# Patient Record
Sex: Male | Born: 1965 | Race: White | Hispanic: No | Marital: Married
Health system: Southern US, Academic
[De-identification: ages and names within clinical notes are randomized; demographics above are authoritative.]

## PROBLEM LIST (undated history)

## (undated) ENCOUNTER — Telehealth
Attending: Student in an Organized Health Care Education/Training Program | Primary: Student in an Organized Health Care Education/Training Program

## (undated) ENCOUNTER — Ambulatory Visit: Payer: PRIVATE HEALTH INSURANCE

## (undated) ENCOUNTER — Encounter: Attending: Oncology | Primary: Oncology

## (undated) ENCOUNTER — Encounter

## (undated) ENCOUNTER — Ambulatory Visit
Payer: PRIVATE HEALTH INSURANCE | Attending: Student in an Organized Health Care Education/Training Program | Primary: Student in an Organized Health Care Education/Training Program

## (undated) ENCOUNTER — Telehealth

## (undated) ENCOUNTER — Encounter: Attending: Adult Health | Primary: Adult Health

## (undated) ENCOUNTER — Encounter
Attending: Pharmacist Clinician (PhC)/ Clinical Pharmacy Specialist | Primary: Pharmacist Clinician (PhC)/ Clinical Pharmacy Specialist

## (undated) ENCOUNTER — Ambulatory Visit

## (undated) ENCOUNTER — Telehealth: Attending: Oncology | Primary: Oncology

## (undated) ENCOUNTER — Encounter: Attending: Nurse Practitioner | Primary: Nurse Practitioner

## (undated) ENCOUNTER — Ambulatory Visit: Payer: PRIVATE HEALTH INSURANCE | Attending: Registered" | Primary: Registered"

## (undated) ENCOUNTER — Ambulatory Visit: Attending: Radiation Oncology | Primary: Radiation Oncology

## (undated) ENCOUNTER — Encounter
Attending: Student in an Organized Health Care Education/Training Program | Primary: Student in an Organized Health Care Education/Training Program

## (undated) ENCOUNTER — Encounter: Attending: Family | Primary: Family

## (undated) ENCOUNTER — Inpatient Hospital Stay

## (undated) ENCOUNTER — Encounter: Attending: Primary Care | Primary: Primary Care

## (undated) ENCOUNTER — Encounter: Attending: Radiation Oncology | Primary: Radiation Oncology

## (undated) ENCOUNTER — Ambulatory Visit: Payer: PRIVATE HEALTH INSURANCE | Attending: Oncology | Primary: Oncology

## (undated) ENCOUNTER — Encounter: Attending: Diagnostic Radiology | Primary: Diagnostic Radiology

## (undated) ENCOUNTER — Ambulatory Visit: Attending: Clinical | Primary: Clinical

## (undated) ENCOUNTER — Ambulatory Visit: Attending: Hematology & Oncology | Primary: Hematology & Oncology

## (undated) ENCOUNTER — Ambulatory Visit: Payer: PRIVATE HEALTH INSURANCE | Attending: Family | Primary: Family

## (undated) ENCOUNTER — Encounter: Attending: Medical Oncology | Primary: Medical Oncology

## (undated) ENCOUNTER — Ambulatory Visit: Payer: PRIVATE HEALTH INSURANCE | Attending: Primary Care | Primary: Primary Care

## (undated) ENCOUNTER — Telehealth: Attending: Radiation Oncology | Primary: Radiation Oncology

## (undated) ENCOUNTER — Inpatient Hospital Stay: Payer: PRIVATE HEALTH INSURANCE

## (undated) ENCOUNTER — Telehealth: Attending: Primary Care | Primary: Primary Care

## (undated) ENCOUNTER — Ambulatory Visit: Payer: PRIVATE HEALTH INSURANCE | Attending: Radiation Oncology | Primary: Radiation Oncology

## (undated) DIAGNOSIS — K4090 Unilateral inguinal hernia, without obstruction or gangrene, not specified as recurrent: Secondary | ICD-10-CM

## (undated) DIAGNOSIS — N400 Enlarged prostate without lower urinary tract symptoms: Secondary | ICD-10-CM

## (undated) DIAGNOSIS — Z9221 Personal history of antineoplastic chemotherapy: Secondary | ICD-10-CM

## (undated) DIAGNOSIS — F32A Depression, unspecified: Secondary | ICD-10-CM

## (undated) DIAGNOSIS — F419 Anxiety disorder, unspecified: Secondary | ICD-10-CM

## (undated) DIAGNOSIS — C8590 Non-Hodgkin lymphoma, unspecified, unspecified site: Secondary | ICD-10-CM

## (undated) DIAGNOSIS — K219 Gastro-esophageal reflux disease without esophagitis: Secondary | ICD-10-CM

## (undated) DIAGNOSIS — Z923 Personal history of irradiation: Secondary | ICD-10-CM

## (undated) HISTORY — PX: CRANIOTOMY: SHX93

---

## 2002-09-30 ENCOUNTER — Encounter: Payer: Self-pay | Admitting: Specialist

## 2002-09-30 ENCOUNTER — Encounter: Admission: RE | Admit: 2002-09-30 | Discharge: 2002-09-30 | Payer: Self-pay | Admitting: Specialist

## 2016-11-04 ENCOUNTER — Other Ambulatory Visit (HOSPITAL_COMMUNITY): Payer: Self-pay | Admitting: Preventative Medicine

## 2016-11-04 DIAGNOSIS — R319 Hematuria, unspecified: Secondary | ICD-10-CM

## 2016-11-06 ENCOUNTER — Ambulatory Visit (HOSPITAL_COMMUNITY)
Admission: RE | Admit: 2016-11-06 | Discharge: 2016-11-06 | Disposition: A | Discharge status: Home / Self Care | Payer: Self-pay | Source: Ambulatory Visit | Attending: Preventative Medicine | Admitting: Preventative Medicine

## 2016-11-06 DIAGNOSIS — N400 Enlarged prostate without lower urinary tract symptoms: Secondary | ICD-10-CM | POA: Insufficient documentation

## 2016-11-06 DIAGNOSIS — R319 Hematuria, unspecified: Secondary | ICD-10-CM | POA: Insufficient documentation

## 2016-11-06 DIAGNOSIS — R1031 Right lower quadrant pain: Secondary | ICD-10-CM | POA: Insufficient documentation

## 2016-11-06 DIAGNOSIS — I700 Atherosclerosis of aorta: Secondary | ICD-10-CM | POA: Insufficient documentation

## 2016-11-06 DIAGNOSIS — K4090 Unilateral inguinal hernia, without obstruction or gangrene, not specified as recurrent: Secondary | ICD-10-CM | POA: Insufficient documentation

## 2016-11-06 DIAGNOSIS — E278 Other specified disorders of adrenal gland: Secondary | ICD-10-CM | POA: Insufficient documentation

## 2017-01-21 ENCOUNTER — Ambulatory Visit (INDEPENDENT_AMBULATORY_CARE_PROVIDER_SITE_OTHER): Payer: No Typology Code available for payment source | Admitting: Urology

## 2017-01-21 DIAGNOSIS — R3129 Other microscopic hematuria: Secondary | ICD-10-CM | POA: Diagnosis not present

## 2017-01-28 ENCOUNTER — Other Ambulatory Visit: Payer: Self-pay | Admitting: Urology

## 2017-01-28 DIAGNOSIS — R3129 Other microscopic hematuria: Secondary | ICD-10-CM

## 2017-02-27 ENCOUNTER — Ambulatory Visit (HOSPITAL_COMMUNITY): Payer: No Typology Code available for payment source

## 2017-03-12 ENCOUNTER — Ambulatory Visit (HOSPITAL_COMMUNITY): Payer: No Typology Code available for payment source

## 2017-03-31 ENCOUNTER — Ambulatory Visit (HOSPITAL_COMMUNITY): Payer: No Typology Code available for payment source

## 2017-04-29 ENCOUNTER — Ambulatory Visit: Payer: Self-pay | Admitting: Urology

## 2017-05-15 ENCOUNTER — Ambulatory Visit (HOSPITAL_COMMUNITY): Payer: Self-pay

## 2017-06-10 ENCOUNTER — Ambulatory Visit (HOSPITAL_COMMUNITY): Payer: Self-pay

## 2017-08-19 ENCOUNTER — Ambulatory Visit (HOSPITAL_COMMUNITY): Admission: RE | Admit: 2017-08-19 | Payer: Self-pay | Source: Ambulatory Visit

## 2017-10-28 ENCOUNTER — Ambulatory Visit: Payer: BLUE CROSS/BLUE SHIELD | Admitting: Urology

## 2017-11-06 ENCOUNTER — Ambulatory Visit (HOSPITAL_COMMUNITY): Payer: BLUE CROSS/BLUE SHIELD

## 2017-11-10 ENCOUNTER — Ambulatory Visit (HOSPITAL_COMMUNITY)
Admission: RE | Admit: 2017-11-10 | Discharge: 2017-11-10 | Disposition: A | Discharge status: Home / Self Care | Payer: BLUE CROSS/BLUE SHIELD | Source: Ambulatory Visit | Attending: Urology | Admitting: Urology

## 2017-11-10 ENCOUNTER — Ambulatory Visit (HOSPITAL_COMMUNITY): Payer: BLUE CROSS/BLUE SHIELD

## 2017-11-10 DIAGNOSIS — N400 Enlarged prostate without lower urinary tract symptoms: Secondary | ICD-10-CM | POA: Diagnosis not present

## 2017-11-10 DIAGNOSIS — K4090 Unilateral inguinal hernia, without obstruction or gangrene, not specified as recurrent: Secondary | ICD-10-CM | POA: Insufficient documentation

## 2017-11-10 DIAGNOSIS — R3129 Other microscopic hematuria: Secondary | ICD-10-CM | POA: Insufficient documentation

## 2017-11-10 MED ORDER — IOPAMIDOL (ISOVUE-300) INJECTION 61%
100.0000 mL | Freq: Once | INTRAVENOUS | Status: AC | PRN
  Administered 2017-11-10: 125 mL via INTRAVENOUS

## 2017-11-11 ENCOUNTER — Ambulatory Visit: Payer: BLUE CROSS/BLUE SHIELD | Admitting: Urology

## 2017-11-11 DIAGNOSIS — R3129 Other microscopic hematuria: Secondary | ICD-10-CM | POA: Diagnosis not present

## 2018-08-11 ENCOUNTER — Ambulatory Visit (INDEPENDENT_AMBULATORY_CARE_PROVIDER_SITE_OTHER): Payer: Self-pay | Admitting: Urology

## 2018-08-11 DIAGNOSIS — N5201 Erectile dysfunction due to arterial insufficiency: Secondary | ICD-10-CM

## 2018-08-11 DIAGNOSIS — R3129 Other microscopic hematuria: Secondary | ICD-10-CM

## 2019-05-27 IMAGING — CT CT ABD-PEL WO/W CM
3 of 12 series · 12 of 46 positions shown, 18 images · IV contrast (iopamidol)
Comparison: 11/06/2016

CLINICAL DATA: Left-sided flank pain with microscopic hematuria for
1.5 years.

EXAM:
CT ABDOMEN AND PELVIS WITHOUT AND WITH CONTRAST
TECHNIQUE: Multidetector CT imaging of the abdomen and pelvis was performed
following the standard protocol before and following the bolus
administration of intravenous contrast.
CONTRAST:  125mL [id number] IOPAMIDOL ([id number]) INJECTION 61%

[Series 3: axial pre · axial · non-contrast · 0.74mm/px · z∈[-434,-49]mm · 7 of 103 slices shown, 12 images]
[im 13/103  soft-tissue]
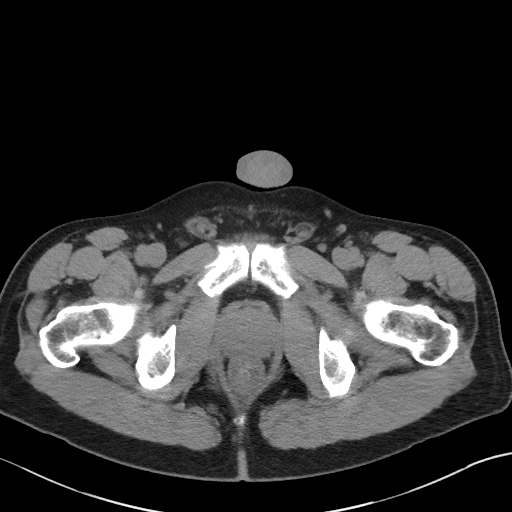
[im 13/103  bone]
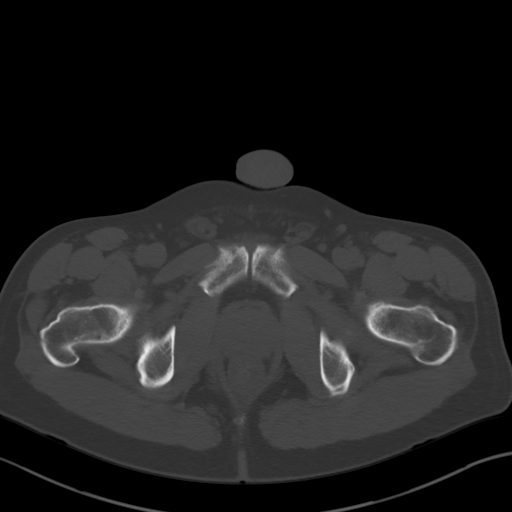
[im 26/103  soft-tissue]
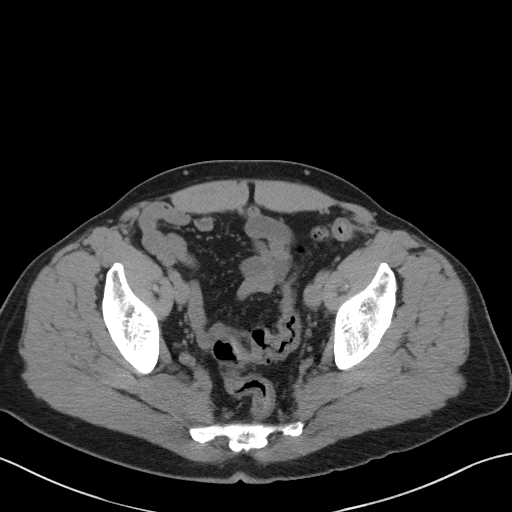
[im 39/103  soft-tissue]
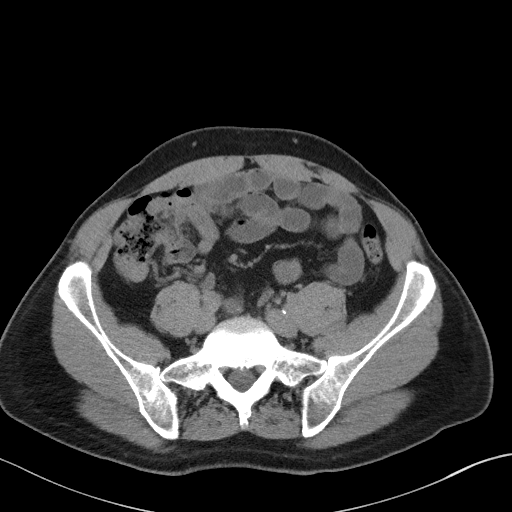
[im 52/103  soft-tissue]
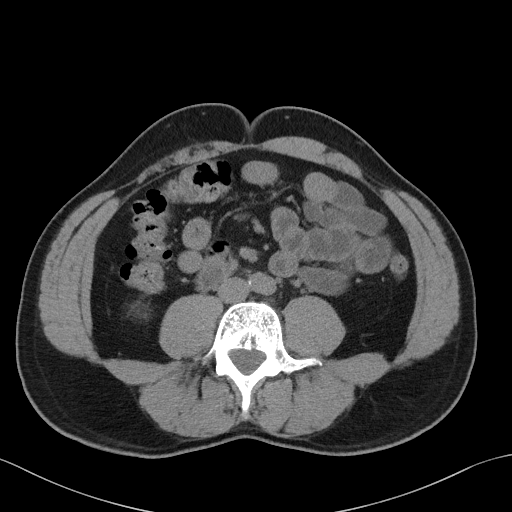
[im 52/103  lung]
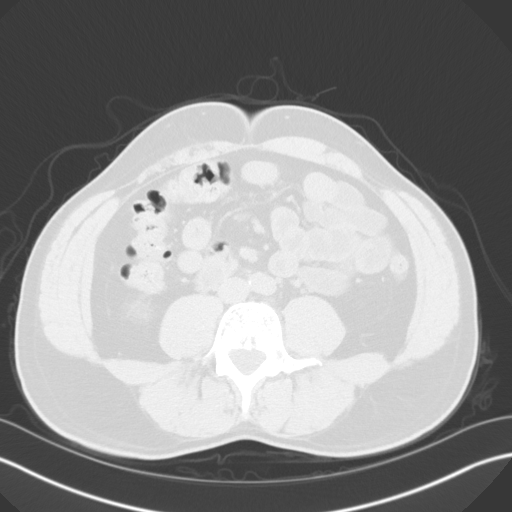
[im 64/103  soft-tissue]
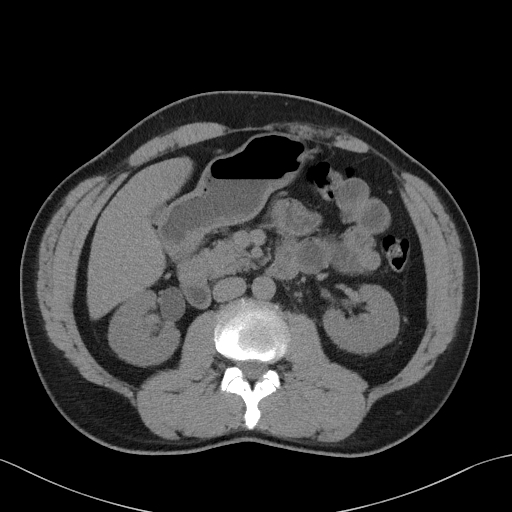
[im 64/103  lung]
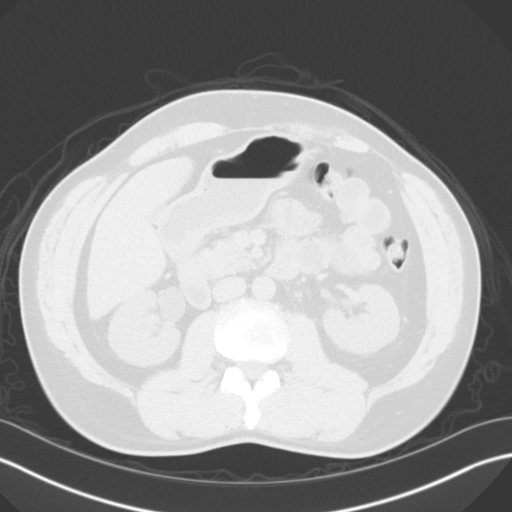
[im 77/103  soft-tissue]
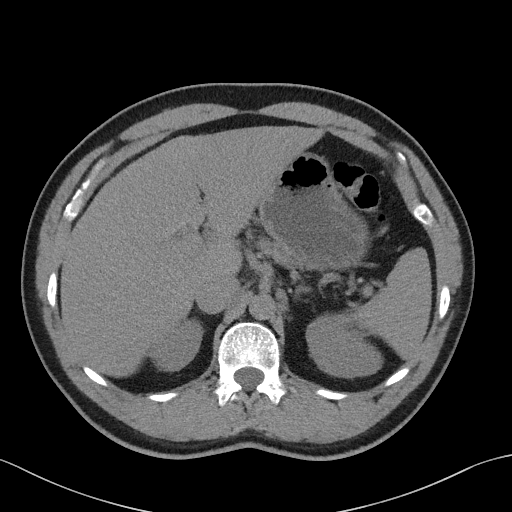
[im 77/103  lung]
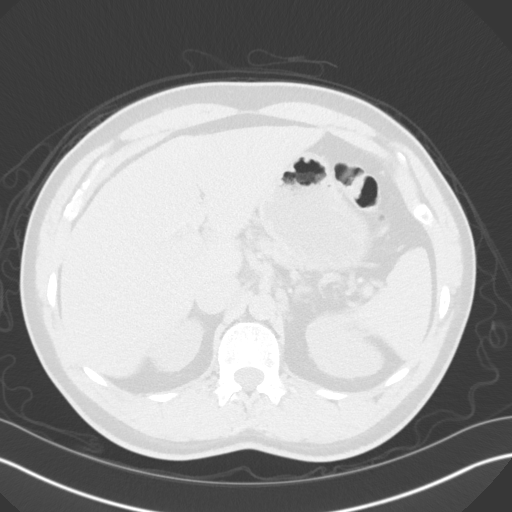
[im 90/103  soft-tissue]
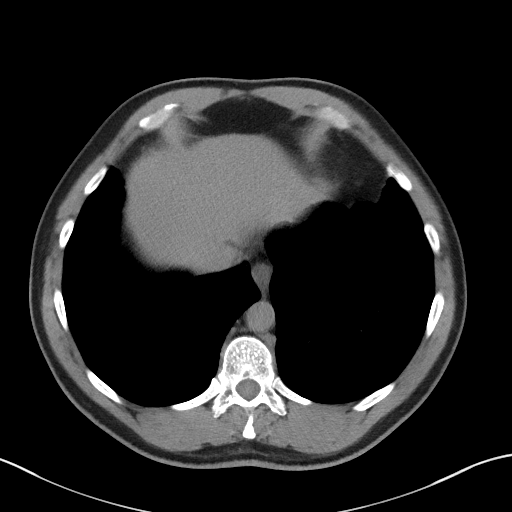
[im 90/103  lung]
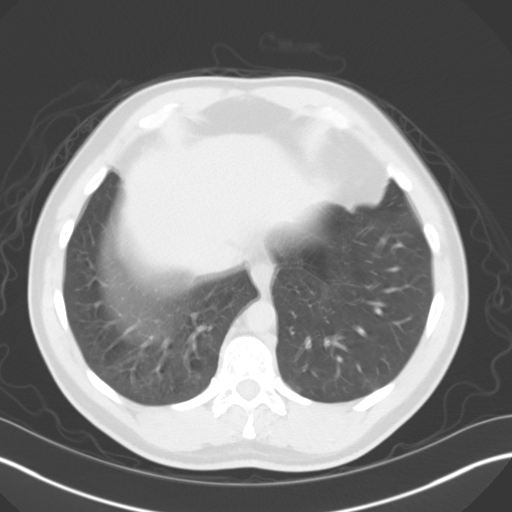

[Series 6: coronal pre · coronal · non-contrast · 0.74mm/px · 2 of 101 slices shown, 3 images]
[im 34/101  soft-tissue]
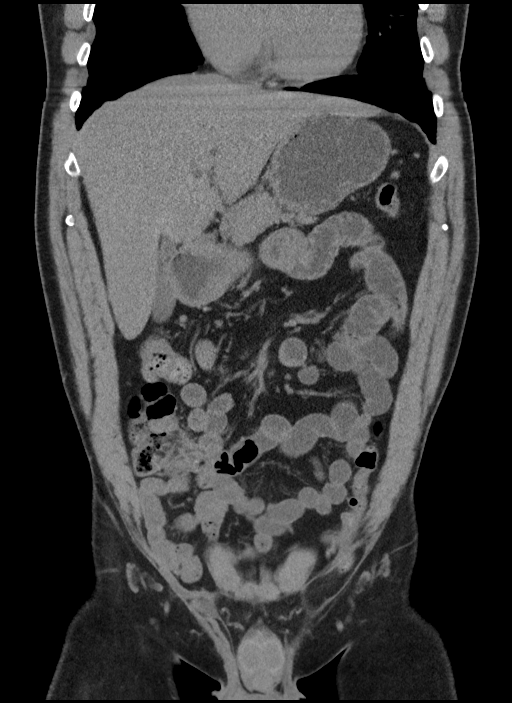
[im 34/101  bone]
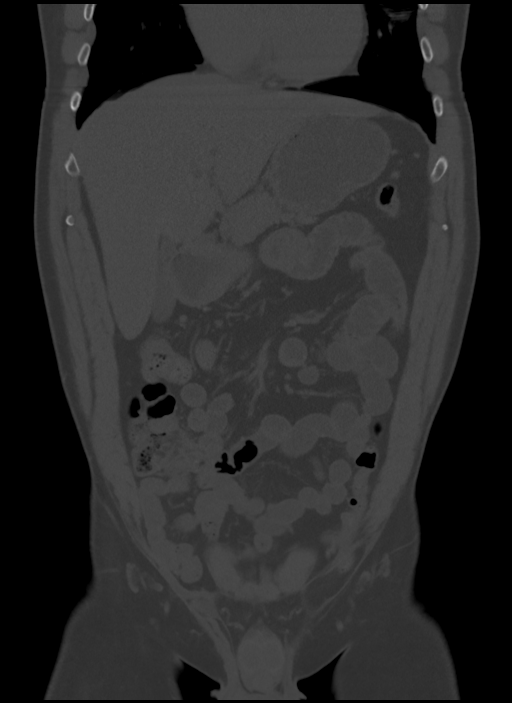
[im 67/101  soft-tissue]
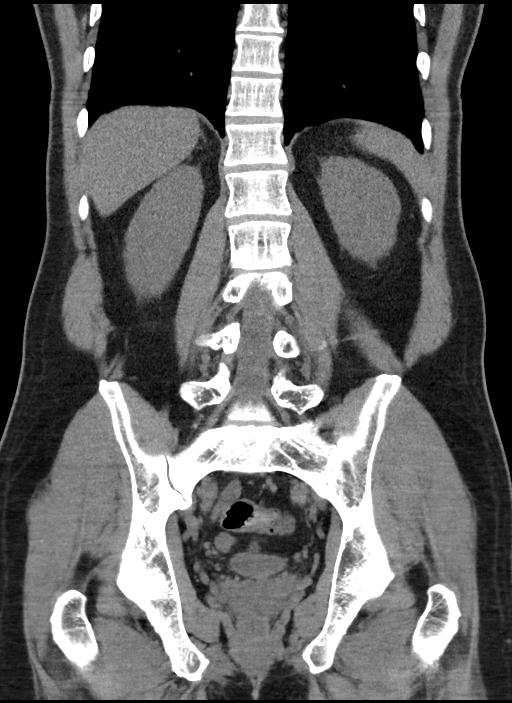

[Series 8: axial post · axial · 0.74mm/px · z∈[-424,-279]mm · 3 of 103 slices shown]
[im 15/103  soft-tissue]
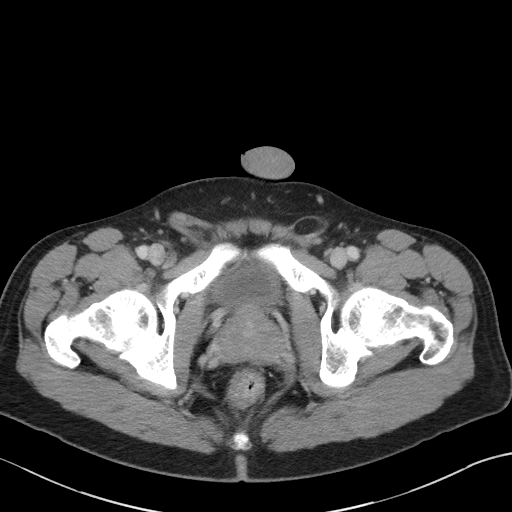
[im 30/103  soft-tissue]
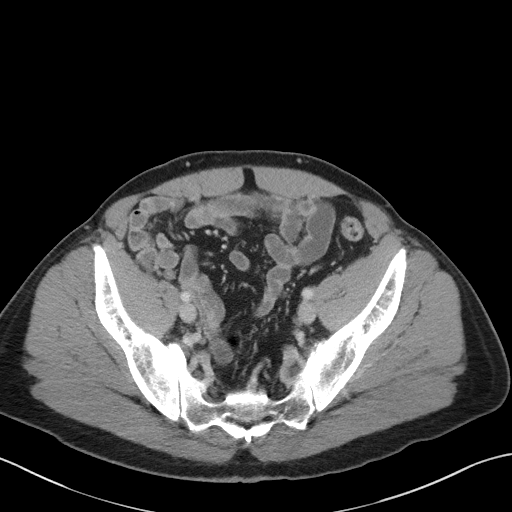
[im 44/103  soft-tissue]
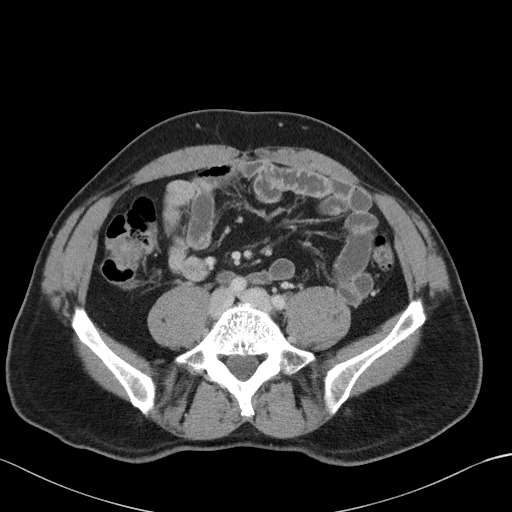

[12 of 46 positions shown; findings below may reference images not displayed]

FINDINGS: Lower chest: Scarring in the right middle lobe and lingula. No acute
abnormality identified.

Hepatobiliary: No focal liver abnormality is seen. No gallstones,
gallbladder wall thickening, or biliary dilatation.

Pancreas: Unremarkable. No pancreatic ductal dilatation or
surrounding inflammatory changes.

Spleen: Normal in size without focal abnormality.

Adrenals/Urinary Tract: Normal adrenal glands. The right kidney
appears normal. No right nephrolithiasis, hydronephrosis or mass. No
left renal calculi or hydronephrosis. Subcentimeter low-attenuation
foci within the left kidney are too small to reliably characterize.
Urinary bladder appears within normal limits.

Stomach/Bowel: The stomach appears normal. No abnormal dilatation of
the small bowel loops. Normal unremarkable appearance of the colon..

Vascular/Lymphatic: Mild aortic atherosclerosis. No aneurysm. No
abdominal adenopathy. No pelvic or inguinal adenopathy.

Reproductive: Prostate gland measures 5.3 by 3.9 by 4.1 cm (volume =
44 cm^3)

Other: Left inguinal hernia contains a nonobstructed loop of sigmoid
colon.

Musculoskeletal: No acute or significant osseous findings.
IMPRESSION: 1. No acute findings identified within the abdomen or pelvis.
2. No renal calculi or other explanation for patient's hematuria.
3. Prostate gland enlargement.
4. Left inguinal hernia contains a nonobstructed loop of sigmoid
colon.

## 2019-06-08 ENCOUNTER — Ambulatory Visit: Payer: Self-pay | Admitting: Urology

## 2019-08-03 ENCOUNTER — Ambulatory Visit: Payer: Self-pay | Admitting: Urology

## 2019-09-07 ENCOUNTER — Ambulatory Visit (INDEPENDENT_AMBULATORY_CARE_PROVIDER_SITE_OTHER): Payer: Self-pay | Admitting: Urology

## 2019-09-07 DIAGNOSIS — R3121 Asymptomatic microscopic hematuria: Secondary | ICD-10-CM

## 2019-09-07 DIAGNOSIS — N529 Male erectile dysfunction, unspecified: Secondary | ICD-10-CM

## 2020-05-22 ENCOUNTER — Ambulatory Visit
Admission: EM | Admit: 2020-05-22 | Discharge: 2020-05-22 | Disposition: A | Discharge status: Home / Self Care | Payer: Self-pay

## 2020-05-22 ENCOUNTER — Ambulatory Visit (INDEPENDENT_AMBULATORY_CARE_PROVIDER_SITE_OTHER): Payer: Self-pay

## 2020-05-22 DIAGNOSIS — R252 Cramp and spasm: Secondary | ICD-10-CM

## 2020-05-22 DIAGNOSIS — R0602 Shortness of breath: Secondary | ICD-10-CM

## 2020-05-22 DIAGNOSIS — R059 Cough, unspecified: Secondary | ICD-10-CM

## 2020-05-22 DIAGNOSIS — R05 Cough: Secondary | ICD-10-CM

## 2020-05-22 NOTE — Discharge Instructions (Signed)
Unable to rule out cardiac disease or blood clot in urgent care setting.  Offered patient further evaluation and management in the ED.  Patient declines at this time and would like to try outpatient therapy first.  Aware of the risk associated with this decision including missed diagnosis, organ damage, organ failure, and/or death.  Patient aware and in agreement.     Blood work ordered.  We will follow up with you regarding abnormal results More than likely you will need to follow up with a PCP regarding abnormal lab results Chest x-ray without active cardiopulmonary disease, but did show COPD and aortic atherosclerosis EKG unremarkable Follow up with PCP for further evaluation and management Call 911 or go to the ED if you chest pain, worsening SOB, calf pain, redness, swelling, abdominal pain, etc..Marland Kitchen

## 2020-05-22 NOTE — ED Provider Notes (Signed)
[place]   [id number] 05/22/20 Arrival Time: 17   CC: Multiple complaints  SUBJECTIVE: HPI obtained from patient and family friend: Kaylub Detienne is a 54 y.o. male who presents with complaint of multiple complaints, including bilateral leg cramps, SOB, and cough x 2 weeks.  Denies a precipitating event.  Denies chest pain.  Describes symptoms as worsening.  Has tried OTC cough medications without relief.  Symptoms made worse with laying flat and at night time.  Denies previous symptoms in the past.  Reports hx of blood with straining BMs.  Denies fever, chills, lightheadedness, dizziness,  nausea, vomiting, abdominal pain, changes in bladder habits, peripheral edema, LE skin changes.  Reports calf pain, SOB, and tobacco abuse 3PPD x 15 years.  Denies recent long travel, recent surgery, malignancy, hormone use, or previous blood clot  ROS: As per HPI.  All other pertinent ROS negative.    History reviewed. No pertinent past medical history. History reviewed. No pertinent surgical history. No Known Allergies No current facility-administered medications on file prior to encounter.   Current Outpatient Medications on File Prior to Encounter  Medication Sig Dispense Refill  . finasteride (PROSCAR) 5 MG tablet Take 5 mg by mouth daily.    . sildenafil (REVATIO) 20 MG tablet Take 20 mg by mouth as needed.     Social History   Socioeconomic History  . Marital status: Single    Spouse name: Not on file  . Number of children: Not on file  . Years of education: Not on file  . Highest education level: Not on file  Occupational History  . Not on file  Tobacco Use  . Smoking status: Heavy Tobacco Smoker    Packs/day: 3.00  . Smokeless tobacco: Never Used  Substance and Sexual Activity  . Alcohol use: Not Currently  . Drug use: Not Currently  . Sexual activity: Not on file  Other Topics Concern  . Not on file  Social History Narrative  . Not on file   Social  Determinants of Health   Financial Resource Strain:   . Difficulty of Paying Living Expenses:   Food Insecurity:   . Worried About Charity fundraiser in the Last Year:   . Arboriculturist in the Last Year:   Transportation Needs:   . Film/video editor (Medical):   Marland Kitchen Lack of Transportation (Non-Medical):   Physical Activity:   . Days of Exercise per Week:   . Minutes of Exercise per Session:   Stress:   . Feeling of Stress :   Social Connections:   . Frequency of Communication with Friends and Family:   . Frequency of Social Gatherings with Friends and Family:   . Attends Religious Services:   . Active Member of Clubs or Organizations:   . Attends Archivist Meetings:   Marland Kitchen Marital Status:   Intimate Partner Violence:   . Fear of Current or Ex-Partner:   . Emotionally Abused:   Marland Kitchen Physically Abused:   . Sexually Abused:    Family History  Family history unknown: Yes     OBJECTIVE:  Vitals:   05/22/20 1128  BP: 126/86  Pulse: 79  Resp: 20  Temp: 98.3 F (36.8 C)  SpO2: 94%    General appearance: alert; no distress Eyes: PERRLA; EOMI; conjunctiva normal HENT: normocephalic; atraumatic Neck: supple Lungs: clear to auscultation bilaterally without adventitious breath sounds Heart: regular rate and rhythm.  Clear S1 and S2 without rubs, gallops, or  murmur. Abdomen: soft, non-tender; bowel sounds normal; no guarding Extremities: no cyanosis or edema; symmetrical with no gross deformities; posterior tibialis pulse 2+ and intact; negative homans Skin: warm and dry Psychological: alert and cooperative; normal mood and affect  ECG: Orders placed or performed during the hospital encounter of 05/22/20  . ED EKG  . ED EKG   EKG normal sinus rhythm without ST elevations, depressions, or prolonged PR interval.  No narrowing or widening of the QRS complexes.    LABS:  No results found for this or any previous visit. Labs Reviewed  COMPREHENSIVE METABOLIC  PANEL    DIAGNOSTIC STUDIES:  DG Chest 2 View  Result Date: 05/22/2020 CLINICAL DATA:  Shortness of breath.  Cough. EXAM: CHEST - 2 VIEW COMPARISON:  CT of the abdomen and pelvis 11/10/2017 FINDINGS: Heart size normal. Atherosclerotic changes are noted at the aortic arch. Changes of COPD are noted. Linear atelectasis or scarring is present in the lingula. Lungs are otherwise clear. The visualized soft tissues and bony thorax are unremarkable. IMPRESSION: 1. No acute cardiopulmonary disease or significant interval change. 2. COPD. 3. Aortic atherosclerosis. Electronically Signed   By: San Morelle M.D.   On: 05/22/2020 12:13    X-rays negative for cardiopulmonary disease  I have reviewed the x-rays myself and the radiologist interpretation. I am in agreement with the radiologist interpretation.     ASSESSMENT & PLAN:  1. Leg cramps   2. SOB (shortness of breath)   3. Cough     Unable to rule out cardiac disease or blood clot in urgent care setting.  Offered patient further evaluation and management in the ED.  Patient declines at this time and would like to try outpatient therapy first.  Aware of the risk associated with this decision including missed diagnosis, organ damage, organ failure, and/or death.  Patient aware and in agreement.     Blood work ordered.  We will follow up with you regarding abnormal results More than likely you will need to follow up with a PCP regarding abnormal lab results Chest x-ray without active cardiopulmonary disease, but did show COPD and aortic atherosclerosis EKG unremarkable Follow up with PCP for further evaluation and management Call 911 or go to the ED if you chest pain, worsening SOB, calf pain, redness, swelling, abdominal pain, etc...   Lestine Box, PA-C 05/22/20 1237

## 2020-05-22 NOTE — ED Triage Notes (Signed)
Pt presents with c/o bilateral left leg pain for past couple of days. Pt speaks broken english and is poor historian

## 2020-05-24 LAB — COMPREHENSIVE METABOLIC PANEL
ALT: 20 IU/L (ref 0–44)
AST: 16 IU/L (ref 0–40)
Albumin/Globulin Ratio: 1.9 (ref 1.2–2.2)
Albumin: 5 g/dL — ABNORMAL HIGH (ref 3.8–4.9)
Alkaline Phosphatase: 62 IU/L (ref 48–121)
BUN/Creatinine Ratio: 12 (ref 9–20)
BUN: 11 mg/dL (ref 6–24)
Bilirubin Total: 0.8 mg/dL (ref 0.0–1.2)
CO2: 26 mmol/L (ref 20–29)
Calcium: 9.4 mg/dL (ref 8.7–10.2)
Chloride: 102 mmol/L (ref 96–106)
Creatinine, Ser: 0.91 mg/dL (ref 0.76–1.27)
GFR calc Af Amer: 112 mL/min/{1.73_m2} (ref >59)
GFR calc non Af Amer: 97 mL/min/{1.73_m2} (ref >59)
Globulin, Total: 2.6 g/dL (ref 1.5–4.5)
Glucose: 117 mg/dL — ABNORMAL HIGH (ref 65–99)
Potassium: 4.8 mmol/L (ref 3.5–5.2)
Sodium: 140 mmol/L (ref 134–144)
Total Protein: 7.6 g/dL (ref 6.0–8.5)

## 2020-06-01 ENCOUNTER — Emergency Department
Admit: 2020-06-01 | Discharge: 2020-06-02 | Disposition: A | Discharge status: Other Acute Inpatient Hospital | Payer: PRIVATE HEALTH INSURANCE | Attending: Emergency Medicine

## 2020-06-01 ENCOUNTER — Ambulatory Visit
Admit: 2020-06-01 | Discharge: 2020-06-02 | Disposition: A | Discharge status: Other Acute Inpatient Hospital | Payer: PRIVATE HEALTH INSURANCE | Attending: Emergency Medicine

## 2020-06-01 DIAGNOSIS — D496 Neoplasm of unspecified behavior of brain: Principal | ICD-10-CM

## 2020-07-30 DIAGNOSIS — C8589 Other specified types of non-Hodgkin lymphoma, extranodal and solid organ sites: Principal | ICD-10-CM

## 2020-08-03 ENCOUNTER — Ambulatory Visit: Admit: 2020-08-03 | Discharge: 2020-08-04

## 2020-08-03 DIAGNOSIS — C8589 Other specified types of non-Hodgkin lymphoma, extranodal and solid organ sites: Principal | ICD-10-CM

## 2020-08-07 ENCOUNTER — Ambulatory Visit: Admit: 2020-08-07 | Discharge: 2020-08-08

## 2020-08-07 ENCOUNTER — Encounter: Admit: 2020-08-07 | Discharge: 2020-08-07

## 2020-08-07 DIAGNOSIS — C8589 Other specified types of non-Hodgkin lymphoma, extranodal and solid organ sites: Principal | ICD-10-CM

## 2020-08-07 DIAGNOSIS — G9389 Other specified disorders of brain: Principal | ICD-10-CM

## 2020-08-09 ENCOUNTER — Other Ambulatory Visit: Admit: 2020-08-09 | Discharge: 2020-08-10

## 2020-08-09 ENCOUNTER — Ambulatory Visit: Admit: 2020-08-09 | Discharge: 2020-08-10

## 2020-08-09 DIAGNOSIS — C8589 Other specified types of non-Hodgkin lymphoma, extranodal and solid organ sites: Principal | ICD-10-CM

## 2020-09-03 ENCOUNTER — Ambulatory Visit: Admit: 2020-09-03 | Discharge: 2020-09-04

## 2020-09-03 DIAGNOSIS — C8589 Other specified types of non-Hodgkin lymphoma, extranodal and solid organ sites: Principal | ICD-10-CM

## 2020-09-03 DIAGNOSIS — G9389 Other specified disorders of brain: Principal | ICD-10-CM

## 2020-09-13 ENCOUNTER — Other Ambulatory Visit: Admit: 2020-09-13 | Discharge: 2020-09-14

## 2020-09-13 ENCOUNTER — Ambulatory Visit: Admit: 2020-09-13 | Discharge: 2020-09-14

## 2020-09-13 DIAGNOSIS — C8589 Other specified types of non-Hodgkin lymphoma, extranodal and solid organ sites: Principal | ICD-10-CM

## 2020-11-09 ENCOUNTER — Institutional Professional Consult (permissible substitution): Admit: 2020-11-09 | Discharge: 2020-11-09 | Payer: PRIVATE HEALTH INSURANCE

## 2020-11-09 ENCOUNTER — Ambulatory Visit: Admit: 2020-11-09 | Discharge: 2020-11-09 | Payer: PRIVATE HEALTH INSURANCE

## 2020-11-09 DIAGNOSIS — C8589 Other specified types of non-Hodgkin lymphoma, extranodal and solid organ sites: Principal | ICD-10-CM

## 2020-11-09 DIAGNOSIS — F17200 Nicotine dependence, unspecified, uncomplicated: Principal | ICD-10-CM

## 2020-11-19 DIAGNOSIS — Z7682 Awaiting organ transplant status: Principal | ICD-10-CM

## 2020-11-19 DIAGNOSIS — C8589 Other specified types of non-Hodgkin lymphoma, extranodal and solid organ sites: Principal | ICD-10-CM

## 2020-11-22 ENCOUNTER — Other Ambulatory Visit: Payer: Self-pay | Admitting: Urology

## 2020-11-26 DIAGNOSIS — Z7682 Awaiting organ transplant status: Principal | ICD-10-CM

## 2020-11-27 DIAGNOSIS — C8589 Other specified types of non-Hodgkin lymphoma, extranodal and solid organ sites: Principal | ICD-10-CM

## 2020-11-27 MED ORDER — SODIUM BICARBONATE 650 MG TABLET
ORAL_TABLET | 0 refills | 0 days | Status: CP
Start: 2020-11-27 — End: ?

## 2020-11-27 MED ORDER — ACETAZOLAMIDE 250 MG TABLET
ORAL_TABLET | 0 refills | 0 days | Status: CP
Start: 2020-11-27 — End: ?

## 2020-11-28 DIAGNOSIS — C8589 Other specified types of non-Hodgkin lymphoma, extranodal and solid organ sites: Principal | ICD-10-CM

## 2020-11-29 ENCOUNTER — Ambulatory Visit: Admit: 2020-11-29 | Discharge: 2020-11-30 | Payer: PRIVATE HEALTH INSURANCE

## 2020-11-29 DIAGNOSIS — C8589 Other specified types of non-Hodgkin lymphoma, extranodal and solid organ sites: Principal | ICD-10-CM

## 2020-11-30 ENCOUNTER — Ambulatory Visit
Admit: 2020-11-30 | Discharge: 2020-12-03 | Disposition: A | Discharge status: Home / Self Care | Payer: PRIVATE HEALTH INSURANCE | Admitting: Internal Medicine

## 2020-11-30 ENCOUNTER — Other Ambulatory Visit
Admit: 2020-11-30 | Discharge: 2020-12-03 | Disposition: A | Discharge status: Home / Self Care | Payer: PRIVATE HEALTH INSURANCE | Admitting: Internal Medicine

## 2020-12-03 MED ORDER — SODIUM BICARBONATE 650 MG TABLET
ORAL_TABLET | Freq: Four times a day (QID) | ORAL | 0 refills | 13 days | Status: CP
Start: 2020-12-03 — End: 2020-12-16
  Filled 2020-12-03: qty 100, 13d supply, fill #0

## 2020-12-03 MED ORDER — LEUCOVORIN CALCIUM 25 MG TABLET
ORAL_TABLET | Freq: Four times a day (QID) | ORAL | 0 refills | 2 days | Status: CP
Start: 2020-12-03 — End: 2020-12-05
  Filled 2020-12-03: qty 6, 2d supply, fill #0

## 2020-12-03 MED ORDER — VALACYCLOVIR 500 MG TABLET
ORAL_TABLET | Freq: Two times a day (BID) | ORAL | 0 refills | 30 days | Status: CP
Start: 2020-12-03 — End: 2021-01-02

## 2020-12-04 ENCOUNTER — Ambulatory Visit: Admit: 2020-12-04 | Discharge: 2021-01-03 | Payer: PRIVATE HEALTH INSURANCE

## 2020-12-04 ENCOUNTER — Ambulatory Visit
Admit: 2020-12-04 | Discharge: 2021-01-03 | Payer: PRIVATE HEALTH INSURANCE | Attending: Radiation Oncology | Primary: Radiation Oncology

## 2020-12-06 DIAGNOSIS — C8589 Other specified types of non-Hodgkin lymphoma, extranodal and solid organ sites: Principal | ICD-10-CM

## 2020-12-10 ENCOUNTER — Ambulatory Visit: Admit: 2020-12-10 | Discharge: 2020-12-10 | Payer: PRIVATE HEALTH INSURANCE

## 2020-12-10 ENCOUNTER — Institutional Professional Consult (permissible substitution): Admit: 2020-12-10 | Discharge: 2020-12-10 | Payer: PRIVATE HEALTH INSURANCE

## 2020-12-10 ENCOUNTER — Other Ambulatory Visit: Admit: 2020-12-10 | Discharge: 2020-12-10 | Payer: PRIVATE HEALTH INSURANCE

## 2020-12-10 DIAGNOSIS — C8589 Other specified types of non-Hodgkin lymphoma, extranodal and solid organ sites: Principal | ICD-10-CM

## 2020-12-10 DIAGNOSIS — Z7682 Awaiting organ transplant status: Principal | ICD-10-CM

## 2020-12-11 ENCOUNTER — Ambulatory Visit: Admit: 2020-12-11 | Discharge: 2020-12-11 | Payer: PRIVATE HEALTH INSURANCE

## 2020-12-11 ENCOUNTER — Ambulatory Visit: Admit: 2020-12-11 | Discharge: 2020-12-11 | Payer: PRIVATE HEALTH INSURANCE | Attending: Clinical | Primary: Clinical

## 2020-12-11 DIAGNOSIS — Z7689 Persons encountering health services in other specified circumstances: Principal | ICD-10-CM

## 2020-12-11 MED ORDER — AMOXICILLIN 500 MG CAPSULE
ORAL_CAPSULE | Freq: Once | ORAL | 0 refills | 1.00000 days | Status: CP
Start: 2020-12-11 — End: 2020-12-11

## 2020-12-13 ENCOUNTER — Institutional Professional Consult (permissible substitution): Admit: 2020-12-13 | Discharge: 2020-12-14 | Payer: PRIVATE HEALTH INSURANCE

## 2020-12-19 ENCOUNTER — Institutional Professional Consult (permissible substitution): Admit: 2020-12-19 | Discharge: 2020-12-20 | Payer: PRIVATE HEALTH INSURANCE

## 2020-12-19 ENCOUNTER — Ambulatory Visit: Admit: 2020-12-19 | Discharge: 2020-12-20 | Payer: PRIVATE HEALTH INSURANCE

## 2020-12-20 MED ORDER — PANTOPRAZOLE 20 MG TABLET,DELAYED RELEASE
ORAL_TABLET | Freq: Every day | ORAL | 0 refills | 30 days | Status: CP
Start: 2020-12-20 — End: 2021-12-20

## 2020-12-20 MED ORDER — VALACYCLOVIR 500 MG TABLET
ORAL_TABLET | Freq: Two times a day (BID) | ORAL | 0 refills | 30 days | Status: CP
Start: 2020-12-20 — End: 2021-01-19

## 2020-12-20 MED ORDER — LORAZEPAM 0.5 MG TABLET
ORAL_TABLET | Freq: Three times a day (TID) | ORAL | 0 refills | 20.00000 days | Status: CP | PRN
Start: 2020-12-20 — End: 2021-12-20

## 2020-12-20 MED ORDER — OLANZAPINE 5 MG TABLET
ORAL_TABLET | Freq: Every evening | ORAL | 0 refills | 30 days | Status: CP
Start: 2020-12-20 — End: ?

## 2020-12-21 DIAGNOSIS — C8330 Diffuse large B-cell lymphoma, unspecified site: Principal | ICD-10-CM

## 2020-12-26 ENCOUNTER — Ambulatory Visit: Admit: 2020-12-26 | Discharge: 2020-12-27 | Payer: PRIVATE HEALTH INSURANCE

## 2020-12-26 DIAGNOSIS — C8589 Other specified types of non-Hodgkin lymphoma, extranodal and solid organ sites: Principal | ICD-10-CM

## 2020-12-26 DIAGNOSIS — F17200 Nicotine dependence, unspecified, uncomplicated: Principal | ICD-10-CM

## 2020-12-26 DIAGNOSIS — R942 Abnormal results of pulmonary function studies: Principal | ICD-10-CM

## 2020-12-27 MED ORDER — HEPARIN, PORCINE (PF) 100 UNIT/ML INTRAVENOUS SYRINGE
0 refills | 14 days | Status: CP
Start: 2020-12-27 — End: 2021-01-10

## 2020-12-30 MED ORDER — FILGRASTIM-AAFI 480 MCG/0.8 ML SUBCUTANEOUS SYRINGE
Freq: Every day | SUBCUTANEOUS | 0 refills | 6 days | Status: CP
Start: 2020-12-30 — End: ?

## 2021-01-03 MED ORDER — MEMANTINE 5 MG-10 MG TABLETS IN A DOSE PACK
ORAL_TABLET | 12 refills | 0 days | Status: CP
Start: 2021-01-03 — End: 2022-01-03

## 2021-01-04 ENCOUNTER — Ambulatory Visit: Admit: 2021-01-04 | Discharge: 2021-02-02 | Payer: PRIVATE HEALTH INSURANCE

## 2021-01-04 ENCOUNTER — Ambulatory Visit
Admit: 2021-01-04 | Discharge: 2021-02-02 | Payer: PRIVATE HEALTH INSURANCE | Attending: Radiation Oncology | Primary: Radiation Oncology

## 2021-01-17 ENCOUNTER — Ambulatory Visit: Admit: 2021-01-17 | Discharge: 2021-01-18 | Attending: Radiation Oncology | Primary: Radiation Oncology

## 2021-01-18 ENCOUNTER — Ambulatory Visit: Admit: 2021-01-18 | Discharge: 2021-01-19

## 2021-01-18 MED ORDER — PANTOPRAZOLE 20 MG TABLET,DELAYED RELEASE
ORAL_TABLET | Freq: Two times a day (BID) | ORAL | 0 refills | 30 days | Status: CP
Start: 2021-01-18 — End: 2021-02-17

## 2021-01-29 ENCOUNTER — Ambulatory Visit: Admit: 2021-01-29 | Discharge: 2021-01-30

## 2021-02-01 ENCOUNTER — Other Ambulatory Visit: Payer: Self-pay

## 2021-02-01 DIAGNOSIS — N110 Nonobstructive reflux-associated chronic pyelonephritis: Secondary | ICD-10-CM

## 2021-02-02 ENCOUNTER — Other Ambulatory Visit: Payer: Self-pay

## 2021-02-02 ENCOUNTER — Encounter: Payer: Self-pay | Admitting: *Deleted

## 2021-02-02 ENCOUNTER — Ambulatory Visit
Admission: EM | Admit: 2021-02-02 | Discharge: 2021-02-02 | Disposition: A | Discharge status: Home / Self Care | Payer: 59

## 2021-02-02 DIAGNOSIS — H109 Unspecified conjunctivitis: Secondary | ICD-10-CM

## 2021-02-02 DIAGNOSIS — H5789 Other specified disorders of eye and adnexa: Secondary | ICD-10-CM

## 2021-02-02 HISTORY — DX: Anxiety disorder, unspecified: F41.9

## 2021-02-02 HISTORY — DX: Benign prostatic hyperplasia without lower urinary tract symptoms: N40.0

## 2021-02-02 HISTORY — DX: Depression, unspecified: F32.A

## 2021-02-02 HISTORY — DX: Personal history of antineoplastic chemotherapy: Z92.21

## 2021-02-02 HISTORY — DX: Personal history of irradiation: Z92.3

## 2021-02-02 HISTORY — DX: Non-Hodgkin lymphoma, unspecified, unspecified site: C85.90

## 2021-02-02 HISTORY — DX: Gastro-esophageal reflux disease without esophagitis: K21.9

## 2021-02-02 HISTORY — DX: Unilateral inguinal hernia, without obstruction or gangrene, not specified as recurrent: K40.90

## 2021-02-02 MED ORDER — POLYMYXIN B-TRIMETHOPRIM 10000-0.1 UNIT/ML-% OP SOLN: OPHTHALMIC | 0 refills | Status: AC

## 2021-02-02 NOTE — ED Triage Notes (Signed)
Pt reports left eye irritation onset 12 days ago.  Redness noted.  C/O left eye photosensitivity and crusting/matting.

## 2021-02-02 NOTE — ED Provider Notes (Signed)
[facility]   [id number] 02/02/21 Arrival Time: 9211  CC: Red eye  SUBJECTIVE:  Thomas Valentine is a 55 y.o. male who presents with complaint of LT eye redness that began 12 days ago.  Denies a precipitating event, trauma, or close contacts with similar symptoms.  Does admit to having radiation therapy for brain cancer 1-2 days prior to symptoms.  Has tried OTC eye drops with minimal relief.  Symptoms are made worse with light, and reports matting/ nasty drainage in the morning.  Complains of associated itching as well.  Denies fever, chills, nausea, vomiting, eye pain, painful eye movements, vision changes, double vision, FB sensation, periorbital erythema.     ROS: As per HPI.  All other pertinent ROS negative.     Past Medical History:  Diagnosis Date  . Anxiety   . Depression   . Enlarged prostate   . GERD (gastroesophageal reflux disease)   . History of chemotherapy   . History of radiation therapy   . Inguinal hernia   . 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 CRANIOTOMY     Allergies  Allergen Reactions  . Sulfa Antibiotics Rash   No current facility-administered medications on file prior to encounter.   Current Outpatient Medications on File Prior to Encounter  Medication Sig Dispense Refill  . DAPSONE PO Take by mouth.    . finasteride (PROSCAR) 5 MG tablet Take 1 tablet by mouth once daily 90 tablet 0  . MIRTAZAPINE PO Take by mouth.    . OLANZapine (ZYPREXA PO) Take by mouth.    . Pantoprazole Sodium (PROTONIX PO) Take by mouth.    Marland Kitchen UNKNOWN TO PATIENT An SSRI    . VALACYCLOVIR HCL PO Take by mouth.    . sildenafil (REVATIO) 20 MG tablet Take 20 mg by mouth as needed.     Social History   Socioeconomic History  . Marital status: Single    Spouse name: Not on file  . Number of children: Not on file  . Years of education: Not on file  . Highest education level: Not on file  Occupational History   . Not on file  Tobacco Use  . Smoking status: Current Every Day Smoker    Packs/day: 3.00  . Smokeless tobacco: Never Used  Vaping Use  . Vaping Use: Never used  Substance and Sexual Activity  . Alcohol use: Not Currently  . Drug use: Not Currently  . Sexual activity: Not on file  Other Topics Concern  . Not on file  Social History Narrative  . Not on file   Social Determinants of Health   Financial Resource Strain: Not on file  Food Insecurity: Not on file  Transportation Needs: Not on file  Physical Activity: Not on file  Stress: Not on file  Social Connections: Not on file  Intimate Partner Violence: Not on file   Family History  Family history unknown: Yes    OBJECTIVE:    Visual Acuity  Right Eye Distance: 20/60 states normally wears glasses; does not have today Left Eye Distance: 20/30 states normally wears glasses; does not have today Bilateral Distance: 20/25 -1 states normally uses glasses; does not have today  Vitals:   02/02/21 1320 02/02/21 1349  BP: 139/88   Pulse: 79   Resp: 18   Temp: 98.4 F (36.9 C)   TempSrc: Oral   SpO2: 92%   Weight:  184 lb (  83.5 kg)    General appearance: alert; no distress HENT: NCAT; EACs clear, TMs pearly gray; nares patent; oropharynx clear  Eyes: RT eye with trace conjunctival erythema, LT eye with moderate conjunctival erythema, stye formation on LT lower inner eyelid. PERRL; EOMI without discomfort;  Clear obvious drainage  Neck: supple Lungs: normal respiratory effort Skin: warm and dry Psychological: alert and cooperative; normal mood and affect   ASSESSMENT & PLAN:  1. Bacterial conjunctivitis of left eye   2. Redness of left eye     Meds ordered this encounter  Medications  . trimethoprim-polymyxin b (POLYTRIM) ophthalmic solution    Sig: instill 1 drop of polymyxin B sulfate and trimethoprim sulfate ophthalmic solution (polymyxin B 10000 units/trimethoprim 1 mg per mL) to affected eye(s) every 3  hours for 7 to 10 days    Dispense:  10 mL    Refill:  0    Order Specific Question:   Supervising Provider    Answer:   Raylene Everts [[id number]]   Use eye drops as prescribed and to completion Dispose of old contacts and wear glasses until you have finished course of antibiotic eye drops Wash pillow cases, wash hands regularly with soap and water, avoid touching your face and eyes, wash door handles, light switches, remotes and other objects you frequently touch Follow up with eye doctor if symptoms persists Go to the ED if you experience fever, chills, increased redness, swelling, eye pain, painful eye movements, vision changes, etc...   Reviewed expectations re: course of current medical issues. Questions answered. Outlined signs and symptoms indicating need for more acute intervention. Patient verbalized understanding. After Visit Summary given.   Lestine Box, PA-C 02/02/21 1356

## 2021-02-02 NOTE — Discharge Instructions (Signed)
Use eye drops as prescribed and to completion Dispose of old contacts and wear glasses until you have finished course of antibiotic eye drops Wash pillow cases, wash hands regularly with soap and water, avoid touching your face and eyes, wash door handles, light switches, remotes and other objects you frequently touch Follow up with eye doctor if symptoms persists Go to the ED if you experience fever, chills, increased redness, swelling, eye pain, painful eye movements, vision changes, etc..Marland Kitchen

## 2021-02-04 ENCOUNTER — Ambulatory Visit: Admit: 2021-02-04 | Discharge: 2021-02-05

## 2021-02-04 ENCOUNTER — Ambulatory Visit: Admit: 2021-02-04 | Discharge: 2021-03-05 | Payer: PRIVATE HEALTH INSURANCE

## 2021-02-04 DIAGNOSIS — C8589 Other specified types of non-Hodgkin lymphoma, extranodal and solid organ sites: Principal | ICD-10-CM

## 2021-02-04 MED ORDER — OLANZAPINE 5 MG TABLET
ORAL_TABLET | Freq: Every evening | ORAL | 0 refills | 30 days | Status: CP
Start: 2021-02-04 — End: ?

## 2021-02-10 MED ORDER — VALACYCLOVIR 500 MG TABLET
ORAL_TABLET | 0 refills | 0 days | Status: CP
Start: 2021-02-10 — End: ?

## 2021-02-14 MED ORDER — MEMANTINE 10 MG TABLET
ORAL_TABLET | Freq: Two times a day (BID) | ORAL | 0 refills | 210.00000 days | Status: CP
Start: 2021-02-14 — End: 2021-02-14

## 2021-02-15 ENCOUNTER — Ambulatory Visit: Admit: 2021-02-15 | Discharge: 2021-02-16 | Payer: PRIVATE HEALTH INSURANCE

## 2021-02-15 DIAGNOSIS — C8589 Other specified types of non-Hodgkin lymphoma, extranodal and solid organ sites: Principal | ICD-10-CM

## 2021-02-15 MED ORDER — LORAZEPAM 0.5 MG TABLET
ORAL_TABLET | Freq: Every day | ORAL | 0 refills | 30 days | Status: CP | PRN
Start: 2021-02-15 — End: 2022-02-15

## 2021-03-09 ENCOUNTER — Ambulatory Visit
Admission: EM | Admit: 2021-03-09 | Discharge: 2021-03-09 | Disposition: A | Discharge status: Home / Self Care | Payer: 59 | Attending: Emergency Medicine | Admitting: Emergency Medicine

## 2021-03-09 ENCOUNTER — Other Ambulatory Visit: Payer: Self-pay

## 2021-03-09 DIAGNOSIS — J441 Chronic obstructive pulmonary disease with (acute) exacerbation: Secondary | ICD-10-CM

## 2021-03-09 DIAGNOSIS — H65192 Other acute nonsuppurative otitis media, left ear: Secondary | ICD-10-CM

## 2021-03-09 MED ORDER — AZITHROMYCIN 250 MG PO TABS: 250.0000 mg | ORAL_TABLET | Freq: Every day | ORAL | 0 refills | Status: AC

## 2021-03-09 MED ORDER — BENZONATATE 100 MG PO CAPS: 100.0000 mg | ORAL_CAPSULE | Freq: Three times a day (TID) | ORAL | 0 refills | Status: AC

## 2021-03-09 MED ORDER — PREDNISONE 10 MG PO TABS: 20.0000 mg | ORAL_TABLET | Freq: Every day | ORAL | 0 refills | Status: AC

## 2021-03-09 NOTE — Discharge Instructions (Signed)
Get plenty of rest and push fluids Tessalon Perles prescribed for cough Azithromycin was prescribed Prednisone was prescribed/take as directed Continue OTC Flonase for middle ear effusion Use OTC flonase for nasal congestion and runny nose Use medications daily for symptom relief Use OTC medications like ibuprofen or tylenol as needed fever or pain Call or go to the ED if you have any new or worsening symptoms such as fever, worsening cough, shortness of breath, chest tightness, chest pain, turning blue, changes in mental status, etc..Marland Kitchen

## 2021-03-09 NOTE — ED Provider Notes (Signed)
[facility]   [id number] 03/09/21 Arrival Time: 1059  Chief Complaint  Patient presents with  . Ear Fullness     SUBJECTIVE: History from: patient and family.  Thomas Valentine is a 55 y.o. male who is a smoker and history of COPD presents to urgent care with a complaint of left ear fullness, congestion, cough with brown sputum for the past few weeks.  Denies sick exposure to COVID, flu or strep.  Denies recent travel.  Has tried OTC medications without relief.  Denies alleviating or aggravating factors.  Denies previous symptoms in the past.   Denies fever, chills, fatigue, sinus pain, rhinorrhea, sore throat, SOB, wheezing, chest pain, nausea, changes in bowel or bladder habits.    ROS: As per HPI.  All other pertinent ROS negative.     Past Medical History:  Diagnosis Date  . Anxiety   . Depression   . Enlarged prostate   . GERD (gastroesophageal reflux disease)   . History of chemotherapy   . History of radiation therapy   . Inguinal hernia   . 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 CRANIOTOMY     Allergies  Allergen Reactions  . Sulfa Antibiotics Rash   No current facility-administered medications on file prior to encounter.   Current Outpatient Medications on File Prior to Encounter  Medication Sig Dispense Refill  . DAPSONE PO Take by mouth.    . finasteride (PROSCAR) 5 MG tablet Take 1 tablet by mouth once daily 90 tablet 0  . MIRTAZAPINE PO Take by mouth.    . OLANZapine (ZYPREXA PO) Take by mouth.    . Pantoprazole Sodium (PROTONIX PO) Take by mouth.    . sildenafil (REVATIO) 20 MG tablet Take 20 mg by mouth as needed.    . trimethoprim-polymyxin b (POLYTRIM) ophthalmic solution instill 1 drop of polymyxin B sulfate and trimethoprim sulfate ophthalmic solution (polymyxin B 10000 units/trimethoprim 1 mg per mL) to affected eye(s) every 3 hours for 7 to 10 days 10 mL 0  . UNKNOWN TO PATIENT An SSRI     . VALACYCLOVIR HCL PO Take by mouth.     Social History   Socioeconomic History  . Marital status: Single    Spouse name: Not on file  . Number of children: Not on file  . Years of education: Not on file  . Highest education level: Not on file  Occupational History  . Not on file  Tobacco Use  . Smoking status: Current Every Day Smoker    Packs/day: 3.00  . Smokeless tobacco: Never Used  Vaping Use  . Vaping Use: Never used  Substance and Sexual Activity  . Alcohol use: Not Currently  . Drug use: Not Currently  . Sexual activity: Not on file  Other Topics Concern  . Not on file  Social History Narrative  . Not on file   Social Determinants of Health   Financial Resource Strain: Not on file  Food Insecurity: Not on file  Transportation Needs: Not on file  Physical Activity: Not on file  Stress: Not on file  Social Connections: Not on file  Intimate Partner Violence: Not on file   Family History  Family history unknown: Yes    OBJECTIVE:  Vitals:   03/09/21 1211  BP: 114/73  Pulse: 65  Resp: 20  Temp: 97.7 F (36.5 C)  SpO2: 95%     General appearance: alert; appears fatigued,  but nontoxic; speaking in full sentences and tolerating own secretions HEENT: NCAT; Ears: EACs clear, right TMs pearly gray, left TM with middle ear effusion; Eyes: PERRL.  EOM grossly intact. Sinuses: nontender; Nose: nares patent without rhinorrhea, Throat: oropharynx clear, tonsils non erythematous or enlarged, uvula midline  Neck: supple without LAD Lungs: unlabored respirations, symmetrical air entry; cough: moderate; no respiratory distress; CTAB Heart: regular rate and rhythm.  Radial pulses 2+ symmetrical bilaterally Skin: warm and dry Psychological: alert and cooperative; normal mood and affect  LABS:  No results found for this or any previous visit (from the past 24 hour(s)).   ASSESSMENT & PLAN:  1. COPD exacerbation ([place])   2. Acute middle ear effusion, left      Meds ordered this encounter  Medications  . benzonatate (TESSALON) 100 MG capsule    Sig: Take 1 capsule (100 mg total) by mouth every 8 (eight) hours.    Dispense:  21 capsule    Refill:  0  . predniSONE (DELTASONE) 10 MG tablet    Sig: Take 2 tablets (20 mg total) by mouth daily for 3 days.    Dispense:  6 tablet    Refill:  0  . azithromycin (ZITHROMAX) 250 MG tablet    Sig: Take 1 tablet (250 mg total) by mouth daily. Take first 2 tablets together, then 1 every day until finished.    Dispense:  6 tablet    Refill:  0    Discharge instructions  Get plenty of rest and push fluids Tessalon Perles prescribed for cough Azithromycin was prescribed Prednisone was prescribed/take as directed Continue OTC Flonase for middle ear effusion Use OTC flonase for nasal congestion and runny nose Use medications daily for symptom relief Use OTC medications like ibuprofen or tylenol as needed fever or pain Call or go to the ED if you have any new or worsening symptoms such as fever, worsening cough, shortness of breath, chest tightness, chest pain, turning blue, changes in mental status, etc...   Reviewed expectations re: course of current medical issues. Questions answered. Outlined signs and symptoms indicating need for more acute intervention. Patient verbalized understanding. After Visit Summary given.         Emerson Monte, FNP 03/09/21 1257

## 2021-03-09 NOTE — ED Triage Notes (Signed)
Pt presents with left ear fullness without pain but had dizziness with fall earlier today

## 2021-03-17 DIAGNOSIS — C8589 Other specified types of non-Hodgkin lymphoma, extranodal and solid organ sites: Principal | ICD-10-CM

## 2021-03-18 MED ORDER — OLANZAPINE 5 MG TABLET
ORAL_TABLET | Freq: Every evening | ORAL | 0 refills | 30 days | Status: CP
Start: 2021-03-18 — End: ?

## 2021-03-26 ENCOUNTER — Other Ambulatory Visit: Payer: Self-pay

## 2021-03-26 ENCOUNTER — Ambulatory Visit (HOSPITAL_COMMUNITY)
Admission: RE | Admit: 2021-03-26 | Discharge: 2021-03-26 | Disposition: A | Discharge status: Home / Self Care | Payer: 59 | Source: Ambulatory Visit | Attending: Urology | Admitting: Urology

## 2021-03-26 DIAGNOSIS — N110 Nonobstructive reflux-associated chronic pyelonephritis: Secondary | ICD-10-CM | POA: Diagnosis not present

## 2021-04-02 ENCOUNTER — Telehealth (INDEPENDENT_AMBULATORY_CARE_PROVIDER_SITE_OTHER): Payer: 59 | Admitting: Urology

## 2021-04-02 ENCOUNTER — Other Ambulatory Visit: Payer: Self-pay

## 2021-04-02 ENCOUNTER — Encounter: Payer: Self-pay | Admitting: Urology

## 2021-04-02 DIAGNOSIS — N110 Nonobstructive reflux-associated chronic pyelonephritis: Secondary | ICD-10-CM

## 2021-04-02 MED ORDER — FINASTERIDE 5 MG PO TABS: 5.0000 mg | ORAL_TABLET | Freq: Every day | ORAL | 3 refills | Status: DC

## 2021-04-02 MED ORDER — SILDENAFIL CITRATE 20 MG PO TABS: 20.0000 mg | ORAL_TABLET | ORAL | 11 refills | Status: AC | PRN

## 2021-04-02 NOTE — Patient Instructions (Signed)
Sildenafil Tablets (Erectile Dysfunction) What is this medication? SILDENAFIL (sil DEN a fil) treats erectile dysfunction (ED). It works byincreasing blood flow to the penis, which helps to maintain an erection. This medicine may be used for other purposes; ask your health care provider orpharmacist if you have questions. COMMON BRAND NAME(S): Viagra What should I tell my care team before I take this medication? They need to know if you have any of these conditions: Bleeding disorders Eye or vision problems, including a rare inherited eye disease called retinitis pigmentosa Anatomical deformation of the penis, Peyronie's disease, or history of priapism (painful and prolonged erection) Heart disease, angina, a history of heart attack, irregular heartbeats, or other heart problems High or low blood pressure History of blood diseases, like sickle cell anemia or leukemia History of stomach bleeding Kidney disease Liver disease Stroke An unusual or allergic reaction to sildenafil, other medications, foods, dyes, or preservatives Pregnant or trying to get pregnant Breast-feeding How should I use this medication? Take this medication by mouth with a glass of water. Follow the directions on the prescription label. The dose is usually taken 1 hour before sexual activity. You should not take the dose more than once per day. Do not take yourmedication more often than directed. Talk to your care team about the use of this medication in children. Thismedication is not used in children for this condition. Overdosage: If you think you have taken too much of this medicine contact apoison control center or emergency room at once. NOTE: This medicine is only for you. Do not share this medicine with others. What if I miss a dose? This does not apply. Do not take double or extra doses. What may interact with this medication? Do not take this medication with any of the following: Cisapride Nitrates like  amyl nitrite, isosorbide dinitrate, isosorbide mononitrate, nitroglycerin Riociguat This medication may also interact with the following: Antiviral medications for HIV or AIDS Bosentan Certain medications for benign prostatic hyperplasia (BPH) Certain medications for blood pressure Certain medications for fungal infections like ketoconazole and itraconazole Cimetidine Erythromycin Rifampin This list may not describe all possible interactions. Give your health care provider a list of all the medicines, herbs, non-prescription drugs, or dietary supplements you use. Also tell them if you smoke, drink alcohol, or use illegaldrugs. Some items may interact with your medicine. What should I watch for while using this medication? If you notice any changes in your vision while taking this medication, call your care team as soon as possible. Stop using this medication and call yourcare team right away if you have a loss of sight in one or both eyes. Contact your care team right away if you have an erection that lasts longer than 4 hours or if it becomes painful. This may be a sign of a serious problemand must be treated right away to prevent permanent damage. If you experience symptoms of nausea, dizziness, chest pain or arm pain upon initiation of sexual activity after taking this medication, you should refrainfrom further activity and call your care team as soon as possible. Do not drink alcohol to excess (examples, 5 glasses of wine or 5 shots of whiskey) when taking this medication. When taken in excess, alcohol can increase your chances of getting a headache or getting dizzy, increasing yourheart rate or lowering your blood pressure. Using this medication does not protect you or your partner against HIVinfection (the virus that causes AIDS) or other sexually transmitted diseases. What side effects may I notice  from receiving this medication? Side effects that you should report to your care team as soon  as possible: Allergic reactions-skin rash, itching, hives, swelling of the face, lips, tongue, or throat Hearing loss or ringing in ears Heart attack-pain or tightness in the chest, shoulders, arms, or jaw, nausea, shortness of breath, cold or clammy skin, feeling faint or lightheaded Heart rhythm changes-fast or irregular heartbeat, dizziness, feeling faint or lightheaded, chest pain, trouble breathing Low blood pressure-dizziness, feeling faint or lightheaded, blurry vision New or worsening shortness of breath Prolonged or painful erection Stroke-sudden numbness or weakness of the face, arm, or leg, trouble speaking, confusion, trouble walking, loss of balance or coordination, dizziness, severe headache, change in vision Sudden vision loss in one or both eyes Side effects that usually do not require medical attention (report to your careteam if they continue or are bothersome): Facial flushing or redness Headache Nosebleed Runny or stuffy nose Trouble sleeping Upset stomach This list may not describe all possible side effects. Call your doctor for medical advice about side effects. You may report side effects to FDA at1-800-FDA-1088. Where should I keep my medication? Keep out of reach of children and pets. Store at room temperature between 15 and 30 degrees C (59 and 86 degrees F).Throw away any unused medication after the expiration date. NOTE: This sheet is a summary. It may not cover all possible information. If you have questions about this medicine, talk to your doctor, pharmacist, orhealth care provider.  2022 Elsevier/Gold Standard (2020-10-25 13:57:21)

## 2021-04-02 NOTE — Progress Notes (Signed)
04/02/2021 4:05 PM   Thomas Valentine Aug 03, 1966 [id number]  Referring provider: No referring provider defined for this encounter.  Patient location: home Physician location: office I connected with  Thomas Valentine on 04/02/21 by a video enabled telemedicine application and verified that I am speaking with the correct person using two identifiers.   I discussed the limitations of evaluation and management by telemedicine. The patient expressed understanding and agreed to proceed.     HPI: Thomas Valentine is a 55yo here for followup for erectile dysfunction and gross hematuria. He was last seen December 2020. No gross hematuria episodes since he was last seen. He remains on finasteride 5mg  daily. No significant LUTS. 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 If you have any questions, ask your nurse or doctor.          azithromycin 250 MG tablet Commonly known as: ZITHROMAX Take 1 tablet (250 mg total) by mouth daily. Take first 2 tablets together, then 1 every day until finished.   benzonatate 100 MG capsule Commonly known as: TESSALON Take 1 capsule (100 mg total) by mouth every 8 (eight) hours.   DAPSONE PO Take by mouth.   finasteride 5 MG tablet Commonly known as: PROSCAR Take 1 tablet by mouth once daily   MIRTAZAPINE PO Take by mouth.   PROTONIX PO Take by mouth.   sildenafil 20 MG tablet Commonly known  as: REVATIO Take 20 mg by mouth as needed.   trimethoprim-polymyxin b ophthalmic solution Commonly known as: POLYTRIM instill 1 drop of polymyxin B sulfate and trimethoprim sulfate ophthalmic solution (polymyxin B 10000 units/trimethoprim 1 mg per mL) to affected eye(s) every 3 hours for 7 to 10 days   UNKNOWN TO PATIENT An SSRI   VALACYCLOVIR HCL PO Take by mouth.   ZYPREXA PO Take by mouth.        Allergies:  Allergies  Allergen Reactions   Sulfa Antibiotics Rash    Family History: Family History  Family history unknown: Yes    Social History:  reports that he has been smoking. He has been smoking an average of 3.00 packs per day. He has never used smokeless tobacco. He reports previous alcohol use. He reports previous drug use.  ROS: All other review of systems were reviewed and are negative except what is noted above in HPI   Laboratory Data: No results found for: WBC, HGB, HCT, MCV, PLT  Lab Results  Component Value Date   CREATININE 0.91 05/22/2020    No results found for: PSA  No results found for: TESTOSTERONE  No results found for: HGBA1C  Urinalysis No results found for: COLORURINE, APPEARANCEUR, LABSPEC, PHURINE, GLUCOSEU, HGBUR, BILIRUBINUR, KETONESUR, PROTEINUR, UROBILINOGEN, NITRITE, LEUKOCYTESUR  No results found for: [place], [place], RBCUA,  LABEPIT, MUCUS, BACTERIA  Pertinent Imaging:  No results found for this or any previous visit.  No results found for this or any previous visit.  No results found for this or any previous visit.  No results found for this or any previous visit.  Results for orders placed during the hospital encounter of 03/26/21  US RENAL  Narrative CLINICAL DATA:  Pyelonephritis.  EXAM: RENAL / URINARY TRACT ULTRASOUND COMPLETE  COMPARISON:  None.  FINDINGS: Right Kidney:  Renal measurements: 14.3 cm x 4.3 cm x 6.3 cm = volume: 204.8 mL. Echogenicity within normal limits. No mass is visualized.  Mild pelvic fullness is noted.  Left Kidney:  Renal measurements: 15.0 cm x 5.4 cm x 5.9 cm = volume: 246.4 mL. Echogenicity within normal limits. No mass or hydronephrosis visualized.  Bladder:  Appears normal for degree of bladder distention.  Other:  None.  IMPRESSION: 1. Mild fullness of the RIGHT renal pelvis. 2. Otherwise, unremarkable renal ultrasound.   Electronically Signed By: Virgina Norfolk M.D. On: 03/27/2021 13:08  No results found for this or any previous visit.  No results found for this or any previous visit.  No results found for this or any previous visit.   Assessment & Plan:    1. Gross hematuria -Continue finasteride 5mg  daily  2. Erectile dysfunction -Continue sildenafil 20mg  prn. RTC 1 year  No follow-ups on file.  Nicolette Bang, MD  [facility] [place]

## 2021-04-16 ENCOUNTER — Ambulatory Visit: Admit: 2021-04-16 | Discharge: 2021-04-17 | Payer: PRIVATE HEALTH INSURANCE

## 2021-04-16 DIAGNOSIS — J449 Chronic obstructive pulmonary disease, unspecified: Principal | ICD-10-CM

## 2021-04-16 DIAGNOSIS — F32A Anxiety and depression: Principal | ICD-10-CM

## 2021-04-16 DIAGNOSIS — Z125 Encounter for screening for malignant neoplasm of prostate: Principal | ICD-10-CM

## 2021-04-16 DIAGNOSIS — C8589 Other specified types of non-Hodgkin lymphoma, extranodal and solid organ sites: Principal | ICD-10-CM

## 2021-04-16 DIAGNOSIS — Z1211 Encounter for screening for malignant neoplasm of colon: Principal | ICD-10-CM

## 2021-04-16 DIAGNOSIS — G4700 Insomnia, unspecified: Principal | ICD-10-CM

## 2021-04-16 DIAGNOSIS — Z0000 Encounter for general adult medical examination without abnormal findings: Principal | ICD-10-CM

## 2021-04-16 DIAGNOSIS — J302 Other seasonal allergic rhinitis: Principal | ICD-10-CM

## 2021-04-16 DIAGNOSIS — N400 Enlarged prostate without lower urinary tract symptoms: Principal | ICD-10-CM

## 2021-04-16 DIAGNOSIS — Z720 Tobacco use: Principal | ICD-10-CM

## 2021-04-16 DIAGNOSIS — D709 Neutropenia, unspecified: Principal | ICD-10-CM

## 2021-04-16 DIAGNOSIS — R413 Other amnesia: Principal | ICD-10-CM

## 2021-04-16 DIAGNOSIS — F419 Anxiety disorder, unspecified: Principal | ICD-10-CM

## 2021-04-16 MED ORDER — OLANZAPINE 5 MG TABLET
ORAL_TABLET | Freq: Every evening | ORAL | 1 refills | 90 days | Status: CP
Start: 2021-04-16 — End: ?

## 2021-04-16 MED ORDER — MIRTAZAPINE 30 MG TABLET
ORAL_TABLET | Freq: Every evening | ORAL | 1 refills | 90.00000 days | Status: CP
Start: 2021-04-16 — End: 2022-04-16

## 2021-04-16 MED ORDER — MIRTAZAPINE 15 MG TABLET
ORAL_TABLET | Freq: Every evening | ORAL | 1 refills | 90 days | Status: CP
Start: 2021-04-16 — End: 2021-04-16

## 2021-04-16 NOTE — Progress Notes (Signed)
Results mailed 

## 2021-04-18 ENCOUNTER — Ambulatory Visit
Admit: 2021-04-18 | Discharge: 2021-05-05 | Payer: PRIVATE HEALTH INSURANCE | Attending: Radiation Oncology | Primary: Radiation Oncology

## 2021-04-18 ENCOUNTER — Ambulatory Visit: Admit: 2021-04-18 | Discharge: 2021-04-18 | Payer: PRIVATE HEALTH INSURANCE

## 2021-04-30 DIAGNOSIS — Z1211 Encounter for screening for malignant neoplasm of colon: Principal | ICD-10-CM

## 2021-05-06 DIAGNOSIS — J441 Chronic obstructive pulmonary disease with (acute) exacerbation: Principal | ICD-10-CM

## 2021-07-15 MED ORDER — ESCITALOPRAM 10 MG TABLET
ORAL_TABLET | Freq: Every day | ORAL | 0 refills | 45.00000 days | Status: CP
Start: 2021-07-15 — End: ?

## 2021-07-17 ENCOUNTER — Ambulatory Visit: Admit: 2021-07-17 | Discharge: 2021-07-18 | Payer: PRIVATE HEALTH INSURANCE

## 2021-07-17 DIAGNOSIS — F32A Anxiety and depression: Principal | ICD-10-CM

## 2021-07-17 DIAGNOSIS — J449 Chronic obstructive pulmonary disease, unspecified: Principal | ICD-10-CM

## 2021-07-17 DIAGNOSIS — E785 Hyperlipidemia, unspecified: Principal | ICD-10-CM

## 2021-07-17 DIAGNOSIS — Z1211 Encounter for screening for malignant neoplasm of colon: Principal | ICD-10-CM

## 2021-07-17 DIAGNOSIS — F419 Anxiety disorder, unspecified: Principal | ICD-10-CM

## 2021-07-17 DIAGNOSIS — C8589 Other specified types of non-Hodgkin lymphoma, extranodal and solid organ sites: Principal | ICD-10-CM

## 2021-07-17 DIAGNOSIS — R7309 Other abnormal glucose: Principal | ICD-10-CM

## 2021-07-17 DIAGNOSIS — Z720 Tobacco use: Principal | ICD-10-CM

## 2021-07-17 MED ORDER — MIRTAZAPINE 30 MG TABLET
ORAL_TABLET | Freq: Every evening | ORAL | 1 refills | 90 days | Status: CP
Start: 2021-07-17 — End: 2022-07-17

## 2021-07-17 MED ORDER — OLANZAPINE 5 MG TABLET
ORAL_TABLET | Freq: Every evening | ORAL | 1 refills | 90 days | Status: CP
Start: 2021-07-17 — End: ?

## 2021-07-17 MED ORDER — ESCITALOPRAM 20 MG TABLET
ORAL_TABLET | Freq: Every day | ORAL | 2 refills | 90 days | Status: CP
Start: 2021-07-17 — End: ?

## 2021-07-28 MED ORDER — ATORVASTATIN 20 MG TABLET
ORAL_TABLET | Freq: Every day | ORAL | 3 refills | 90.00000 days | Status: CP
Start: 2021-07-28 — End: 2022-07-28

## 2021-08-03 ENCOUNTER — Ambulatory Visit: Admit: 2021-08-03 | Discharge: 2021-08-03 | Payer: PRIVATE HEALTH INSURANCE

## 2021-08-06 ENCOUNTER — Ambulatory Visit
Admit: 2021-08-06 | Discharge: 2021-09-04 | Payer: PRIVATE HEALTH INSURANCE | Attending: Radiation Oncology | Primary: Radiation Oncology

## 2021-08-06 ENCOUNTER — Ambulatory Visit
Admit: 2021-08-06 | Discharge: 2021-08-24 | Payer: PRIVATE HEALTH INSURANCE | Attending: Radiation Oncology | Primary: Radiation Oncology

## 2021-08-06 ENCOUNTER — Ambulatory Visit
Admit: 2021-08-06 | Discharge: 2021-09-03 | Payer: PRIVATE HEALTH INSURANCE | Attending: Radiation Oncology | Primary: Radiation Oncology

## 2021-08-15 ENCOUNTER — Ambulatory Visit
Admit: 2021-08-15 | Discharge: 2021-08-16 | Payer: PRIVATE HEALTH INSURANCE | Attending: Radiation Oncology | Primary: Radiation Oncology

## 2021-08-15 ENCOUNTER — Ambulatory Visit: Admit: 2021-08-15 | Discharge: 2021-08-15 | Payer: PRIVATE HEALTH INSURANCE

## 2021-08-16 ENCOUNTER — Ambulatory Visit: Admit: 2021-08-16 | Discharge: 2021-08-17 | Payer: PRIVATE HEALTH INSURANCE

## 2021-08-16 ENCOUNTER — Other Ambulatory Visit: Admit: 2021-08-16 | Discharge: 2021-08-17 | Payer: PRIVATE HEALTH INSURANCE

## 2021-08-16 DIAGNOSIS — C8589 Other specified types of non-Hodgkin lymphoma, extranodal and solid organ sites: Principal | ICD-10-CM

## 2021-08-16 MED ORDER — DEXAMETHASONE 4 MG TABLET
ORAL_TABLET | Freq: Two times a day (BID) | ORAL | 1 refills | 30 days | Status: CP
Start: 2021-08-16 — End: 2021-09-15

## 2021-08-19 MED ORDER — ISAVUCONAZONIUM SULFATE 186 MG CAPSULE
ORAL_CAPSULE | Freq: Every day | ORAL | 3 refills | 35 days | Status: CP
Start: 2021-08-19 — End: ?

## 2021-08-21 ENCOUNTER — Ambulatory Visit: Admit: 2021-08-21 | Discharge: 2021-08-22 | Payer: PRIVATE HEALTH INSURANCE

## 2021-08-21 DIAGNOSIS — C8589 Other specified types of non-Hodgkin lymphoma, extranodal and solid organ sites: Principal | ICD-10-CM

## 2021-08-21 DIAGNOSIS — Z79899 Other long term (current) drug therapy: Principal | ICD-10-CM

## 2021-08-21 MED ORDER — ISAVUCONAZONIUM SULFATE 186 MG CAPSULE
ORAL_CAPSULE | Freq: Every day | ORAL | 0 refills | 0.00000 days | Status: CP
Start: 2021-08-21 — End: ?

## 2021-08-22 DIAGNOSIS — Z79899 Other long term (current) drug therapy: Principal | ICD-10-CM

## 2021-08-22 DIAGNOSIS — C8589 Other specified types of non-Hodgkin lymphoma, extranodal and solid organ sites: Principal | ICD-10-CM

## 2021-09-02 DIAGNOSIS — C8589 Other specified types of non-Hodgkin lymphoma, extranodal and solid organ sites: Principal | ICD-10-CM

## 2021-09-02 MED ORDER — IBRUTINIB 560 MG TABLET
ORAL_TABLET | Freq: Every day | ORAL | 11 refills | 28 days | Status: CP
Start: 2021-09-02 — End: 2022-08-28
  Filled 2021-09-06: qty 28, 28d supply, fill #0

## 2021-09-04 ENCOUNTER — Emergency Department
Admit: 2021-09-04 | Discharge: 2021-09-05 | Disposition: A | Discharge status: Home / Self Care | Payer: PRIVATE HEALTH INSURANCE | Attending: Student in an Organized Health Care Education/Training Program

## 2021-09-04 ENCOUNTER — Ambulatory Visit
Admit: 2021-09-04 | Discharge: 2021-09-05 | Disposition: A | Discharge status: Home / Self Care | Payer: PRIVATE HEALTH INSURANCE | Attending: Student in an Organized Health Care Education/Training Program

## 2021-09-04 ENCOUNTER — Ambulatory Visit: Admit: 2021-09-04 | Payer: PRIVATE HEALTH INSURANCE

## 2021-09-04 LAB — CBC W/ AUTO DIFF
BASOPHILS ABSOLUTE COUNT: 0 10*9/L (ref 0.0–0.1)
BASOPHILS RELATIVE PERCENT: 0.2 %
EOSINOPHILS ABSOLUTE COUNT: 0 10*9/L (ref 0.0–0.5)
EOSINOPHILS RELATIVE PERCENT: 0 %
HEMATOCRIT: 47.3 % (ref 39.0–48.0)
HEMOGLOBIN: 16.7 g/dL — ABNORMAL HIGH (ref 12.9–16.5)
LYMPHOCYTES ABSOLUTE COUNT: 1.4 10*9/L (ref 1.1–3.6)
LYMPHOCYTES RELATIVE PERCENT: 16.9 %
MEAN CORPUSCULAR HEMOGLOBIN CONC: 35.2 g/dL (ref 32.0–36.0)
MEAN CORPUSCULAR HEMOGLOBIN: 34 pg — ABNORMAL HIGH (ref 25.9–32.4)
MEAN CORPUSCULAR VOLUME: 96.5 fL — ABNORMAL HIGH (ref 77.6–95.7)
MEAN PLATELET VOLUME: 6.7 fL — ABNORMAL LOW (ref 6.8–10.7)
MONOCYTES ABSOLUTE COUNT: 0.4 10*9/L (ref 0.3–0.8)
MONOCYTES RELATIVE PERCENT: 4.7 %
NEUTROPHILS ABSOLUTE COUNT: 6.7 10*9/L (ref 1.8–7.8)
NEUTROPHILS RELATIVE PERCENT: 78.2 %
PLATELET COUNT: 194 10*9/L (ref 150–450)
RED BLOOD CELL COUNT: 4.9 10*12/L (ref 4.26–5.60)
RED CELL DISTRIBUTION WIDTH: 14.4 % (ref 12.2–15.2)
WBC ADJUSTED: 8.6 10*9/L (ref 3.6–11.2)

## 2021-09-04 LAB — URINALYSIS WITH CULTURE REFLEX
BACTERIA: NONE SEEN /HPF
BILIRUBIN UA: NEGATIVE
BLOOD UA: NEGATIVE
GLUCOSE UA: NEGATIVE
LEUKOCYTE ESTERASE UA: NEGATIVE
NITRITE UA: NEGATIVE
PH UA: 5.5 (ref 5.0–9.0)
PROTEIN UA: NEGATIVE
RBC UA: 1 /HPF (ref <=3)
SPECIFIC GRAVITY UA: 1.016 (ref 1.003–1.030)
SQUAMOUS EPITHELIAL: <1 /HPF (ref 0–5)
UROBILINOGEN UA: <2
WBC UA: <1 /HPF (ref <=2)

## 2021-09-04 LAB — COMPREHENSIVE METABOLIC PANEL
ALBUMIN: 4.2 g/dL (ref 3.4–5.0)
ALKALINE PHOSPHATASE: 55 U/L (ref 46–116)
ALT (SGPT): 51 U/L — ABNORMAL HIGH (ref 10–49)
ANION GAP: 12 mmol/L (ref 5–14)
AST (SGOT): 21 U/L (ref <=34)
BILIRUBIN TOTAL: 0.6 mg/dL (ref 0.3–1.2)
BLOOD UREA NITROGEN: 18 mg/dL (ref 9–23)
BUN / CREAT RATIO: 23
CALCIUM: 9.3 mg/dL (ref 8.7–10.4)
CHLORIDE: 103 mmol/L (ref 98–107)
CO2: 24 mmol/L (ref 20.0–31.0)
CREATININE: 0.79 mg/dL
EGFR CKD-EPI (2021) MALE: >90 mL/min/{1.73_m2} (ref >=60)
GLUCOSE RANDOM: 139 mg/dL (ref 70–179)
POTASSIUM: 3.7 mmol/L (ref 3.4–4.8)
PROTEIN TOTAL: 7.6 g/dL (ref 5.7–8.2)
SODIUM: 139 mmol/L (ref 135–145)

## 2021-09-04 LAB — HIGH SENSITIVITY TROPONIN I - SERIAL: HIGH SENSITIVITY TROPONIN I: 3 ng/L (ref <=53)

## 2021-09-04 LAB — MAGNESIUM: MAGNESIUM: 2.1 mg/dL (ref 1.6–2.6)

## 2021-09-04 LAB — HIGH SENSITIVITY TROPONIN I - 2 HOUR SERIAL
HIGH SENSITIVITY TROPONIN - DELTA (0-2H): 0 ng/L (ref <=7)
HIGH-SENSITIVITY TROPONIN I - 2 HOUR: <3 ng/L (ref <=53)

## 2021-09-04 NOTE — Unmapped (Signed)
[facility] Triage Note     Patient: Andre Duncan     Reason for call:  return call    Time call returned: 1440     Phone Assessment: [name], wife calling to say that the pt apparently loss consciousness while in the kitchen and fell.  He has no recall of being dizzy, losing consciousness or falling.  Per [name], he asked her why he was sleeping on the floor.      Goal for this communication:  Pt is receiving stereotactic brain radiation and after fall, they are on their way to the ED but wanted Dr Elmon Kirschner to be aware     Triage Recommendations: RN agrees with trip to Ed and will notify team of accident

## 2021-09-04 NOTE — Unmapped (Addendum)
Pt here d/t unwitnessed syncopal episode. Unknown if he hit head as he woke up on his back. No blood thinners. Pt's wife reports pt is confuse since the fall.

## 2021-09-04 NOTE — Unmapped (Addendum)
[facility]  Emergency Department Medical Screening Examination     Subjective     Andre Duncan is a 55 y.o. male with a PMH of CNS lymphoma presenting for evaluation of Syncope. The patient's wife reports that the patient appears to have had a syncopal episode earlier today, unwitnessed, though she did hear him fall with a crash. Unknown LOC or head strike, though the patient himself denies either. He further denies preceding dizziness, only noting that he did not know why he was sleeping on the floor. The patient's wife notes that he had been somewhat confused prior to this episode, repeatedly asking about needing to complete business. His confusion is reportedly worse since the episode.  Pt currently has no complaints and denies pain.     Abbreviated Review of Systems/Covid Screen  Constitutional: Negative for fever  Respiratory: Negative for cough.  Negative for difficulty breathing.    Objective     ED Triage Vitals   Enc Vitals Group      BP 09/04/21 1543 117/78      Heart Rate 09/04/21 1523 91      SpO2 Pulse --       Resp 09/04/21 1543 20      Temp 09/04/21 1543 36.6 ??C (97.9 ??F)      Temp Source 09/04/21 1543 Oral      SpO2 09/04/21 1523 98 %     Focused Physical Exam  Constitutional: No acute distress.  Respiratory: Non-labored respirations.  Neurological: Clear speech. No gross focal neurologic deficits are appreciated.  ?  Assessment & Plan     Patient with known CNS lymphoma presenting with syncope and per wife altered mental status.  Will likely need MRI brain, but for now will get screening CT to evaluate for trauma or hemorrhage.    Appropriate ED triage orders placed. Will defer further work-up and assessment to the main ED team.      A medical screening exam has been performed. At the time of this evaluation, no emergency medical condition requiring immediate stabilization has been identified nor is there suspicion for imminent decompensation. Appropriate triage protocols will be implemented and a comprehensive ED evaluation with disposition will be completed by a healthcare provider when an appropriate ED location becomes available. The patient is aware that this is an initial encounter only and verbalizes understanding and agreement with the plan.     Emergency Department operations continue to be impacted by the COVID-19 pandemic.     Andre Duncan  September 04, 2021 3:35 PM    Documentation assistance was provided by Andre Duncan, Scribe, on September 04, 2021 at 3:36 PM for Andre Drape, MD, MHS.      -------------------------------------------------------------------------------------  Documentation assistance was provided by the scribe in my presence.  The documentation recorded by the scribe has been reviewed by me and accurately reflects the services I personally performed.    Andre Duncan was evaluated in Emergency Department at the time of this visit for the symptoms described in the history of present illness. he was evaluated in the context of the global COVID-19 pandemic, which necessitated consideration that the patient might be at risk for infection with the SARS-CoV-2 virus that causes COVID-19. Institutional protocols and algorithms that pertain to the evaluation of patients at risk for COVID-19 were followed during the patient's care in the ED.    Portions of this record have been created using Scientist, clinical (histocompatibility and immunogenetics). Dictation errors have been sought, but may not  have been identified and corrected.

## 2021-09-04 NOTE — Unmapped (Signed)
[facility] Specialty Medication Onboarding    Specialty Medication: Imbruvica 560mg   Prior Authorization: Approved   Financial Assistance: No - copay  <$25  Final Copay/Day Supply: $0 / 28    Insurance Restrictions: None     Notes to Pharmacist:     The triage team has completed the benefits investigation and has determined that the patient is able to fill this medication at [facility]. Please contact the patient to complete the onboarding or follow up with the prescribing physician as needed.

## 2021-09-04 NOTE — Unmapped (Signed)
Hi,     Elige Radon has contacted the Communication Center in regards to the following symptom:     Fall or Passing out    Please contact Elige Radon at [phone]      A page or telephone call has been made to the corresponding clinic.     Thank you,  Yolanda Bonine   [facility]   [phone]

## 2021-09-04 NOTE — Unmapped (Signed)
Pt arrives from home w/ wife w/ C/C of unwitnessed Syncopal episode, and confusion post syncopal episode per wife. 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

## 2021-09-05 ENCOUNTER — Ambulatory Visit
Admit: 2021-09-05 | Discharge: 2021-09-06 | Payer: PRIVATE HEALTH INSURANCE | Attending: Radiation Oncology | Primary: Radiation Oncology

## 2021-09-05 ENCOUNTER — Ambulatory Visit: Admit: 2021-09-05 | Payer: PRIVATE HEALTH INSURANCE | Attending: Radiation Oncology | Primary: Radiation Oncology

## 2021-09-05 ENCOUNTER — Ambulatory Visit: Admit: 2021-09-05 | Discharge: 2021-09-06 | Payer: PRIVATE HEALTH INSURANCE

## 2021-09-05 ENCOUNTER — Ambulatory Visit
Admit: 2021-09-05 | Discharge: 2021-09-07 | Payer: PRIVATE HEALTH INSURANCE | Attending: Radiation Oncology | Primary: Radiation Oncology

## 2021-09-05 MED ORDER — LEVETIRACETAM 500 MG TABLET
ORAL_TABLET | Freq: Two times a day (BID) | ORAL | 0 refills | 30 days | Status: CP
Start: 2021-09-05 — End: 2021-10-05

## 2021-09-05 MED ADMIN — levETIRAcetam (KEPPRA) tablet 500 mg: 500 mg | ORAL | @ 06:00:00 | Stop: 2021-09-05

## 2021-09-05 MED ADMIN — gadobenate dimeglumine (MULTIHANCE) 529 mg/mL (0.1mmol/0.2mL) solution 16 mL: 16 mL | INTRAVENOUS | @ 02:00:00 | Stop: 2021-09-04

## 2021-09-05 NOTE — Unmapped (Signed)
09/05/2021    Dose Site Summary:Rx:Recurrent: 09/05/2021: 2,000/2,500 cGy    Subjective/Assessment/Recommendations: weekly status check    1. Ataxia/balance: Mild  2. Confusion: No issues  3. Insomnia: No issues  4. Nausea/Vomiting: No issues  5. Nutrition: No issues  6. Fatigue: Moderate  7. Pain: Denies pain  8. Elimination: No issues  9. Prescription Needs: None  10. Psychosocial: Has home support  11. Likely pt had seizure activity yesterday as he fell out of bed and seemed cognitively impaired for 30 minutes afterwards, per wife. Pt seen in ED - started on Keppra 500mg  twice daily. Pt denies any residual weakness, numbness/tingling. Pts balance does appear to be slightly off. Pt states he feels much better after yesterday.

## 2021-09-05 NOTE — Unmapped (Signed)
Radiation Oncology SBRT (Linac/Cyberknife) Daily Procedure Note    Date of Procedure: 09/05/2021    Patient Name: Andre Duncan          MR#: [id number]     Diagnosis:   1. Primary CNS lymphoma (CMS-HCC)         Andre Duncan is here today for his third fraction of SBRT.  We are planning a total of 5 fractions using CyberKnife.      All appropriate QA measures were taken to assure treatment accuracy:  Tracking verified, Treatment parameters were verified to be appropriate and Set up and all is OK to proceeed with RT as planned  Appropriate image guidance is performed.    Treatment Site: Brain    Tumor Volume Fx dose: 500 cGy    Total dose thus far received: 1500 cGy/2500 cGy    The treatment was delivered without incident.     Patient will return for Follow-Up: for their next fraction in a day or a few days       Cristino Martes, MD

## 2021-09-05 NOTE — Unmapped (Signed)
Initial Consult Note        Requesting Attending Physician:  No att. providers found  Service Requesting Consult: Emergency Medicine     Assessment and Plan          Andre Duncan is a 55 y.o. right handed male on whom I have been asked by No att. providers found to consult for possible seizure.    Possible seizure: Andre Duncan presented today after an unwitnessed event involving a fall of which he has no memory followed by confusion lasting 30 minutes. 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 His MRI today demonstrates no new lesions or enhancement, but persistent vasogenic edema most notable in right frontoparietal regions. His exam is baseline and non-focal. His CNS disease certainly increases his risk for seizures and the event has some features concerning for seizure (post-ictal period, memory lapse). For this reason, will start Keppra and plan to follow up in clinic for further management and workup. Defer EEG as patient is at his baseline.     Recommendations:  - Start Keppra 500 mg BID, first dose in ED  - Requested clinic follow up in 1-2 months    This patient was discussed with Dr. Lanora Manis, who agrees to the assessment and plan.     Enid Derry, MD  Resident Physician - PGY4  Department of Neurology         HPI        Reason for Consult: syncope versus seizure    Andre Duncan is a 55 y.o. right handed male on whom I have been asked by No att. providers found to consult for possible seizure.    Patient presents to the ED after an unwitnessed fall followed by 30 minutes of confusion. Patient was last seen by his wife about 45 minutes prior to the event. She heard a loud crash and went to check on him. She arrived within one minute of the crash, and found him standing asking repeatedly why he was sleeping on the floor. He reports that he had been in the room checking on the refrigerator and does not remember falling. He woke on the ground and was confused as to why he had been laying/sleeping on the floor. He returned to his baseline mental status within 30 minutes of the event.     Patient has diagnosis of CNS lymphoma which recently relapsed. He started palliative stereotactic radiation on Monday. He has been taking decadron BID since the relapse was discovered on 11/10. The radiologists are monitoring him for adverse effects of radiation such as ataxia and seizures. Wife called the rad-onc after this event and they did not feel it was related to the radiation. She has not noticed any other changes to his mental status or coordination. Patient denies any changes to his ambulation and strength. He has had brief (1-2 seconds) episodes of sharp pain on his right temple which has been going on for some time and was what prompted the repeat MRI which identified the relapse. No other headaches.     He has never had a seizure before. He has never been on seizure medications. He ambulates independently.     Allergies   Allergen Reactions   ??? Bactrim [Sulfamethoxazole-Trimethoprim]    ??? Sulfa (Sulfonamide Antibiotics) Rash     Developed during hospitalization d/c    ??? Tegaderm Adhesive-No Drug-Allergy Check Other (See Comments)          No current facility-administered  medications for this encounter.     Current Outpatient Medications   Medication Sig Dispense Refill   ??? albuterol HFA 90 mcg/actuation inhaler Inhale 2 puffs every six (6) hours as needed for wheezing.      ??? atorvastatin (LIPITOR) 20 MG tablet Take 1 tablet (20 mg total) by mouth daily. 90 tablet 3   ??? cetirizine (ZYRTEC) 10 MG tablet Take 10 mg by mouth daily.     ??? chlorhexidine (PERIDEX) 0.12 % solution Rinse with 15 mL of the solution for one minute and spit. Use 1-2 times per day.  Do not eat or drink for 30 min after use. 473 mL prn   ??? cholecalciferol, vitamin D3-50 mcg, 2,000 unit,, 50 mcg (2,000 unit) tablet Take 2,000 Units by mouth daily.     ??? [START ON 09/07/2021] dexAMETHasone (DECADRON) 2 MG tablet Take 1 tablet (2 mg total) by mouth daily for 7 days. 4mg  BID for 7 days 4mg  in AM, 2mg  in PM for 7 days 2mg  BID  for 7 days 2mg  QAM for 7 days 70 tablet 0   ??? dexAMETHasone (DECADRON) 4 MG tablet Take 1 tablet (4 mg total) by mouth Two (2) times a day. 60 tablet 1   ??? escitalopram oxalate (LEXAPRO) 20 MG tablet Take 1 tablet (20 mg total) by mouth daily. 90 tablet 2   ??? finasteride (PROSCAR) 5 mg tablet Take 5 mg by mouth daily.     ??? fish oil/borage/flax/om3,6,9 1 (OMEGA 3-6-9 COMPLEX ORAL) Take 1 capsule by mouth daily.     ??? fluoride, sodium, (SF 5000 PLUS) 1.1 % Crea USE  SMALL AMOUNT TWICE DAILY 51 g 1   ??? ibrutinib (IMBRUVICA) 560 mg tablet Take 1 tablet (560 mg total) by mouth daily. 28 tablet 11   ??? isavuconazonium sulfate (CRESEMBA) 186 mg cap capsule Take 2 capsules (372 mg total) by mouth daily. 60 capsule 3   ??? isavuconazonium sulfate (CRESEMBA) 186 mg cap capsule Take 2 capsules (372mg ) by mouth every 8 hours for 6 doses, THEN take 2 capsules (372mg ) daily 14 capsule 0   ??? levETIRAcetam (KEPPRA) 500 MG tablet Take 1 tablet (500 mg total) by mouth Two (2) times a day. 60 tablet 0   ??? memantine (NAMENDA) 10 MG tablet Take 1 tablet (10 mg total) by mouth Two (2) times a day. (Patient not taking: Reported on 08/16/2021) 400 tablet 0   ??? mirtazapine (REMERON) 30 MG tablet Take 1 tablet (30 mg total) by mouth nightly. 90 tablet 1   ??? multivitamin (TAB-A-VITE/THERAGRAN) per tablet Take 1 tablet by mouth daily.     ??? OLANZapine (ZYPREXA) 5 MG tablet Take 1 tablet (5 mg total) by mouth nightly. 90 tablet 1    (Not in a hospital admission)      Past Medical History:   Diagnosis Date   ??? Anxiety    ??? COPD (chronic obstructive pulmonary disease) (CMS-HCC)    ??? Cytarabine poisoning    ??? Depression    ??? Diabetes mellitus (CMS-HCC)    ??? Pneumonia, aspiration (CMS-HCC)    ??? Prostate atrophy        No past surgical history on file.    Social History     Socioeconomic History   ??? Marital status: Married     Spouse name: None   ??? Number of children: None   ??? Years of education: None   ??? Highest education level: None   Tobacco Use   ??? Smoking status:  Every Day     Packs/day: 1.00     Years: 30.00     Pack years: 30.00     Types: Cigarettes   ??? Smokeless tobacco: Never   Vaping Use   ??? Vaping Use: Never used   Substance and Sexual Activity   ??? Alcohol use: Yes     Alcohol/week: 4.0 standard drinks     Types: 4 Cans of beer per week   ??? Drug use: Never   ??? Sexual activity: Yes     Partners: Female     Social Determinants of Health     Financial Resource Strain: Medium Risk   ??? Difficulty of Paying Living Expenses: Somewhat hard   Food Insecurity: No Food Insecurity   ??? Worried About Running Out of Food in the Last Year: Never true   ??? Ran Out of Food in the Last Year: Never true   Transportation Needs: No Transportation Needs   ??? Lack of Transportation (Medical): No   ??? Lack of Transportation (Non-Medical): No         Family History   Problem Relation Age of Onset   ??? Cancer Mother    ??? Diabetes Mother    ??? Cancer Father        Code Status: Full Code     Review of Systems     A 12-system review of systems was conducted and was negative except as documented above in the HPI.       Objective        Heart Rate:  [69-73] 69  BP: (121-135)/(79-88) 121/84  No intake/output data recorded.    Physical Exam:  @[id number] @        Diagnostic Studies      All Labs Last 24hrs:   Recent Results (from the past 24 hour(s))   hsTroponin I - 2 Hour    Collection Time: 09/04/21  8:21 PM   Result Value Ref Range    hsTroponin I <3 <=53 ng/L    delta hsTroponin I 0 <=7 ng/L   ECG 12 Lead    Collection Time: 09/04/21  8:28 PM   Result Value Ref Range    EKG Systolic BP  mmHg    EKG Diastolic BP  mmHg    EKG Ventricular Rate 67 BPM    EKG Atrial Rate 67 BPM    EKG P-R Interval 154 ms    EKG QRS Duration 86 ms    EKG Q-T Interval 404 ms    EKG QTC Calculation 426 ms    EKG Calculated P Axis  degrees    EKG Calculated R Axis 71 degrees EKG Calculated T Axis -90 degrees    QTC Fredericia 419 ms       MRI brain w wo, 09/04/21, personally reviewed:  Interval decrease in size of enhancing lesions in the right centrum semiovale and right parietal lobe. Additionally, there has been a slight interval decrease in surrounding vasogenic edema. No new enhancing lesions.

## 2021-09-05 NOTE — Unmapped (Signed)
Radiation Treatment Management Note    ENCOUNTER DATE: 09/05/2021  PATIENT NAME: Andre Duncan  MRN: [id number]    Diagnosis:  Primary CNS Lymphoma, relapsed    Treatment Plan: CK to recurrent brain lesion    CURRENT TREATMENT:   20Gy of 25Gy planned      ASSESSMENT AND PLAN:   55 y.o. man with CNS lymphoma s/p matrix regimen, high dose methotrexate, consolidative radiation 180 cGy x 13 fx = 2340 cGy completed on 02/04/2021, now with recurrent CNS disease, receiving CK.     -PCNSL: continue radiation treatment as planned. Will likely pursue trial therapy post RT.   -Symptoms management: He presented to the ED yesterday with seizure, MRI showed good response to radiation thus far, started on Keppra per Neurology. Dexamethasone taper schedule provided today (slow taper).  -Follow-up: 2 months with same day MRI brain. Will coordinate with Dr. Elmon Kirschner and can change based on protocol requirements.     -------------------------------------------    Subjective:   Reports feeling well today, no further seizures. No headaches.     PHYSICAL EXAMINATION:  There were no vitals taken for this visit.  Karnofsky/Lansky Performance Status:  80, Normal activity with effort; some signs or symptoms of disease (ECOG equivalent 1)  GENERAL:  Reveals a well-developed, well-nourished, person in no apparent distress       Winferd Humphrey, MD, PhD  Radiation Oncology PGY-5    -I saw and evaluated/examined the patient, participating in the key portions of the service.  Dana L. Baird Lyons, MD  Assistant Professor  Department of Radiation Oncology

## 2021-09-05 NOTE — Unmapped (Signed)
Dexamethasone taper:  Take 4mg  dexamethasone twice a day from 09/07/21-09/13/21  Take 4mg  dexamethasone in the morning, and 2mg  in the evening from 09/14/21-09/20/21  Take 2mg  dexamethasone in the morning, and 2mg  in the evening from 09/21/21-09/27/21  Take 2mg  dexamethasone in the morning, from 09/28/21-10/04/21  Discontinue dexamethasone starting 10/05/21

## 2021-09-05 NOTE — Unmapped (Signed)
Patient's partner called to let Dr Baird Lyons know that she brought patient into the ED after he apparently lost consciousness earlier. She heard him fall and by the time she got to the kitchen he was on his feet and telling her he didn't know why he'd been sleeping on the floor  having no recollection of the event. He was having some confusion prior to the event and she reports it being worse since. Told her that I'd update Dr Baird Lyons.

## 2021-09-05 NOTE — Unmapped (Unsigned)
[facility]   Patient Onboarding/Medication Counseling    Andre Duncan is a 55 y.o. 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  I am speaking to the patient's family member, [name] - significant other.    Was a Nurse, learning disability used for this call? No    Verified patient's date of birth / HIPAA.    Specialty medication(s) to be sent: Hematology/Oncology: Imbruvica    Non-specialty medications/supplies to be sent: none    Medications not needed at this time: none     Imbruvica (ibrutinib)    Medication & Administration     Dosage: Take one 560 mg tablet by mouth once daily    Administration:   ??? Administer orally with a glass of water at approximately the same time every day.  ??? Swallow capsules and tablets whole with a full glass of water. Do not open, break, or chew the capsules; do not cut, crush, or chew the tablets.   ??? Maintain adequate hydration during treatment.   ??? May be administered taken with or without food. Taking with food may increase the drug concentration and may possibly increase the risk of toxicities.     Adherence/Missed dose instructions: Administer as soon as the missed dose is remembered on the same day; return to normal scheduling the following day. Do not take extra doses to make up for the missed dose.    Goals of Therapy     ??? to bring about and prolong progression free remission, to minimize the number of lymph nodes and/or organs affected and to improve the patient's quality of life    Side Effects & Monitoring Parameters     ??? Fatigue, loss of strength and energy  ??? Nausea, vomiting  ??? Diarrhea  ??? Constipation  ??? Muscle spasms and pain  ??? Joint pain  ??? Restlessness, difficulty seeping  ??? Anxiety  ??? Common cold symptoms  ??? Headache, dizziness     The following side effects should be reported to the provider: Discussed TLS, Bleeding risk,  HTN monitoring.  [name] is a Engineer, civil (consulting) and she is aware to look over drug information sheet provided with delivery.   ??? Signs of infection (fever >100.4, chills, mouth sores, sputum production)  ??? Signs of tumor lysis syndrome (fast or abnormal heartbeat, muscle weakness, cramps, nausea, vomiting, diarrhea, lack of appetite, feeling sluggish)  ??? Signs of liver problems (dark urine, abdominal pain, light-colored stools, vomiting, yellow skin or eyes)  ??? Signs of bleeding (vomiting or coughing up blood, blood that looks like coffee grounds, blood in the urine or black, red tarry stools, bruising that gets bigger without reason, any persistent or severe bleeding)  ??? Signs of peripheral edema (swelling in the arms of legs, excessive weight gain, shortness of breath)  ??? Skin rash or signs of skin infection (cracking, peeling, blistering, bleeding skin)  ??? Signs of hypertension (severe headache, severe dizziness, chest pain, confusion, difficulty breathing, visual changes)  ??? Signs of anaphylaxis (wheezing, chest tightness, swelling of face, lips, tongue or throat)    Monitoring Parameters:   ??? Blood counts monthly or as clinically necessary   ??? Renal and hepatic function   ??? Uric acid levels as clinically necessary   ??? Verify pregnancy status prior to treatment initiation (in females of reproductive potential)  ??? Blood pressure   ??? Sign/symptoms of bleeding, infections, progressive multifocal encephalopathy, tumor lysis syndrome, second primary malignancies   ??? Signs/symptoms of cardiac  arrhythmias  ??? ECG prior to initiation (patients with cardiac risk factors or history of cardiac arrhythmias) and during therapy if clinically indicated  ??? Adherence    Contraindications, Warnings, & Precautions     ??? Cardiovascular effects: Serious (and some fatal) cardiac arrhythmias have occurred with therapy, including ? grade 3 atrial fibrillation, atrial flutter, and ventricular tachyarrhythmia.  ??? GI toxicity: Diarrhea has been commonly observed; maintain adequate hydration.  ??? Hematologic effects: Grade 3 and 4 neutropenia, thrombocytopenia, and anemia occurred during clinical studies in patients who received single-agent ibrutinib for B-cell malignancies.  ??? Hemorrhage: Major hemorrhage (? grade 3, serious, or any CNS events; eg, intracranial hemorrhage [including subdural hematoma], GI bleeding, hematuria, post procedural bleeding) has occurred.  ??? Infections: Serious infections (some fatal) have been observed, including bacterial, viral, and fungal infections.    Drug/Food Interactions     ??? Medication list reviewed in Epic. The patient was instructed to inform the care team before taking any new medications or supplements.  Ibrutinib may enhance the adverse/toxic effect of Agents with Antiplatelet Properties like escitalopram and Omega 3 fish oil - Monitor therapy.  Isavuconazole - CYP3A4 Inhibitors (Moderate) may increase the serum concentration of Ibrutinib. - Patient is not currently taking isavuconazole.   ??? Grapefruit, grapefruit juice, and Seville oranges may increase ibrutinib exposure.  Avoid drinking grapefruit juice and eating grapefruit or Seville oranges during therapy.    Storage, Handling Precautions, & Disposal     ??? Store at room temperature in the original container (do not use a pillbox or store with other medications).   ??? Caregivers helping administer medication should wear gloves and wash hands immediately after.    ??? Keep the lid tightly closed. Keep out of the reach of children and pets.  ??? Do not flush down a toilet or pour down a drain unless instructed to do so.  Check with your local police department or fire station about drug take-back programs in your area.     Current Medications (including OTC/herbals), Comorbidities and Allergies     Current Outpatient Medications   Medication Sig Dispense Refill   ??? albuterol HFA 90 mcg/actuation inhaler Inhale 2 puffs every six (6) hours as needed for wheezing.      ??? atorvastatin (LIPITOR) 20 MG tablet Take 1 tablet (20 mg total) by mouth daily. 90 tablet 3   ??? cetirizine (ZYRTEC) 10 MG tablet Take 10 mg by mouth daily.     ??? chlorhexidine (PERIDEX) 0.12 % solution Rinse with 15 mL of the solution for one minute and spit. Use 1-2 times per day.  Do not eat or drink for 30 min after use. 473 mL prn   ??? cholecalciferol, vitamin D3-50 mcg, 2,000 unit,, 50 mcg (2,000 unit) tablet Take 2,000 Units by mouth daily.     ??? [START ON 09/07/2021] dexAMETHasone (DECADRON) 2 MG tablet Take 1 tablet (2 mg total) by mouth daily for 7 days. 4mg  BID for 7 days 4mg  in AM, 2mg  in PM for 7 days 2mg  BID  for 7 days 2mg  QAM for 7 days 70 tablet 0   ??? dexAMETHasone (DECADRON) 4 MG tablet Take 1 tablet (4 mg total) by mouth Two (2) times a day. 60 tablet 1   ??? escitalopram oxalate (LEXAPRO) 20 MG tablet Take 1 tablet (20 mg total) by mouth daily. 90 tablet 2   ??? finasteride (PROSCAR) 5 mg tablet Take 5 mg by mouth daily.     ??? fish oil/borage/flax/om3,6,9 1 (  OMEGA 3-6-9 COMPLEX ORAL) Take 1 capsule by mouth daily.     ??? fluoride, sodium, (SF 5000 PLUS) 1.1 % Crea USE  SMALL AMOUNT TWICE DAILY 51 g 1   ??? ibrutinib (IMBRUVICA) 560 mg tablet Take 1 tablet (560 mg total) by mouth daily. 28 tablet 11   ??? isavuconazonium sulfate (CRESEMBA) 186 mg cap capsule Take 2 capsules (372 mg total) by mouth daily. 60 capsule 3   ??? isavuconazonium sulfate (CRESEMBA) 186 mg cap capsule Take 2 capsules (372mg ) by mouth every 8 hours for 6 doses, THEN take 2 capsules (372mg ) daily 14 capsule 0   ??? levETIRAcetam (KEPPRA) 500 MG tablet Take 1 tablet (500 mg total) by mouth Two (2) times a day. 60 tablet 0   ??? memantine (NAMENDA) 10 MG tablet Take 1 tablet (10 mg total) by mouth Two (2) times a day. (Patient not taking: Reported on 08/16/2021) 400 tablet 0   ??? mirtazapine (REMERON) 30 MG tablet Take 1 tablet (30 mg total) by mouth nightly. 90 tablet 1   ??? multivitamin (TAB-A-VITE/THERAGRAN) per tablet Take 1 tablet by mouth daily.     ??? OLANZapine (ZYPREXA) 5 MG tablet Take 1 tablet (5 mg total) by mouth nightly. 90 tablet 1     No current facility-administered medications for this visit.       Allergies   Allergen Reactions   ??? Bactrim [Sulfamethoxazole-Trimethoprim]    ??? Sulfa (Sulfonamide Antibiotics) Rash     Developed during hospitalization d/c    ??? Tegaderm Adhesive-No Drug-Allergy Check Other (See Comments)       Patient Active Problem List   Diagnosis   ??? Primary CNS lymphoma (CMS-HCC)   ??? Tobacco use   ??? Neutropenia (CMS-HCC)   ??? Dental disease   ??? COPD (chronic obstructive pulmonary disease) (CMS-HCC)   ??? Diffuse large B cell lymphoma (CMS-HCC)   ??? Anxiety and depression   ??? BPH (benign prostatic hyperplasia)   ??? Seasonal allergies   ??? Insomnia   ??? Poor short term memory   ??? Hyperlipidemia   ??? Elevated glucose       Reviewed and up to date in Epic.    Appropriateness of Therapy     Acute infections noted within Epic:  No active infections  Patient reported infection: Was not able to discuss    Is medication and dose appropriate based on diagnosis and infection status? Yes    Prescription has been clinically reviewed: Yes      Baseline Quality of Life Assessment      How many days over the past month did your Primary CNS Lymphoma  keep you from your normal activities? For example, brushing your teeth or getting up in the morning. Was not able to discuss    Financial Information     Medication Assistance provided: Prior Authorization    Anticipated copay of $0 / 28 days reviewed with patient. Verified delivery address.    Delivery Information     Scheduled delivery date: 09/06/21    Expected start date: 09/06/21    Medication will be delivered via Same Day Courier to the prescription address in [facility].  This shipment will not require a signature.      Explained the services we provide at [facility] and that each month we would call to set up refills.  Stressed importance of returning phone calls so that we could ensure they receive their medications in time each month.  Informed patient  that we should be setting up refills 7-10 days prior to when they will run out of medication.  A pharmacist will reach out to perform a clinical assessment periodically.  Informed patient that a welcome packet, containing information about our pharmacy and other support services, a Notice of Privacy Practices, and a drug information handout will be sent.      The patient or caregiver noted above participated in the development of this care plan and knows that they can request review of or adjustments to the care plan at any time.      Patient or caregiver verbalized understanding of the above information as well as how to contact the pharmacy at [phone] option 4 with any questions/concerns.  The pharmacy is open Monday through Friday 8:30am-4:30pm.  A pharmacist is available 24/7 via pager to answer any clinical questions they may have.    Patient Specific Needs     - Does the patient have any physical, cognitive, or cultural barriers? not at this time.  Was experiencing coginitive barriers due to diagnosis previously    - Does the patient have adequate living arrangements? (i.e. the ability to store and take their medication appropriately) Yes    - Did you identify any home environmental safety or security hazards? No    - Patient prefers to have medications discussed with  Patient and or partner [name]     - Is the patient or caregiver able to read and understand education materials at a high school level or above? Yes    - Patient's primary language is  Patient speaks Arabic as primary but can communicate in English     - Is the patient high risk? Yes, patient is taking oral chemotherapy. Appropriateness of therapy as been assessed    - Does the patient require physician intervention or other additional services (i.e. dietary/nutrition, smoking cessation, social work)? No      Kida Digiulio A Shari [facility] Specialty Pharmacist {Blank single:[id number]::No,Yes - ***}      Jenniah Bhavsar Wynell Balloon  [facility] Specialty Pharmacist

## 2021-09-05 NOTE — Unmapped (Addendum)
1:01 AM -  I received sign out from my colleague Dr. Fonnie Mu.    Illness Severity: stable    Patient Summary: Andre Duncan is a 55 y.o. 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   Pending MRI then neuro consult.    ED Course as of 09/05/21 0103   Wed Sep 04, 2021   2103 EKG: NSR, low voltage QRS, no signs of acute ischemia   2253 MRI: ??  IMPRESSION:  -Interval decrease in size of enhancing lesions in the right centrum semiovale and right parietal lobe. Additionally, there has been a slight interval decrease in surrounding vasogenic edema. No new enhancing lesions.   2316 Spoke to neuro who will see the pt.   Thu Sep 05, 2021   0100 Neurology has evaluated the patient and believe that this does sound concerning for seizure.  Given the MRI brain actually shows improving brain lesions, they feel comfortable sending patient home on Keppra twice a day.  I have discussed all findings with the patient and feel similarly.  Return precautions given, all questions answered.     Syncope High-Risk Features ( if this was syncope, but per neuro sounds more like seizure)  History: None  Past Medical History: None  Physical Exam: None  ECG: None #    Suspected etiology includes: Orthostatic hypotension / Dehydration, Electrolyte derangement and seizure. Less likely to be Cardiac Ischemia, PE, Anemia (w/ or w/o acute bleeding) and Infection based on no evidence of anemia/infection/cp/sob.    Based on my evaluation, this patient is low risk, and will be Discharged with primary care follow up      Vitals:    09/04/21 1814 09/04/21 2314 09/04/21 2316 09/04/21 2320   BP: 127/80 128/79 135/88 121/84   Pulse: 72 73 70 69   Resp: 18      Temp: 36.9 ??C (98.4 ??F)      TempSrc:       SpO2: 98%          MRI Brain W Wo Contrast   Final Result   -Interval decrease in size of enhancing lesions in the right centrum semiovale and right parietal lobe. Additionally, there has been a slight interval decrease in surrounding vasogenic edema. No new enhancing lesions.         CT head WO contrast   Final Result   No acute intracranial abnormalities.      XR Chest 2 views   Final Result      Clear lungs. Bibasilar atelectasis/scarring.          Lab Results   Component Value Date    WBC 8.6 09/04/2021    HGB 16.7 (H) 09/04/2021    HCT 47.3 09/04/2021    PLT 194 09/04/2021       Lab Results   Component Value Date    NA 139 09/04/2021    K 3.7 09/04/2021    CL 103 09/04/2021    CO2 24.0 09/04/2021    BUN 18 09/04/2021    CREATININE 0.79 09/04/2021    GLU 139 09/04/2021    CALCIUM 9.3 09/04/2021    MG 2.1 09/04/2021    PHOS 3.0 12/10/2020       Lab Results   Component Value Date    BILITOT 0.6 09/04/2021    PROT 7.6 09/04/2021    ALBUMIN 4.2  09/04/2021    ALT 51 (H) 09/04/2021    AST 21 09/04/2021    ALKPHOS 55 09/04/2021       Lab Results   Component Value Date    INR 1.04 12/10/2020    APTT 30.3 12/10/2020

## 2021-09-05 NOTE — Unmapped (Signed)
Emergency Department Provider Note      ED Assessment/Plan     Andre Duncan is a pleasant 55 y.o. 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      On presentation, vitals were within normal limits. Physical exam revealed Alert. Follows commands. Moving all extremities spontaneously. CN II-XII intact. Strength 5/5 in UE and LE bilaterally. Sensation intact in UE and LE bilaterally. Coordination intact in upper and lower extremities bilaterally.    Initial differential included syncope versus seizure.  Potential etiologies of both of these include progression of his metastasis, edema from recent radiation treatments, hemorrhagic metastasis, arrhythmia.    CBC WNL.   CMP WNL.  High-sensitivity troponin of 3.  Urinalysis unremarkable.  Chest x-ray unremarkable.    At time of signout at 9:00 PM, plan was as follows:   -- Follow-up CT head, MRI brain, second troponin, and EKG    History     Chief Complaint: Syncopal Episode     HPI: Andre Duncan is a pleasant 55 y.o. 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 The patient's wife reports that the patient had a possible, unwitnessed syncopal episode earlier today. She states she was in the other room, when she heard a loud crash, and subsequently found the patient on the floor. Unknown LOC or head strike, though the patient denies either at this time, but notes he does not know why he was sleeping on the floor. He is not anticoagulated. Currently, he notes recent weakness to his RUE and associated generalized weakness. Additionally, his wife notes he has been somewhat confused earlier today, and his confusion has worsened since the incident. He currently is compliant with his Decadron. Of note, the patient is currently undergoing SBRT using CyberKnife, with his last fraction being yesterday. Additionally, he has a recent history of whole brain radiation, which he finished in May. He has no history of seizures and has never been on seizure medications. He denies headache, chest pain, abdominal pain, fevers, or bowel/bladder incontinence.     ROS: A 10-pt ROS was performed and was negative except as noted in HPI     PMH/PSH:   Past Medical History:   Diagnosis Date   ??? Anxiety    ??? COPD (chronic obstructive pulmonary disease) (CMS-HCC)    ??? Cytarabine poisoning    ??? Depression    ??? Diabetes mellitus (CMS-HCC)    ??? Pneumonia, aspiration (CMS-HCC)    ??? Prostate atrophy         Allergies:   Allergies   Allergen Reactions   ??? Bactrim [Sulfamethoxazole-Trimethoprim]    ??? Sulfa (Sulfonamide Antibiotics) Rash     Developed during hospitalization d/c    ??? Tegaderm Adhesive-No Drug-Allergy Check Other (See Comments)       Social:   Social History     Tobacco Use   ??? Smoking status: Every Day     Packs/day: 1.00     Years: 30.00     Pack years: 30.00     Types: Cigarettes   ??? Smokeless tobacco: Never   Vaping Use   ??? Vaping Use: Never used   Substance Use Topics   ??? Alcohol use: Yes     Alcohol/week: 4.0 standard drinks     Types: 4 Cans of beer per  week   ??? Drug use: Never       Physical Exam     Vitals:   Vitals:    09/04/21 1523 09/04/21 1543 09/04/21 1814   BP:  117/78 127/80   Pulse: 91 90 72   Resp:  20 18   Temp:  36.6 ??C (97.9 ??F) 36.9 ??C (98.4 ??F)   TempSrc:  Oral    SpO2: 98% 97% 98%        Physical Exam:   General: Well appearing and in no acute distress  HEENT: Head atraumatic. Moist mucous membranes. No scleral icterus or conjunctivitis.    Cardiac: RRR. No MRG.    Pulmonary: Breathing comfortably w/ no accessory muscle usage. Clear to auscultation bilaterally.    Abdominal: Soft, nontender, nondistended abdomen.    GU: Deferred   MSK: No lower extremity edema.    Dermatologic: No jaundice or pallor.    Neurologic: Alert. Follows commands. Moving all extremities spontaneously. CN II-XII intact. Strength 5/5 in UE and LE bilaterally. Sensation intact in UE and LE bilaterally. Coordination intact in upper and lower extremities bilaterally.  Psychiatric: Normal mood and behavior      Documentation assistance was provided by Meryle Ready, Scribe, on September 04, 2021 at 7:33 PM for Glean Salen, MD.    Documentation assistance was provided by the scribe in my presence.  The documentation recorded by the scribe has been reviewed by me and accurately reflects the services I personally performed.       Glean Salen, MD   PGY-2, Emergency Medicine                 Sheppard Evens, MD  Resident  09/04/21 [phone]

## 2021-09-06 NOTE — Unmapped (Addendum)
Radiation Oncology SBRT (Linac/Cyberknife) Daily Procedure Note    Date of Procedure: 09/06/2021    Patient Name: Andre Duncan          MR#: [id number]     Diagnosis:   1. Primary CNS lymphoma (CMS-HCC)         [name] is here today for his fifth fraction of SBRT.  We are planning a total of 5 fractions using CyberKnife.      All appropriate QA measures were taken to assure treatment accuracy:  Tracking verified, Treatment parameters were verified to be appropriate and Set up and all is OK to proceeed with RT as planned  Appropriate image guidance is performed.    Treatment Site: Brain    Tumor Volume Fx dose: 500 cGy    Total dose thus far received: 2500 cGy/2500 cGy    The treatment was delivered without incident.     Patient will return for Follow-Up: in 8 weeks with MRI brain.        Cristino Martes, MD

## 2021-09-06 NOTE — Unmapped (Signed)
Follow-up Appointment Request  Provider: Rhunette Croft MD or resident clinic  Reason/Diagnosis: seizure  Priority level: Priority 2

## 2021-09-07 MED ORDER — DEXAMETHASONE 2 MG TABLET
ORAL_TABLET | Freq: Every day | ORAL | 0 refills | 70 days | Status: CP
Start: 2021-09-07 — End: 2021-09-14

## 2021-09-09 NOTE — Unmapped (Signed)
Radiation Oncology SBRT (Linac/Cyberknife) Daily Procedure Note    Date of Procedure: 09/04/2021    Patient Name: Andre Duncan          MR#: [id number]     Diagnosis:   1. Primary CNS lymphoma (CMS-HCC)         Mr. Dettman is here today for his third fraction of SBRT.  We are planning a total of 5 fractions using CyberKnife.      All appropriate QA measures were taken to assure treatment accuracy:  Tracking verified, Treatment parameters were verified to be appropriate and Set up and all is OK to proceeed with RT as planned  Appropriate image guidance is performed.    Treatment Site: Brain    Tumor Volume Fx dose: 500 cGy    Total dose thus far received: 1500 cGy/2500 cGy    The treatment was delivered without incident.     Patient will return for Follow-Up: for their next fraction in a day or a few days       Paulla Fore, MD  09/04/2021

## 2021-09-13 ENCOUNTER — Ambulatory Visit: Admit: 2021-09-13 | Discharge: 2021-09-13 | Payer: PRIVATE HEALTH INSURANCE

## 2021-09-13 ENCOUNTER — Other Ambulatory Visit: Admit: 2021-09-13 | Discharge: 2021-09-13 | Payer: PRIVATE HEALTH INSURANCE

## 2021-09-13 DIAGNOSIS — C8589 Other specified types of non-Hodgkin lymphoma, extranodal and solid organ sites: Principal | ICD-10-CM

## 2021-09-13 DIAGNOSIS — C8330 Diffuse large B-cell lymphoma, unspecified site: Principal | ICD-10-CM

## 2021-09-13 LAB — CBC W/ AUTO DIFF
BASOPHILS ABSOLUTE COUNT: 0 10*9/L (ref 0.0–0.1)
BASOPHILS RELATIVE PERCENT: 0.3 %
EOSINOPHILS ABSOLUTE COUNT: 0 10*9/L (ref 0.0–0.5)
EOSINOPHILS RELATIVE PERCENT: 0.1 %
HEMATOCRIT: 45 % (ref 39.0–48.0)
HEMOGLOBIN: 15.7 g/dL (ref 12.9–16.5)
LYMPHOCYTES ABSOLUTE COUNT: 1.2 10*9/L (ref 1.1–3.6)
LYMPHOCYTES RELATIVE PERCENT: 18 %
MEAN CORPUSCULAR HEMOGLOBIN CONC: 34.9 g/dL (ref 32.0–36.0)
MEAN CORPUSCULAR HEMOGLOBIN: 34.3 pg — ABNORMAL HIGH (ref 25.9–32.4)
MEAN CORPUSCULAR VOLUME: 98.3 fL — ABNORMAL HIGH (ref 77.6–95.7)
MEAN PLATELET VOLUME: 7.3 fL (ref 6.8–10.7)
MONOCYTES ABSOLUTE COUNT: 0.3 10*9/L (ref 0.3–0.8)
MONOCYTES RELATIVE PERCENT: 4.2 %
NEUTROPHILS ABSOLUTE COUNT: 5.2 10*9/L (ref 1.8–7.8)
NEUTROPHILS RELATIVE PERCENT: 77.4 %
PLATELET COUNT: 141 10*9/L — ABNORMAL LOW (ref 150–450)
RED BLOOD CELL COUNT: 4.58 10*12/L (ref 4.26–5.60)
RED CELL DISTRIBUTION WIDTH: 14.9 % (ref 12.2–15.2)
WBC ADJUSTED: 6.7 10*9/L (ref 3.6–11.2)

## 2021-09-13 LAB — COMPREHENSIVE METABOLIC PANEL
ALBUMIN: 3.7 g/dL (ref 3.4–5.0)
ALKALINE PHOSPHATASE: 45 U/L — ABNORMAL LOW (ref 46–116)
ALT (SGPT): 27 U/L (ref 10–49)
ANION GAP: 8 mmol/L (ref 5–14)
AST (SGOT): 18 U/L (ref <=34)
BILIRUBIN TOTAL: 0.6 mg/dL (ref 0.3–1.2)
BLOOD UREA NITROGEN: 12 mg/dL (ref 9–23)
BUN / CREAT RATIO: 17
CALCIUM: 8.8 mg/dL (ref 8.7–10.4)
CHLORIDE: 106 mmol/L (ref 98–107)
CO2: 24 mmol/L (ref 20.0–31.0)
CREATININE: 0.71 mg/dL
EGFR CKD-EPI (2021) MALE: >90 mL/min/{1.73_m2} (ref >=60)
GLUCOSE RANDOM: 259 mg/dL — ABNORMAL HIGH (ref 70–179)
POTASSIUM: 4.2 mmol/L (ref 3.4–4.8)
PROTEIN TOTAL: 6.5 g/dL (ref 5.7–8.2)
SODIUM: 138 mmol/L (ref 135–145)

## 2021-09-13 MED ORDER — LEVETIRACETAM 500 MG TABLET
ORAL_TABLET | Freq: Two times a day (BID) | ORAL | 6 refills | 30 days | Status: CP
Start: 2021-09-13 — End: 2021-10-13

## 2021-09-13 NOTE — Unmapped (Signed)
Have labs checked at [facility] next Wednesday  Keep taking the ibrutinib  Call if heart palpitations, bleeding  Call if having recurrent seizure like activity  Follow up with me on 12/30

## 2021-09-13 NOTE — Unmapped (Signed)
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  However, his PFTs were not good enough to get a transplant so he ultimately went to whole brain radiation therapy completed 02/04/21.      Unfortunately, routine imaging last week showed that his lymphoma has progressed and he was having some mild symptoms with headache and fatigue. Since starting the steroids these symptoms have resolved.  He returns today with his partner to discuss prognosis, treatment options etc.    I reviewed with them that the chance of cure is very low and anything we do is largely palliative to try to extend his life while maintaining his function and quality of life.  The prognosis relapsed CNS lymphoma is not good and is even worse in patients who relapse within the first few years of treatment.  Recent follow up of the MATRIX clinical trial reported that of the 106 patients who went into a CR after chemo, after a median follow up of 88 months, 51 patients (48%) had relapsed.  Of those 51, 17 went to best supportive care and all died w/in 4 months.  26 (51%) died, 21 of them died from lymphoma.  Eight (16%) of patients who were in a CR after treatment and subsequently relapsed were still alive which ws equal to 24% of those who relapsed and got other treatment were still alive.  Prognosis for patients who relapsed during the 2nd and 3rd years of follow up had inferiour survival compared ot onger remissions, with 2 year survival after relapse of only 12+/- 8%.    I told him that I didn't think that more cytotoxic chemotherapy would be that helpful since he already received an intensive regimen and progressed relatively quickly.  If I were to use chemo, I would likely use something like ICE since the ifos can cross the blood brain barrier, but we are definitely not considering chemotherapy right now.      I told him that he could discuss steriotactic radiation with Dr. Baird Lyons and he would like to pursue this route. I told him that new lesions could pop up locally  Even while he was getting this radiation and for this reason systemic therapy might be a better option.  For now, this is the approach he wants to take first but he is interested in getting systemic therapy afterwards.  Dr. Baird Lyons will likely continue his steroids 4mg  bid for now and through radiation.    I reviewed with him that the most likely systemic therapy I would recommend would be with a BTKi.  Off of a clinical trial I would recommend treatment with ibrutinib.   There is data for ibrutinib, a first generation BTKi (Soussain et al. Eur J Conacer 2019).  A phase II study of 44 evaluable patients with rel/ref primary CNS DLBCL demonstrated that after 2 months of treatment the disease control rate was 70% with a CR in  19%, PR in 33% and stable disease in 10%.  After a median follow up of 25.7 months, the median BFS was 48 months and left eye was 19.2 months.  13/44 patients received ibrutinib for over 12 months.  Ibrutinib was detectable in the CSF fluid.  Two patients developed pulmonary aspergillosis. NCCN recommendations include ibrutinib.     I reviewed th acalabrutinib clinical trial with him.  I told him that we don't have data for acalabrutinib in CNS lymphoma but it is FDA approved for use in other lymphomas and since the mechanism of action is similar to ibrutinib I expect that they would have similar efficacy. mouth Two (2) times a day. 60 tablet 1   ??? escitalopram oxalate (LEXAPRO) 20 MG tablet Take 1 tablet (20 mg total) by mouth daily. 90 tablet 2   ??? finasteride (PROSCAR) 5 mg tablet Take 5 mg by mouth daily.     ??? fish oil/borage/flax/om3,6,9 1 (OMEGA 3-6-9 COMPLEX ORAL) Take 1 capsule by mouth daily.     ??? fluoride, sodium, (SF 5000 PLUS) 1.1 % Crea USE  SMALL AMOUNT TWICE DAILY 51 g 1   ??? ibrutinib (IMBRUVICA) 560 mg tablet Take 1 tablet (560 mg total) by mouth daily. 28 tablet 11   ??? mirtazapine (REMERON) 30 MG tablet Take 1 tablet (30 mg total) by mouth nightly. 90 tablet 1   ??? multivitamin (TAB-A-VITE/THERAGRAN) per tablet Take 1 tablet by mouth daily.     ??? OLANZapine (ZYPREXA) 5 MG tablet Take 1 tablet (5 mg total) by mouth nightly. 90 tablet 1   ??? chlorhexidine (PERIDEX) 0.12 % solution Rinse with 15 mL of the solution for one minute and spit. Use 1-2 times per day.  Do not eat or drink for 30 min after use. (Patient not taking: Reported on 09/13/2021) 473 mL prn   ??? dexAMETHasone (DECADRON) 2 MG tablet Take 1 tablet (2 mg total) by mouth daily for 7 days. 4mg  BID for 7 days 4mg  in AM, 2mg  in PM for 7 days 2mg  BID  for 7 days 2mg  QAM for 7 days (Patient not taking: Reported on 09/13/2021) 70 tablet 0   ??? levETIRAcetam (KEPPRA) 500 MG tablet Take 1 tablet (500 mg total) by mouth Two (2) times a day. 60 tablet 6   ??? memantine (NAMENDA) 10 MG tablet Take 1 tablet (10 mg total) by mouth Two (2) times a day. (Patient not taking: Reported on 08/16/2021) 400 tablet 0     No current facility-administered medications for this visit.         REVIEW OF SYSTEMS:  See HPI. A 10 system ROS is otherwise negative.    VITAL SIGNS:   Vitals:    09/13/21 1355   BP: 128/91   Pulse: 74   Resp: 17   Temp: 37.2 ??C (99 ??F)   TempSrc: Temporal   SpO2: 99%   Weight: 85 kg (187 lb 4.8 oz)       EXAM:  Gen: NAD, Awake, Alert  LYMPH: No cervical, supraclavicular, axillary, or inguinal LAD  RESP: Clear to auscultation bilaterally.  CV: blurry so he can't read     Labs next Wed at [facility] w diff.  MRI planned for 2/1    He is thinking well. ??? Neutrophils % 09/13/2021 77.4  % Final   ??? Lymphocytes % 09/13/2021 18.0  % Final   ??? Monocytes % 09/13/2021 4.2  % Final   ??? Eosinophils % 09/13/2021 0.1  %  Final   ??? Basophils % 09/13/2021 0.3  % Final   ??? Absolute Neutrophils 09/13/2021 5.2  1.8 - 7.8 10*9/L Final   ??? Absolute Lymphocytes 09/13/2021 1.2  1.1 - 3.6 10*9/L Final   ??? Absolute Monocytes 09/13/2021 0.3  0.3 - 0.8 10*9/L Final   ??? Absolute Eosinophils 09/13/2021 0.0  0.0 - 0.5 10*9/L Final   ??? Absolute Basophils 09/13/2021 0.0  0.0 - 0.1 10*9/L Final

## 2021-09-17 NOTE — Unmapped (Signed)
Encounter addended by: Rogelio Seen, MD on: 09/16/2021 8:10 PM   Actions taken: Clinical Note Signed

## 2021-09-17 NOTE — Unmapped (Signed)
RADIATION ONCOLOGY TREATMENT COMPLETION NOTE    Encounter Date: 09/06/2021  Patient Name: Andre Duncan  Medical Record Number: [id number]    Referring Physician: Dr. Elmon Kirschner    DIAGNOSIS/ASSESSMENT:  55 year old man with primary CNS lymphoma s/p matrix regimen, high dose methotrexate, and consolidative radiation with 2340cGy in 02/2021, now s/p CyberKnife for relapsed disease     TREATMENT INTENT: local control    CLINICAL TRIAL: no    CHEMOTHERAPY: administered after radiation    [place] [facility]:  [facility]    RADIATION TREATMENT SUMMARY:          Treatment site Treatment Technique/Modality Energy Dose per fraction Total number  of fractions Total dose Start date End date   Brain SBRT 6 MV 500cGy 5 2500cGy 09/02/21 09/06/21     TOLERANCE TO TREATMENT:  Tolerated well although did likely have seizure after 3rd fraction, started on keppra    COMPLETED INTENDED COURSE:  yes    TREATMENT BREAK > 2 WEEKS:  no    PLAN FOR FOLLOW-UP:   Follow up with Dr. Elmon Kirschner regarding clinical trial enrollment/next steps  Follow up with [place] in 2 months with same day MRI brain. Will coordinate with Dr. Elmon Kirschner and can change based on protocol requirements.     Naythan Douthit L. Baird Lyons, MD  Assistant Professor  Department of Radiation Oncology

## 2021-09-17 NOTE — Unmapped (Signed)
Encounter addended by: Rogelio Seen, MD on: 09/16/2021 8:11 PM   Actions taken: Clinical Note Signed

## 2021-09-18 ENCOUNTER — Ambulatory Visit: Admit: 2021-09-18 | Discharge: 2021-09-19 | Payer: PRIVATE HEALTH INSURANCE

## 2021-09-18 LAB — COMPREHENSIVE METABOLIC PANEL
ALBUMIN: 4.2 g/dL (ref 3.4–5.0)
ALKALINE PHOSPHATASE: 49 U/L (ref 46–116)
ALT (SGPT): 26 U/L (ref 10–49)
ANION GAP: 4 mmol/L — ABNORMAL LOW (ref 5–14)
AST (SGOT): 18 U/L (ref <=34)
BILIRUBIN TOTAL: 1.1 mg/dL (ref 0.3–1.2)
BLOOD UREA NITROGEN: 17 mg/dL (ref 9–23)
BUN / CREAT RATIO: 23
CALCIUM: 9.1 mg/dL (ref 8.7–10.4)
CHLORIDE: 106 mmol/L (ref 98–107)
CO2: 24.6 mmol/L (ref 20.0–31.0)
CREATININE: 0.75 mg/dL
EGFR CKD-EPI (2021) MALE: >90 mL/min/{1.73_m2} (ref >=60)
GLUCOSE RANDOM: 210 mg/dL — ABNORMAL HIGH (ref 70–179)
POTASSIUM: 4.4 mmol/L (ref 3.4–4.8)
PROTEIN TOTAL: 7.1 g/dL (ref 5.7–8.2)
SODIUM: 135 mmol/L (ref 135–145)

## 2021-09-18 LAB — CBC W/ AUTO DIFF
BASOPHILS ABSOLUTE COUNT: 0 10*9/L (ref 0.0–0.1)
BASOPHILS RELATIVE PERCENT: 0.3 %
EOSINOPHILS ABSOLUTE COUNT: 0 10*9/L (ref 0.0–0.5)
EOSINOPHILS RELATIVE PERCENT: 0.1 %
HEMATOCRIT: 50.9 % — ABNORMAL HIGH (ref 39.0–48.0)
HEMOGLOBIN: 17.9 g/dL — ABNORMAL HIGH (ref 12.9–16.5)
LYMPHOCYTES ABSOLUTE COUNT: 1.3 10*9/L (ref 1.1–3.6)
LYMPHOCYTES RELATIVE PERCENT: 16.1 %
MEAN CORPUSCULAR HEMOGLOBIN CONC: 35.1 g/dL (ref 32.0–36.0)
MEAN CORPUSCULAR HEMOGLOBIN: 34.1 pg — ABNORMAL HIGH (ref 25.9–32.4)
MEAN CORPUSCULAR VOLUME: 97.1 fL — ABNORMAL HIGH (ref 77.6–95.7)
MEAN PLATELET VOLUME: 7.4 fL (ref 6.8–10.7)
MONOCYTES ABSOLUTE COUNT: 0.3 10*9/L (ref 0.3–0.8)
MONOCYTES RELATIVE PERCENT: 4.1 %
NEUTROPHILS ABSOLUTE COUNT: 6.4 10*9/L (ref 1.8–7.8)
NEUTROPHILS RELATIVE PERCENT: 79.4 %
NUCLEATED RED BLOOD CELLS: 0 /100{WBCs} (ref <=4)
PLATELET COUNT: 191 10*9/L (ref 150–450)
RED BLOOD CELL COUNT: 5.24 10*12/L (ref 4.26–5.60)
RED CELL DISTRIBUTION WIDTH: 15.2 % (ref 12.2–15.2)
WBC ADJUSTED: 8.1 10*9/L (ref 3.6–11.2)

## 2021-09-18 LAB — SLIDE REVIEW

## 2021-09-20 NOTE — Unmapped (Signed)
Hi,     [name] contacted the Communication Center requesting to speak with the care team of Andre Duncan to discuss:    [name] called requesting to speak with Dr Elmon Kirschner regarding patient fell on yesterday but ok.    Please contact [name] at [phone].    Check Indicates criteria has been reviewed and confirmed with the patient:    [x]  Preferred Name   [x]  DOB and/or MR#  [x]  Preferred Contact Method  [x]  Phone Number(s)   []  MyChart     Thank you,   Christell Faith  [facility]   [phone]

## 2021-09-20 NOTE — Unmapped (Signed)
Pt's family called triage line. She said that the patient had a fall yesterday, he is okay but his legs are weak. They reached out to med onc as well.

## 2021-09-20 NOTE — Unmapped (Addendum)
1119 Spoke with [name], did not hear back from Dr. Elmon Kirschner yet but advised that he needs to have CAT Scan and evaluate for these findings urgently and the only way to do this would be at an ED. She said they will be in [facility] today and will try to get him to go, he is adamant about not going to the ED unless he falls again. Reinforced the need for evaluation and treatment. She will talk with him. Will call back with response from team.    1036 Spoke with [name], [facility] for patient. She was calling because Dr. Elmon Kirschner wanted her to call if their was any change in symptoms with Greggory Stallion. She stated that he is on a decadron taper and this morning he got up at 6 AM and was in the kitchen and fell with no LOC. He denies seizure activity. She denies him hitting his head but he has been weak and off balance ever since. He is currently sleeping but he has been Alert and oriented and his LOC is good and he is still able to work on things. She said Dr. Baird Lyons said he could have some side effects of the radiation that will cause some weakness and balance issues but his his has went from a fast pace to a slow shuffling gait. She is wondering if they need to go up on the steroids or if he will need a CT Scan of his head to check for more edema. He has an eye appointment today for a thorough eye exam from his condition. They would really like to make that appointment if possible. If he needs to go to the ED she would like to wait until than.  This RN advised to go to the ED now but will wait for response from team to advise patient and EC if they can wait until after eye appointment.    [id number] Page out to Dr. Elmon Kirschner.

## 2021-09-23 NOTE — Unmapped (Signed)
Spoke to [employer] wife to further follow up. He is feeling better. On Friday morning, she believes he tripped getting coffee (leg gave out) rather than having had a seizure, although has had some periods of short lived confusion.     He is currently on dex 4mg  in AM and 2mg  (just switched on Sunday) without changes. I advised her to keep on this dose unless any worsening of symptoms or changes in mental status with this, in which case he should go back up to 4mg  BID.     She will keep [place] updated.    Shayn Madole L. Baird Lyons, MD  Assistant Professor  Department of Radiation Oncology

## 2021-09-23 NOTE — Unmapped (Signed)
1610 Spoke with [name]. Patient refused to go to the ED and he is currently taking the Dexamethasone ,per [name] ,Decadron 4 mg in the morning and 2 mg. She believes the fall was not a seizure but just tripped. She describes that he has periods of confusion but is able to go to work and stay busy. Advised that I will reach out to team to see if he should continue of steroid taper or change dose and frequency.

## 2021-09-24 NOTE — Unmapped (Signed)
[facility] Clinical Assessment & Refill Coordination Note    Andre Duncan, DOB: 02-25-1966  Phone: [phone] (home)     All above HIPAA information was verified with patient's family member, Andre Duncan, significant other.     Was a Nurse, learning disability used for this call? No    Specialty Medication(s):   Hematology/Oncology: Imbruvica     Current Outpatient Medications   Medication Sig Dispense Refill   ??? albuterol HFA 90 mcg/actuation inhaler Inhale 2 puffs every six (6) hours as needed for wheezing.      ??? atorvastatin (LIPITOR) 20 MG tablet Take 1 tablet (20 mg total) by mouth daily. 90 tablet 3   ??? cetirizine (ZYRTEC) 10 MG tablet Take 10 mg by mouth daily.     ??? chlorhexidine (PERIDEX) 0.12 % solution Rinse with 15 mL of the solution for one minute and spit. Use 1-2 times per day.  Do not eat or drink for 30 min after use. (Patient not taking: Reported on 09/13/2021) 473 mL prn   ??? cholecalciferol, vitamin D3-50 mcg, 2,000 unit,, 50 mcg (2,000 unit) tablet Take 2,000 Units by mouth daily.     ??? escitalopram oxalate (LEXAPRO) 20 MG tablet Take 1 tablet (20 mg total) by mouth daily. 90 tablet 2   ??? finasteride (PROSCAR) 5 mg tablet Take 5 mg by mouth daily.     ??? fish oil/borage/flax/om3,6,9 1 (OMEGA 3-6-9 COMPLEX ORAL) Take 1 capsule by mouth daily.     ??? fluoride, sodium, (SF 5000 PLUS) 1.1 % Crea USE  SMALL AMOUNT TWICE DAILY 51 g 1   ??? ibrutinib (IMBRUVICA) 560 mg tablet Take 1 tablet (560 mg total) by mouth daily. 28 tablet 11   ??? levETIRAcetam (KEPPRA) 500 MG tablet Take 1 tablet (500 mg total) by mouth Two (2) times a day. 60 tablet 6   ??? memantine (NAMENDA) 10 MG tablet Take 1 tablet (10 mg total) by mouth Two (2) times a day. (Patient not taking: Reported on 08/16/2021) 400 tablet 0   ??? mirtazapine (REMERON) 30 MG tablet Take 1 tablet (30 mg total) by mouth nightly. 90 tablet 1   ??? multivitamin (TAB-A-VITE/THERAGRAN) per tablet Take 1 tablet by mouth daily.     ??? OLANZapine (ZYPREXA) 5 MG tablet Take 1 tablet (5 mg total) by mouth nightly. 90 tablet 1     No current facility-administered medications for this visit.        Changes to medications: Andre Duncan reports no changes at this time.    Allergies   Allergen Reactions   ??? Bactrim [Sulfamethoxazole-Trimethoprim]    ??? Sulfa (Sulfonamide Antibiotics) Rash     Developed during hospitalization d/c    ??? Tegaderm Adhesive-No Drug-Allergy Check Other (See Comments)       Changes to allergies: No    SPECIALTY MEDICATION ADHERENCE     Imbruvica 560 mg: 12 days of medicine on hand     Medication Adherence    Patient reported X missed doses in the last month: 0  Specialty Medication: Imbruvica 560 mg once daily  Patient is on additional specialty medications: No  Informant: significant other  Confirmed plan for next specialty medication refill: delivery by pharmacy  Refills needed for supportive medications: not needed          Specialty medication(s) dose(s) confirmed: Regimen is correct and unchanged.     Are there any concerns with adherence? No    Adherence counseling provided? Not needed    CLINICAL  MANAGEMENT AND INTERVENTION      Clinical Benefit Assessment:    Do you feel the medicine is effective or helping your condition? Yes    Clinical Benefit counseling provided? Not needed    Adverse Effects Assessment:    Are you experiencing any side effects? Yes, patient reports experiencing coughing . Side effect counseling provided: managing cough.  Advised to decrease/quit smoking, increase fluid intake, contact provider if he develops fever or worsening of symptoms including difficulty breathing.    Are you experiencing difficulty administering your medicine? 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 For example, brushing your teeth or getting up in the morning. 0    Have you discussed this with your provider? Not needed    Acute Infection Status:    Acute infections noted within Epic:  No active infections  Patient reported infection: None    Therapy Appropriateness:    Is therapy appropriate and patient progressing towards therapeutic goals? Yes, therapy is appropriate and should be continued    DISEASE/MEDICATION-SPECIFIC INFORMATION      N/A    PATIENT SPECIFIC NEEDS     - Does the patient have any physical, cognitive, or cultural barriers? No    - Is the patient high risk? Yes, patient is taking oral chemotherapy. Appropriateness of therapy as been assessed    - Does the patient require a Care Management Plan? No     SOCIAL DETERMINANTS OF HEALTH     At the [facility], we have learned that life circumstances - like trouble affording food, housing, utilities, or transportation can affect the health of many of our patients.   That is why we wanted to ask: are you currently experiencing any life circumstances that are negatively impacting your health and/or quality of life? Patient declined to answer    Social Determinants of Health     Food Insecurity: No Food Insecurity   ??? Worried About Programme researcher, broadcasting/film/video in the Last Year: Never true   ??? Ran Out of Food in the Last Year: Never true   Tobacco Use: High Risk   ??? Smoking Tobacco Use: Every Day   ??? Smokeless Tobacco Use: Never   ??? Passive Exposure: Not on file   Transportation Needs: No Transportation Needs   ??? Lack of Transportation (Medical): No   ??? Lack of Transportation (Non-Medical): No   Alcohol Use: Not At Risk   ??? How often do you have a drink containing alcohol?: Never   ??? How many drinks containing alcohol do you have on a typical day when you are drinking?: 1 - 2   ??? How often do you have 5 or more drinks on one occasion?: Never   Housing/Utilities: Low Risk    ??? Within the past 12 months, have you ever stayed: outside, in a car, in a tent, in an overnight shelter, or temporarily in someone else's home (i.e. couch-surfing)?: No   ??? Are you worried about losing your housing?: No   ??? Within the past 12 months, have you been unable to get utilities (heat, electricity) when it was really needed?: No   Substance Use: Not on file   Financial Resource Strain: Medium Risk   ??? Difficulty  of Paying Living Expenses: Somewhat hard   Physical Activity: Not on file   Health Literacy: Not on file   Stress: Not on file   Intimate Partner Violence: Not on file   Depression: Not at risk   ??? PHQ-2 Score: 0   Social Connections: Not on file       Would you be willing to receive help with any of the needs that you have identified today? Not applicable       SHIPPING     Specialty Medication(s) to be Shipped:   Hematology/Oncology: Imbruvica    Other medication(s) to be shipped: No additional medications requested for fill at this time     Changes to insurance: No    Delivery Scheduled: Yes, Expected medication delivery date: 10/02/21.     Medication will be delivered via Next Day Courier to the confirmed prescription address in [facility].    The patient will receive a drug information handout for each medication shipped and additional FDA Medication Guides as required.  Verified that patient has previously received a Conservation officer, historic buildings and a Surveyor, mining.    The patient or caregiver noted above participated in the development of this care plan and knows that they can request review of or adjustments to the care plan at any time.      All of the patient's questions and concerns have been addressed.    Breck Coons [facility] Specialty Pharmacist

## 2021-09-26 NOTE — Unmapped (Signed)
Andre Duncan 's Imbruvica shipment will be sent out  as a result of patient requested rescheduling due to lost medication    Patient contacted [facility] to report he will run out of medication on 09/29/21. communicated the delivery change. We will reschedule the medication for the delivery date that the patient agreed upon.  We have confirmed the delivery date as 09/27/21, via same day courier.     [name], PharmD  [facility]

## 2021-09-27 ENCOUNTER — Ambulatory Visit
Admit: 2021-09-27 | Discharge: 2021-09-27 | Disposition: A | Discharge status: Home / Self Care | Payer: PRIVATE HEALTH INSURANCE

## 2021-09-27 ENCOUNTER — Emergency Department
Admit: 2021-09-27 | Discharge: 2021-09-27 | Disposition: A | Discharge status: Home / Self Care | Payer: PRIVATE HEALTH INSURANCE

## 2021-09-27 DIAGNOSIS — J101 Influenza due to other identified influenza virus with other respiratory manifestations: Principal | ICD-10-CM

## 2021-09-27 DIAGNOSIS — J40 Bronchitis, not specified as acute or chronic: Principal | ICD-10-CM

## 2021-09-27 LAB — CBC W/ AUTO DIFF
BASOPHILS ABSOLUTE COUNT: 0 10*9/L (ref 0.0–0.1)
BASOPHILS RELATIVE PERCENT: 0 %
EOSINOPHILS ABSOLUTE COUNT: 0 10*9/L (ref 0.0–0.5)
EOSINOPHILS RELATIVE PERCENT: 0.1 %
HEMATOCRIT: 45.1 % (ref 39.0–48.0)
HEMOGLOBIN: 16.1 g/dL (ref 12.9–16.5)
LYMPHOCYTES ABSOLUTE COUNT: 0.8 10*9/L — ABNORMAL LOW (ref 1.1–3.6)
LYMPHOCYTES RELATIVE PERCENT: 9.6 %
MEAN CORPUSCULAR HEMOGLOBIN CONC: 35.7 g/dL (ref 32.0–36.0)
MEAN CORPUSCULAR HEMOGLOBIN: 34.4 pg — ABNORMAL HIGH (ref 25.9–32.4)
MEAN CORPUSCULAR VOLUME: 96.3 fL — ABNORMAL HIGH (ref 77.6–95.7)
MEAN PLATELET VOLUME: 7.2 fL (ref 6.8–10.7)
MONOCYTES ABSOLUTE COUNT: 0.5 10*9/L (ref 0.3–0.8)
MONOCYTES RELATIVE PERCENT: 6.2 %
NEUTROPHILS ABSOLUTE COUNT: 7.2 10*9/L (ref 1.8–7.8)
NEUTROPHILS RELATIVE PERCENT: 84.1 %
NUCLEATED RED BLOOD CELLS: 0 /100{WBCs} (ref <=4)
PLATELET COUNT: 142 10*9/L — ABNORMAL LOW (ref 150–450)
RED BLOOD CELL COUNT: 4.68 10*12/L (ref 4.26–5.60)
RED CELL DISTRIBUTION WIDTH: 15.5 % — ABNORMAL HIGH (ref 12.2–15.2)
WBC ADJUSTED: 8.6 10*9/L (ref 3.6–11.2)

## 2021-09-27 LAB — SLIDE REVIEW

## 2021-09-27 LAB — COMPREHENSIVE METABOLIC PANEL
ALBUMIN: 3.7 g/dL (ref 3.4–5.0)
ALKALINE PHOSPHATASE: 43 U/L — ABNORMAL LOW (ref 46–116)
ALT (SGPT): 39 U/L (ref 10–49)
ANION GAP: 5 mmol/L (ref 5–14)
AST (SGOT): 19 U/L (ref <=34)
BILIRUBIN TOTAL: 0.6 mg/dL (ref 0.3–1.2)
BLOOD UREA NITROGEN: 10 mg/dL (ref 9–23)
BUN / CREAT RATIO: 16
CALCIUM: 8.7 mg/dL (ref 8.7–10.4)
CHLORIDE: 108 mmol/L — ABNORMAL HIGH (ref 98–107)
CO2: 25.8 mmol/L (ref 20.0–31.0)
CREATININE: 0.63 mg/dL
EGFR CKD-EPI (2021) MALE: >90 mL/min/{1.73_m2} (ref >=60)
GLUCOSE RANDOM: 195 mg/dL — ABNORMAL HIGH (ref 70–179)
POTASSIUM: 4.2 mmol/L (ref 3.4–4.8)
PROTEIN TOTAL: 6.6 g/dL (ref 5.7–8.2)
SODIUM: 139 mmol/L (ref 135–145)

## 2021-09-27 MED ORDER — AMOXICILLIN 875 MG-POTASSIUM CLAVULANATE 125 MG TABLET
ORAL_TABLET | Freq: Two times a day (BID) | ORAL | 0 refills | 10 days | Status: CP
Start: 2021-09-27 — End: 2021-10-07

## 2021-09-27 MED ORDER — OSELTAMIVIR 75 MG CAPSULE
ORAL_CAPSULE | Freq: Two times a day (BID) | ORAL | 0 refills | 5 days | Status: CP
Start: 2021-09-27 — End: 2021-10-02

## 2021-09-27 MED ORDER — DOXYCYCLINE HYCLATE 100 MG CAPSULE
ORAL_CAPSULE | Freq: Two times a day (BID) | ORAL | 0 refills | 10 days | Status: CP
Start: 2021-09-27 — End: 2021-10-07

## 2021-09-27 MED FILL — IMBRUVICA 560 MG TABLET: ORAL | 28 days supply | Qty: 28 | Fill #1

## 2021-10-04 ENCOUNTER — Ambulatory Visit: Admit: 2021-10-04 | Discharge: 2021-10-05 | Payer: PRIVATE HEALTH INSURANCE

## 2021-10-04 ENCOUNTER — Other Ambulatory Visit: Admit: 2021-10-04 | Discharge: 2021-10-05 | Payer: PRIVATE HEALTH INSURANCE

## 2021-10-04 DIAGNOSIS — C8330 Diffuse large B-cell lymphoma, unspecified site: Principal | ICD-10-CM

## 2021-10-04 LAB — CBC W/ AUTO DIFF
BASOPHILS ABSOLUTE COUNT: 0 10*9/L (ref 0.0–0.1)
BASOPHILS RELATIVE PERCENT: 0.3 %
EOSINOPHILS ABSOLUTE COUNT: 0 10*9/L (ref 0.0–0.5)
EOSINOPHILS RELATIVE PERCENT: 0 %
HEMATOCRIT: 44.8 % (ref 39.0–48.0)
HEMOGLOBIN: 15.5 g/dL (ref 12.9–16.5)
LYMPHOCYTES ABSOLUTE COUNT: 2.2 10*9/L (ref 1.1–3.6)
LYMPHOCYTES RELATIVE PERCENT: 26.3 %
MEAN CORPUSCULAR HEMOGLOBIN CONC: 34.6 g/dL (ref 32.0–36.0)
MEAN CORPUSCULAR HEMOGLOBIN: 33.6 pg — ABNORMAL HIGH (ref 25.9–32.4)
MEAN CORPUSCULAR VOLUME: 96.9 fL — ABNORMAL HIGH (ref 77.6–95.7)
MEAN PLATELET VOLUME: 7.6 fL (ref 6.8–10.7)
MONOCYTES ABSOLUTE COUNT: 0.5 10*9/L (ref 0.3–0.8)
MONOCYTES RELATIVE PERCENT: 5.8 %
NEUTROPHILS ABSOLUTE COUNT: 5.6 10*9/L (ref 1.8–7.8)
NEUTROPHILS RELATIVE PERCENT: 67.6 %
PLATELET COUNT: 153 10*9/L (ref 150–450)
RED BLOOD CELL COUNT: 4.62 10*12/L (ref 4.26–5.60)
RED CELL DISTRIBUTION WIDTH: 15.6 % — ABNORMAL HIGH (ref 12.2–15.2)
WBC ADJUSTED: 8.2 10*9/L (ref 3.6–11.2)

## 2021-10-04 LAB — SLIDE REVIEW

## 2021-10-04 NOTE — Unmapped (Signed)
Blood specimen's obtained via peripheral blood draw and sent to lab for processing. See phlebotomy flowsheet for specific. Wojciech Willetts, RN

## 2021-10-04 NOTE — Unmapped (Signed)
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  Regimen: MATRIX (Methotrexate, cytarabine, thiotepa, rituximab): started 06/07/20-08/10/20 for 4 cycles --> pr-->single agent methotrexate x 3 cycles --> further decrease in size of masses   Not a transplant candidate due to poor PFTs and active smoking   Whole brain radiation 2340cGy given at 180cGy x 13 from 01/17/21-02/04/21 --> CR  Relapse - progression seen on routine MRI scan in new sites 08/15/2021 , seizure like activity noted 12/1 and keppra started  Regimen #2:  5 fractions of Cyberknife to involved areas (2500cGy) completed 09/06/21  Ibrutinib started 09/06/21      CNS lymphoma:  HE received cyberknife as a palliative measure to decrease the known masses as quickly as possible. This is largely a palliative measure, particularly since it is likely that he will develop other lesions in the brain without alternative therapy. Therefore, we started him on treatment with ibrutinib.  There is data for ibrutinib, a first generation BTKi (Soussain et al. Eur J Conacer 2019).  A phase II study of 44 evaluable patients with rel/ref primary CNS DLBCL demonstrated that after 2 months of treatment the disease control rate was 70% with a CR in 19%, PR in 33% and stable disease in 10%.  After a median follow up of 25.7 months, the median BFS was 48 months and left eye was 19.2 months.  13/44 patients received ibrutinib for over 12 months.  Ibrutinib was detectable in the CSF fluid.  Two patients developed pulmonary aspergillosis. NCCN recommendations include ibrutinib.     He is tolerating ibrutinib well without significant cytopenias.  He is on cycle 2 of treatment. We will continue at the same dose.  Return in 1 month for repeat labs and brain MRI.    If he has a good response to treatment, we could consider autoSCT. We deferred it initially because of his poor PFTs showing that he would be at high risk for BCNU pulmonary toxicity.  He is smoking less now and doesn't think that he tried his hardest for the PFTs so his funciton may be better now.   But, even if the PFTs were not improved, the prognosis from the CNS lymphoma is poor enough that the risks and benefits of transplant would be different at this time and more in favor of transplant.     To do:  1. Continue ibrutinib at current dose  2.  Repeat MRI on 11/06/20  3. Continue dex taper  4.. Continue keppra w/ neuro follow up in January  5. Return to see me in 4 weeks    Clarene Critchley, MD  Associate Professor  Division of Hematology    ============  Interval Hx:  On 12/16 he had a fall. His leg gave out. They didn't think that it was seizure.  He did not have any confusion.He talked to Dr. Baird Lyons and per her note she recommended increasing his dexamethasone back up to 4mg  bid form the 4mg  in the  AM and 2mg  in the morning dose.  That is not his understanding of the call and he continued to taper it down. He is now on 2mg  bid.  He has not had any more falls or unusual neurologic symptoms or seizure like activity.    He was diagnosed with influenza on 09/27/21.  He was treated with 5 days of tamiflu.  CXR was negative but he was treated with antibiotics since he was on immunosuppression.    He is a little tired.  He says he can only work a few days a week, but his partner says he works every day.    MEDICATIONS:  Current Outpatient Medications   Medication Sig Dispense Refill   ??? albuterol HFA 90 mcg/actuation inhaler Inhale 2 puffs every six (6) hours as needed for wheezing.      ??? amoxicillin-clavulanate (AUGMENTIN) 875-125 mg per tablet Take 1 tablet by mouth Two (2) times a day for 10 days. 20 tablet 0   ??? atorvastatin (LIPITOR) 20 MG tablet Take 1 tablet (20 mg total) by mouth daily. 90 tablet 3   ??? cetirizine (ZYRTEC) 10 MG tablet Take 10 mg by mouth daily.     ??? cholecalciferol, vitamin D3-50 mcg, 2,000 unit,, 50 mcg (2,000 unit) tablet Take 2,000 Units by mouth daily.     ??? doxycycline (VIBRAMYCIN) 100 MG capsule Take 1 capsule (100 mg total) by mouth Two (2) times a day for 10 days. 20 capsule 0   ??? escitalopram oxalate (LEXAPRO) 20 MG tablet Take 1 tablet (20 mg total) by mouth daily. 90 tablet 2   ??? finasteride (PROSCAR) 5 mg tablet Take 5 mg by mouth daily.     ??? fish oil/borage/flax/om3,6,9 1 (OMEGA 3-6-9 COMPLEX ORAL) Take 1 capsule by mouth daily.     ??? fluoride, sodium, (SF 5000 PLUS) 1.1 % Crea USE  SMALL AMOUNT TWICE DAILY 51 g 1   ??? ibrutinib (IMBRUVICA) 560 mg tablet Take 1 tablet (560 mg total) by mouth daily. 28 tablet 11   ??? levETIRAcetam (KEPPRA) 500 MG tablet Take 1 tablet (500 mg total) by mouth Two (2) times a day. 60 tablet 6   ??? mirtazapine (REMERON) 30 MG tablet Take 1 tablet (30 mg total) by mouth nightly. 90 tablet 1   ??? multivitamin (TAB-A-VITE/THERAGRAN) per tablet Take 1 tablet by mouth daily.     ??? OLANZapine (ZYPREXA) 5 MG tablet Take 1 tablet (5 mg total) by mouth nightly. 90 tablet 1   ??? chlorhexidine (PERIDEX) 0.12 % solution Rinse with 15 mL of the solution for one minute and spit. Use 1-2 times per day.  Do not eat or drink for 30 min after use. (Patient not taking: Reported on 09/13/2021) 473 mL prn     No current facility-administered medications for this visit.         REVIEW OF SYSTEMS:  See HPI. A 10 system ROS is otherwise negative.    VITAL SIGNS:   Vitals:    10/04/21 0907   BP: 129/90   Pulse: 60   Resp: 20   Temp: 37.4 ??C (99.3 ??F)   TempSrc: Temporal   SpO2: 97%   Weight: 85.1 kg (187 lb 9.6 oz)   Height: 185.4 cm (6' 0.99)       EXAM:  Gen: NAD, Awake, Alert  LYMPH: No cervical, supraclavicular, axillary, or inguinal LAD  RESP: Clear to auscultation bilaterally.  CV: Regular rate and rhythm. No rubs, gallops or murmurs.   GI: Soft, nontender, nondistended. No hepatosplenomegaly.   MSK: No edema.  NEURO: Strenght is 5/5 in BLE and in RUE.   He is alert and oriented x 3.   His Cranial nerves are intact (hearing and vision weren't checked).  He walks unassisted but his stride is probably smaller than his baseline. EOMI,  Skin/derm: No rash      LABORATORY:  Lab on 10/04/2021   Component Date Value Ref Range Status   ??? WBC 10/04/2021 8.2  3.6 - 11.2  10*9/L Final   ??? RBC 10/04/2021 4.62  4.26 - 5.60 10*12/L Final   ??? HGB 10/04/2021 15.5  12.9 - 16.5 g/dL Final   ??? HCT 60/45/4098 44.8  39.0 - 48.0 % Final   ??? MCV 10/04/2021 96.9 (H)  77.6 - 95.7 fL Final   ??? MCH 10/04/2021 33.6 (H)  25.9 - 32.4 pg Final   ??? MCHC 10/04/2021 34.6  32.0 - 36.0 g/dL Final   ??? RDW 11/91/4782 15.6 (H)  12.2 - 15.2 % Final   ??? MPV 10/04/2021 7.6  6.8 - 10.7 fL Final   ??? Platelet 10/04/2021 153  150 - 450 10*9/L Final   ??? Neutrophils % 10/04/2021 67.6  % Final   ??? Lymphocytes % 10/04/2021 26.3  % Final   ??? Monocytes % 10/04/2021 5.8  % Final   ??? Eosinophils % 10/04/2021 0.0  % Final   ??? Basophils % 10/04/2021 0.3  % Final   ??? Absolute Neutrophils 10/04/2021 5.6  1.8 - 7.8 10*9/L Final   ??? Absolute Lymphocytes 10/04/2021 2.2  1.1 - 3.6 10*9/L Final   ??? Absolute Monocytes 10/04/2021 0.5  0.3 - 0.8 10*9/L Final   ??? Absolute Eosinophils 10/04/2021 0.0  0.0 - 0.5 10*9/L Final   ??? Absolute Basophils 10/04/2021 0.0  0.0 - 0.1 10*9/L Final   ??? Smear Review Comments 10/04/2021 See Comment (A)  Undefined Final   ??? Neutrophil Left Shift 10/04/2021 Present (A)  Not Present Final

## 2021-10-04 NOTE — Unmapped (Signed)
CyberKnife Procedure Note for Neurosurgery  Brain Metastases Treatment    Date of Plan review: 09/04/21  Date of start of treatment: 09/02/21    I or my team met with the patient prior to planning and described the treatment planning process, the rationale for the CyberKnife treatment, and why it was preferred to other treatment options.      Attending: Barron Alvine  Radiation Oncology Attending: Baird Lyons  Resident(s): none    Primary Tumor: CNS lymphoma  Number of lesions: 2    Lesions larger than 3.5 cm: None > 3.5 cm, simple lesion    Radiation to be delivered by:  Linear Accelerator, utilizing CyberKnife  Treatment Plan created using interpretation of MRI scan and CT scanning  I confirmed the identification of the normal structures on the treatment plan  I reviewed and helped created the treatment target volumes  I reviewed the dose administered and the number of fractions selected   (fractions =  5 )  The dose we selected was: 2500 (cGy)     Plan created by the multidiscipilinary team   Treatment reviewed by both radiation oncology and neurosurgery   QA performed, planned carried out as presecribed   Complete record available in CyberKnife Treatment planning system in Radiation Oncology Department.     Attending Attestation:     I created or reviewed the treatment plan and approved the critical structures.  I was involved in identifying the treatment volume.  I reviewed the radiation dosing to the target and surrounding normal structures.  I approved the plan for delievery.   On the day of treatment, I again reviewed the plan and agreed with the delivery of the planned radiosurgery

## 2021-10-04 NOTE — Unmapped (Signed)
Continue steroid taper.  Continue the lenalidomide (revlimid) treatment.  Return in one month.

## 2021-10-08 ENCOUNTER — Ambulatory Visit: Admit: 2021-10-08 | Payer: PRIVATE HEALTH INSURANCE

## 2021-10-22 ENCOUNTER — Ambulatory Visit
Admit: 2021-10-22 | Discharge: 2021-10-23 | Payer: PRIVATE HEALTH INSURANCE | Attending: Student in an Organized Health Care Education/Training Program | Primary: Student in an Organized Health Care Education/Training Program

## 2021-10-22 DIAGNOSIS — F09 Unspecified mental disorder due to known physiological condition: Principal | ICD-10-CM

## 2021-10-22 DIAGNOSIS — R569 Unspecified convulsions: Principal | ICD-10-CM

## 2021-10-22 MED ORDER — MEMANTINE 5 MG TABLET
ORAL_TABLET | ORAL | 1 refills | 0.00000 days | Status: CP
Start: 2021-10-22 — End: ?

## 2021-10-22 MED ORDER — LEVETIRACETAM 500 MG TABLET
ORAL_TABLET | Freq: Two times a day (BID) | ORAL | 4 refills | 30 days | Status: CP
Start: 2021-10-22 — End: 2021-11-21

## 2021-10-22 NOTE — Unmapped (Signed)
Outpatient Neurology Consult Note     [facility]  [address]  [address]  [place] [address]    Date: 10/22/2021  Patient Name: Andre Duncan  MRN: [id number]  PCP: Perley Jain, Magnus Ivan, FNP     Assessment and Plan        Andre Duncan is a 56 y.o. male presenting in consultation for evaluation following ED presentation for seizure like activity.    Seizure    Patient appears to have at least 1 seizure (12/22), fall with loss of consciousness followed by 30 minutes of confusion.  Was started on Keppra 500 mg twice daily in the emergency department subsequently.  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  Patient also possibly had a second seizure on 09/20/21, where he fell and reports he lost consciousness, however there is no postictal confusion.  Since then patient and patient's wife deny any seizure-like activity.  Since patient has not had any seizure-like activity in at least a month (unclear if episode on 12/16 was a seizure), we believe it is reasonable to keep Keppra at its current 500 mg twice daily dosage, which patient is tolerating well thus far.  And further make ASM adjustments if has any additional seizures.      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  However, unfortunately patient appears to have developed cognitive deficits, such as word finding difficulty, impairments  with memory as well as visual-spatial deficits (which appear to correlate with his R parietal lobe lesion).  Patient and wife feel like the memantine he was previously on was effective.  Additionally pts wife states over the past 2 weeks she feels like his cognition has been declining, and request his memantine to be resumed.  MoCA was performed with greatest deficits appearing to be in visual-spatial/executive functioning as well as language, abstraction and delayed recall.  With attention being the most preserved. Overall MoCA score 19.  Given patient reportedly did well with memantine in the past, we believe it is reasonable to do a trial with the up titration as below, and perform repeat MoCA at follow-up visit.       Plan:  -Continue Keppra 500 mg twice daily  -Instructed patient, that [place] law states that he must be seizure-free for at least 6 months before he can resume driving, and that he needs to report his seizure to the [facility].  -Memantine trial (5mg /day for 1 week. Then, 5 mg twice a day for 1 week. Then 10mg  in morning with 5mg  at night, for one week. ??Then 10mg  twice a day and then stay at 10mg  twice a day)  -Repeat MoCA at follow-up appointment  -Continue close follow-up with hematology and radiation oncology      Return Visit in: 2 months    I personally spent 50 minutes face-to-face and non-face-to-face in the care of this patient, which includes all pre, intra, and post visit time on the date of service.           Patient was seen and discussed with Dr.Lau who agrees with assessment and plan.    Leo Grosser, MD Neurology (PGY-2)           HPI  HPI:   Andre Duncan is a 56 y.o. male with CNS lymphoma  presenting in consultation for evaluation following ED presentation for seizure like activity.      Patient has CNS lymphoma, received chemotherapy as well as whole brain radiation, with recent relapse in November 2022 and subsequently restarted on radiation and Ibrutinib.  On September 05, 2021 he presented to the emergency department following an episode concerning for seizure.  Where he had an unwitnessed fall, his wife heard a thud came to check on him and saw him awake lying on the floor, however appeared very confused.  He subsequently  presented to the emergency department where, MRI showed no new lesions or enhancement, but persistent vasogenic edema most notable in right frontoparietal regions.  In the emergency department he appeared to be at his normal baseline and neuro exam was nonfocal.  He subsequently was discharged on 500 mg of Keppra twice daily (no load).  He reports he is tolerating this well without side effect.  Of note on September 20, 2021 he had a fall where he reports he lost consciousness (wife states that he did not lose consciousness) however there was no confusion afterwards.  Aside from this patient and wife deny any seizure-like activity or loss of consciousness episodes.  Since radiation patient's wife reports that his cognition has been declining.  He was started on Memantine for neurocognitive protection prophylaxis during his radiation last year.  Wife states she feels like his cognition was much better when he was on the memantine, however he was only on it for 6 months reportedly as part of the radiation protocol.  The wife says his cognitive deficits mostly include word finding difficulty and issues with short-term memory.  She feels like over the past 7 to 10 days his cognition is gradually getting worse.        Allergies   Allergen Reactions   ??? Bactrim [Sulfamethoxazole-Trimethoprim]    ??? Sulfa (Sulfonamide Antibiotics) Rash     Developed during hospitalization d/c    ??? Tegaderm Adhesive-No Drug-Allergy Check Other (See Comments)        Current Outpatient Medications   Medication Sig Dispense Refill   ??? albuterol HFA 90 mcg/actuation inhaler Inhale 2 puffs every six (6) hours as needed for wheezing.      ??? cholecalciferol, vitamin D3-50 mcg, 2,000 unit,, 50 mcg (2,000 unit) tablet Take 2,000 Units by mouth daily.     ??? escitalopram oxalate (LEXAPRO) 20 MG tablet Take 1 tablet (20 mg total) by mouth daily. 90 tablet 2   ??? finasteride (PROSCAR) 5 mg tablet Take 5 mg by mouth daily.     ??? fish oil/borage/flax/om3,6,9 1 (OMEGA 3-6-9 COMPLEX ORAL) Take 1 capsule by mouth daily.     ??? fluoride, sodium, (SF 5000 PLUS) 1.1 % Crea USE  SMALL AMOUNT TWICE DAILY 51 g 1   ??? ibrutinib (IMBRUVICA) 560 mg tablet Take 1 tablet (560 mg total) by mouth daily. 28 tablet 11   ??? mirtazapine (REMERON) 30 MG tablet Take 1 tablet (30 mg total) by mouth nightly. 90 tablet 1   ??? multivitamin (TAB-A-VITE/THERAGRAN) per tablet Take 1 tablet by mouth daily.     ??? OLANZapine (ZYPREXA) 5 MG tablet Take 1 tablet (5 mg total) by mouth nightly. 90 tablet 1   ??? atorvastatin (LIPITOR) 20 MG tablet Take 1 tablet (20 mg total) by mouth daily. (Patient not taking: Reported on 10/22/2021) 90 tablet 3   ??? cetirizine (ZYRTEC) 10 MG  tablet Take 10 mg by mouth daily as needed.     ??? chlorhexidine (PERIDEX) 0.12 % solution Rinse with 15 mL of the solution for one minute and spit. Use 1-2 times per day.  Do not eat or drink for 30 min after use. (Patient not taking: Reported on 09/13/2021) 473 mL prn   ??? levETIRAcetam (KEPPRA) 500 MG tablet Take 1 tablet (500 mg total) by mouth Two (2) times a day. 60 tablet 4   ??? memantine (NAMENDA) 5 MG tablet 5 mg/day for 1 week. Then, 5 mg twice a dail for 1 week. Then  10mg  in morning with 5mg  at night, for one week.  Then 10mg  twice a day and then stay at 10mg  twice a day 90 tablet 1     No current facility-administered medications for this visit.       Past Medical History:   Diagnosis Date   ??? Anxiety    ??? COPD (chronic obstructive pulmonary disease) (CMS-HCC)    ??? Cytarabine poisoning    ??? Depression    ??? Diabetes mellitus (CMS-HCC)    ??? Pneumonia, aspiration (CMS-HCC)    ??? Prostate atrophy        No past surgical history on file.    Social History     Socioeconomic History   ??? Marital status: Married     Spouse name: None   ??? Number of children: None   ??? Years of education: None   ??? Highest education level: None   Tobacco Use   ??? Smoking status: Every Day     Packs/day: 1.00     Years: 30.00     Pack years: 30.00     Types: Cigarettes   ??? Smokeless tobacco: Never   Vaping Use   ??? Vaping Use: Never used   Substance and Sexual Activity   ??? Alcohol use: Yes     Alcohol/week: 4.0 standard drinks     Types: 4 Cans of beer per week   ??? Drug use: Never   ??? Sexual activity: Yes     Partners: Female     Social Determinants of Health     Financial Resource Strain: Medium Risk   ??? Difficulty of Paying Living Expenses: Somewhat hard   Food Insecurity: No Food Insecurity   ??? Worried About Running Out of Food in the Last Year: Never true   ??? Ran Out of Food in the Last Year: Never true   Transportation Needs: No Transportation Needs   ??? Lack of Transportation (Medical): No   ??? Lack of Transportation (Non-Medical): No       Family History   Problem Relation Age of Onset   ??? Cancer Mother    ??? Diabetes Mother    ??? Cancer Father             Objective        Vital signs: BP 145/85 (BP Site: L Arm, BP Position: Sitting, BP Cuff Size: Large)  - Pulse 74  - Ht 185.4 cm (6' 0.99)  - Wt 87.1 kg (192 lb)  - BMI 25.34 kg/m??      Vitals:    10/22/21 1500   BP Site: L Arm   BP Position: Sitting   BP Cuff Size: Large       Physical Exam:  .General Appearance: Well appearing. In no acute distress.  Eyes: Sclera are anicteric and without injection. No discharge.   HENT: Head is atraumatic and normocephalic. Oropharyngeal membranes  are moist with no erythema or exudate.  Neck: Grossly normal range of motion.  Lymphatic: Deferred.  Respiratory: Normal work of breathing.  Cardiovascular: Warm and well-perfused.  Gastrointestinal: Nondistended.    Extremities: No clubbing, cyanosis, or edema.  Skin: no rashes or significant lesions on examined skin.  Psychiatric: Behavior and affect are appropriate.     Neurological Examination:     Mental Status: Alert, conversant, able to follow conversation and interview. Spontaneous speech was fluent without word finding pauses, dysarthria, or paraphasic errors. Comprehension was intact. Memory for recent and remote events was intact.  See assessment for more thorough mental status exam with MoCA.    Cranial Nerves: PERRL. Pursuit eye movements were uninterrupted with full range and without more than end-gaze nystagmus. Facial sensation intact bilaterally to light touch in all three divisions of CNV. Face symmetric at rest. Normal facial movement bilaterally, including forehead, eye closure and grimace/smile. Hearing intact to conversation. Shoulder shrug full strength bilaterally. Palate movement is symmetric. Tongue protrudes midline and tongue movements are normal.     Motor Exam: Normal bulk.  No tremors, myoclonus, or other adventitious movement.  Pronator drift is absent.  UE R/L: deltoid 5/5, biceps 5/5, triceps 5/5, and hand grip strong/strong.  LE R/L: hip flexion 5/5, hip extension 5/5, hip abduction 5/5, hip adduction 5/5, quadriceps 5/5, hamstrings 5/5, dorsiflexion 5/5, and plantar flexion 5/5.    Reflexes: DTRs R/L: biceps 2+/2+, brachioradialis 2+/2+, triceps 2+/2+, patella 2+/2+, and ankle jerk 2+/2+. Toes are downgoing bilaterally. Hoffman Negative    Sensory: Intact and symmetric to light touch in all extremities     Cerebellar/Coordination/Gait:Gait exam demonstrates normal posture, base, stride length, arm swing and turns.          Diagnostic Studies and Review of Records   Labs:  All Labs Last 24hrs: No results found for this or any previous visit (from the past 24 hour(s)).    Imaging:  MRI brian personally reviewed:  IMPRESSION:  -Interval decrease in size of enhancing lesions in the right centrum semiovale and right parietal lobe. Additionally, there has been a slight interval decrease in surrounding vasogenic edema. No new enhancing lesions.  ??           Specimen Collected: 09/04/21 21:46

## 2021-10-23 NOTE — Unmapped (Signed)
[facility] Refill Coordination Note    Specialty Medication(s) to be Shipped:   Hematology/Oncology: Imbruvica    Other medication(s) to be shipped: No additional medications requested for fill at this time     Andre Duncan, DOB: [birth year]  Phone: [phone] (home)       All above HIPAA information was verified with patient's family member, Spouse.     Was a Nurse, learning disability used for this call? No    Completed refill call assessment today to schedule patient's medication shipment from the [facility] [phone]).  All relevant notes have been reviewed.     Specialty medication(s) and dose(s) confirmed: Regimen is correct and unchanged.   Changes to medications: Zackry reports no changes at this time.  Changes to insurance: No  New side effects reported not previously addressed with a pharmacist or physician: None reported  Questions for the pharmacist: No    Confirmed patient received a Conservation officer, historic buildings and a Surveyor, mining with first shipment. The patient will receive a drug information handout for each medication shipped and additional FDA Medication Guides as required.       DISEASE/MEDICATION-SPECIFIC INFORMATION        N/A    SPECIALTY MEDICATION ADHERENCE     Medication Adherence    Patient reported X missed doses in the last month: 0  Specialty Medication: Imbruvica 560 mg  Patient is on additional specialty medications: No  Informant: spouse              Were doses missed due to medication being on hold? No    Imbruvica 560 mg: 10 days of medicine on hand       REFERRAL TO PHARMACIST     Referral to the pharmacist: Not needed      [facility]     Shipping address confirmed in Epic.     Delivery Scheduled: Yes, Expected medication delivery date: 10/30/21.     Medication will be delivered via Next Day Courier to the prescription address in Epic [place].    [name]   [facility] Specialty Technician

## 2021-10-23 NOTE — Unmapped (Addendum)
We will do a trail of Memantine     (5 mg/day for 1 week. Then, 5 mg twice a dail for 1 week. Then  10mg  in morning with 5mg  at night, for one week.  Then 10mg  twice a day and then stay at 10mg  twice a day)       to try to help with cognition. At your next visit with [place] we can reassess, to see if it is helping. In order to prevent seizures please continue taking the Keppra (500mg  BID), if any new seizures please return to the ED. Also since seizures can happen without warning please refrain from doing activities that would dangerous if you were to pass out such as swim un supervised, operate heavy machinery or drive. Also the Law in [place] states that you cannot drive for at least 6 months follow a seizure, and that you must report your seizure to the [facility]

## 2021-10-29 MED FILL — IMBRUVICA 560 MG TABLET: ORAL | 28 days supply | Qty: 28 | Fill #2

## 2021-11-02 ENCOUNTER — Emergency Department
Admit: 2021-11-02 | Discharge: 2021-11-03 | Disposition: A | Discharge status: Home / Self Care | Payer: PRIVATE HEALTH INSURANCE | Attending: Emergency Medicine

## 2021-11-02 ENCOUNTER — Ambulatory Visit
Admit: 2021-11-02 | Discharge: 2021-11-03 | Disposition: A | Discharge status: Home / Self Care | Payer: PRIVATE HEALTH INSURANCE | Attending: Emergency Medicine

## 2021-11-02 DIAGNOSIS — R739 Hyperglycemia, unspecified: Principal | ICD-10-CM

## 2021-11-02 DIAGNOSIS — J189 Pneumonia, unspecified organism: Principal | ICD-10-CM

## 2021-11-02 LAB — COMPREHENSIVE METABOLIC PANEL
ALBUMIN: 3.7 g/dL (ref 3.4–5.0)
ALKALINE PHOSPHATASE: 41 U/L — ABNORMAL LOW (ref 46–116)
ALT (SGPT): 40 U/L (ref 10–49)
ANION GAP: 6 mmol/L (ref 5–14)
AST (SGOT): 18 U/L (ref <=34)
BILIRUBIN TOTAL: 0.9 mg/dL (ref 0.3–1.2)
BLOOD UREA NITROGEN: 14 mg/dL (ref 9–23)
BUN / CREAT RATIO: 23
CALCIUM: 8.9 mg/dL (ref 8.7–10.4)
CHLORIDE: 107 mmol/L (ref 98–107)
CO2: 22.5 mmol/L (ref 20.0–31.0)
CREATININE: 0.61 mg/dL
EGFR CKD-EPI (2021) MALE: >90 mL/min/{1.73_m2} (ref >=60)
GLUCOSE RANDOM: 356 mg/dL — ABNORMAL HIGH (ref 70–179)
POTASSIUM: 4.6 mmol/L (ref 3.4–4.8)
PROTEIN TOTAL: 6.4 g/dL (ref 5.7–8.2)
SODIUM: 135 mmol/L (ref 135–145)

## 2021-11-02 LAB — CBC W/ AUTO DIFF
BASOPHILS ABSOLUTE COUNT: 0 10*9/L (ref 0.0–0.1)
BASOPHILS RELATIVE PERCENT: 0.6 %
EOSINOPHILS ABSOLUTE COUNT: 0 10*9/L (ref 0.0–0.5)
EOSINOPHILS RELATIVE PERCENT: 0.1 %
HEMATOCRIT: 43.2 % (ref 39.0–48.0)
HEMOGLOBIN: 15.3 g/dL (ref 12.9–16.5)
LYMPHOCYTES ABSOLUTE COUNT: 2.2 10*9/L (ref 1.1–3.6)
LYMPHOCYTES RELATIVE PERCENT: 29.4 %
MEAN CORPUSCULAR HEMOGLOBIN CONC: 35.5 g/dL (ref 32.0–36.0)
MEAN CORPUSCULAR HEMOGLOBIN: 35.5 pg — ABNORMAL HIGH (ref 25.9–32.4)
MEAN CORPUSCULAR VOLUME: 99.8 fL — ABNORMAL HIGH (ref 77.6–95.7)
MEAN PLATELET VOLUME: 8 fL (ref 6.8–10.7)
MONOCYTES ABSOLUTE COUNT: 0.4 10*9/L (ref 0.3–0.8)
MONOCYTES RELATIVE PERCENT: 5.5 %
NEUTROPHILS ABSOLUTE COUNT: 4.9 10*9/L (ref 1.8–7.8)
NEUTROPHILS RELATIVE PERCENT: 64.4 %
NUCLEATED RED BLOOD CELLS: 0 /100{WBCs} (ref <=4)
PLATELET COUNT: 175 10*9/L (ref 150–450)
RED BLOOD CELL COUNT: 4.33 10*12/L (ref 4.26–5.60)
RED CELL DISTRIBUTION WIDTH: 16.7 % — ABNORMAL HIGH (ref 12.2–15.2)
WBC ADJUSTED: 7.6 10*9/L (ref 3.6–11.2)

## 2021-11-02 LAB — BLOOD GAS, VENOUS
BASE EXCESS VENOUS: 1.1 (ref -2.0–2.0)
HCO3 VENOUS: 25 mmol/L (ref 22–27)
O2 SATURATION VENOUS: 89.8 % — ABNORMAL HIGH (ref 40.0–85.0)
PCO2 VENOUS: 39 mmHg — ABNORMAL LOW (ref 40–60)
PH VENOUS: 7.42 (ref 7.32–7.43)
PO2 VENOUS: 58 mmHg — ABNORMAL HIGH (ref 30–55)

## 2021-11-02 LAB — HIGH SENSITIVITY TROPONIN I - SINGLE: HIGH SENSITIVITY TROPONIN I: <3 ng/L (ref <=53)

## 2021-11-02 LAB — SLIDE REVIEW

## 2021-11-02 MED ORDER — LEVOFLOXACIN 750 MG TABLET
ORAL_TABLET | Freq: Every day | ORAL | 0 refills | 7 days | Status: CP
Start: 2021-11-02 — End: 2021-11-09

## 2021-11-02 MED ORDER — METFORMIN 500 MG TABLET
ORAL_TABLET | Freq: Two times a day (BID) | ORAL | 0 refills | 30.00000 days | Status: CP
Start: 2021-11-02 — End: 2021-12-02

## 2021-11-02 MED ADMIN — ipratropium (ATROVENT) 0.02 % nebulizer solution 500 mcg: 500 ug | RESPIRATORY_TRACT | @ 22:00:00 | Stop: 2021-11-02

## 2021-11-02 MED ADMIN — iohexoL (OMNIPAQUE) 350 mg iodine/mL solution 75 mL: 75 mL | INTRAVENOUS | @ 22:00:00 | Stop: 2021-11-02

## 2021-11-02 MED ADMIN — albuterol 2.5 mg /3 mL (0.083 %) nebulizer solution 5 mg: 5 mg | RESPIRATORY_TRACT | @ 22:00:00 | Stop: 2021-11-02

## 2021-11-02 MED ADMIN — sodium chloride 0.9% (NS) bolus 1,000 mL: 1000 mL | INTRAVENOUS | @ 22:00:00 | Stop: 2021-11-02

## 2021-11-02 NOTE — Unmapped (Signed)
Patient presents here with c/o generalized weakness since few months got worse last week. H/o CNS lymphoma and cognitive impairment due to radiation therapy. C.o cough since a month. He was seen for lung infection in late December and was antibiotics for that for 10 days . Denies any fever/ chills

## 2021-11-03 MED ADMIN — levoFLOXacin (LEVAQUIN) tablet 750 mg: 750 mg | ORAL | @ 01:00:00 | Stop: 2021-11-02

## 2021-11-03 NOTE — Unmapped (Signed)
[facility]  Emergency Department Provider Note      ED Clinical Impression      Final diagnoses:   Hyperglycemia (Primary)   Pneumonia of both lower lobes due to infectious organism            Impression, Medical Decision Making, Progress Notes and Critical Care      Impression, Differential Diagnosis and Plan of Care    Patient is a 56 y.o. 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 He also has endorsed a shuffling gait, occasional difficulty recalling information, and occasional difficulty understanding what is going on.      On exam, the patient appears fatigued but non-toxic and in no acute distress. VS are hemodynamically stable. He has a trace expiratory wheeze throughout. 4/5 strength to the LLE. Heart rate and rhythm are regular.     Differential includes intracranial mass versus pulmonary infection including pneumonia versus COVID versus other respiratory virus.  Considered ACS although less likely.  Consider electrolyte disturbance versus hypoglycemia.  Will check    CT chest, CTA brain as we are unable to obtain MRI at this time.  Will check basic labs, troponin and RPP.      Independent Interpretation of Studies:   I have independently visualized the CTA chest and CT no PE, agree with areas of clear glass attenuation in the bilateral lower lobes.  I have independently visualized CTA brain and did not note large vessel occlusion.  External Records Reviewed: Patient's most recent discharge summary and Patient's most recent outpatient clinic note  Social determinants that significantly affected care: None applicable  Diagnostic tests considered but not performed: Considered CT head with perfusion to better assess for mass, however after discussion with radiology, feel CTA would be better to evaluate for vessel occlusion.  History obtained from other sources: Family    Additional Progress Notes       Labs remarkable for hyperglycemia without evidence of DKA.  Patient does state he has recently been on Decadron and has been eating multiple chocolates while in the department.  CT head was initially read as hypodensity within the left supraclinoid ICA, possible artifactual versus large vessel occlusion.  Patient does not have focal findings concerning for CVA on exam, however after discussion with radiology, we are unable to obtain MRI so we will obtain CTA brain.  The CTA brain did not show any evidence of large vessel occlusion or aneurysm.  CT chest did show areas of groundglass attenuation with diffuse bronchial wall thickening.  I have attempted to reach oncology, however was not able to discussed with fellow.  Patient and family would like to be discharged.  Given concern for infection in the lower lobes after treatment with doxycycline, will start patient on Levaquin which he has had in the past and tolerated.  RPP is pending at the time of signout.  We will start metformin for hyperglycemia.  Blood sugar is downtrending on repeat.  Patient is able to ambulate in the part without difficulty.  I strongly encouraged on follow-up should symptoms not improve as patient may require MRI.  Patient and family voiced understanding and agreeable to plan of be discharged home.      Portions of this record have been  created using [employer]. Dictation errors have been sought, but may not have been identified and corrected.    See chart and resident provider documentation for details.    ____________________________________________         History        Reason for Visit  Weakness      HPI   Andre Duncan is a 56 y.o. 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 The patient's wife at bedside reports the patient has endorsed a few months of generalized weakness that acutely worsened 2-3 weeks ago with a thick and tan-colored productive cough, increasing fatigue, drowsiness, and occasional shortness of breath. He uses an inhaler 3-4 times a day when his cough becomes severe. He also has endorsed a shuffling gait, occasional difficulty recalling information, and occasional difficulty understanding what is going on.  His wife notes he had a few episodes of not waking up with verbal stimulus.     The patient had influenza on 09/27/21 and got better on Tamiflu and Doxycycline. After they saw neurology on 10/22/21, he began to endorse increased fatigue, difficulty standing up, and dragging of his legs while ambulating. Neurology believed his symptoms were due to his radiology treatment. He started on Imbruvica on December 7th and was put on a dex taper. He stopped taking Decadron after this around the 10/04/21. He had a MRI and CT done on 09/04/21 when he had a seizure which showed no acute intracranial abnormalities or any new enhancing lesions. He denies any falls in the past 2 weeks.          Past Medical History:   Diagnosis Date   ??? Anxiety    ??? COPD (chronic obstructive pulmonary disease) (CMS-HCC)    ??? Cytarabine poisoning    ??? Depression    ??? Diabetes mellitus (CMS-HCC)    ??? Pneumonia, aspiration (CMS-HCC)    ??? Prostate atrophy        Patient Active Problem List   Diagnosis   ??? Primary CNS lymphoma (CMS-HCC)   ??? Tobacco use   ??? Neutropenia (CMS-HCC)   ??? Dental disease   ??? COPD (chronic obstructive pulmonary disease) (CMS-HCC)   ??? Diffuse large B cell lymphoma (CMS-HCC)   ??? Anxiety and depression   ??? BPH (benign prostatic hyperplasia)   ??? Seasonal allergies   ??? Insomnia   ??? Poor short term memory   ??? Hyperlipidemia   ??? Elevated glucose       No past surgical history on file.      Current Facility-Administered Medications:   ???  albuterol 2.5 mg /3 mL (0.083 %) nebulizer solution 5 mg, 5 mg, Nebulization, Once, Sherryl Barters, MD  ???  ipratropium (ATROVENT) 0.02 % nebulizer solution 500 mcg, 500 mcg, Nebulization, Once, Sherryl Barters, MD  ???  sodium chloride 0.9% (NS) bolus 1,000 mL, 1,000 mL, Intravenous, Once, Sherryl Barters, MD    Current Outpatient Medications:   ???  albuterol HFA 90 mcg/actuation inhaler, Inhale 2 puffs every six (6) hours as needed for wheezing. , Disp: , Rfl:   ???  cetirizine (ZYRTEC) 10 MG tablet, Take 10 mg by mouth daily as needed., Disp: , Rfl:   ???  cholecalciferol, vitamin D3-50 mcg, 2,000 unit,, 50 mcg (2,000 unit) tablet, Take 2,000 Units by mouth daily., Disp: , Rfl:   ???  escitalopram oxalate (LEXAPRO) 20 MG  tablet, Take 1 tablet (20 mg total) by mouth daily., Disp: 90 tablet, Rfl: 2  ???  finasteride (PROSCAR) 5 mg tablet, Take 5 mg by mouth daily., Disp: , Rfl:   ???  fish oil/borage/flax/om3,6,9 1 (OMEGA 3-6-9 COMPLEX ORAL), Take 1 capsule by mouth daily., Disp: , Rfl:   ???  fluoride, sodium, (SF 5000 PLUS) 1.1 % Crea, USE  SMALL AMOUNT TWICE DAILY, Disp: 51 g, Rfl: 1  ???  ibrutinib (IMBRUVICA) 560 mg tablet, Take 1 tablet (560 mg total) by mouth daily., Disp: 28 tablet, Rfl: 11  ???  levETIRAcetam (KEPPRA) 500 MG tablet, Take 1 tablet (500 mg total) by mouth Two (2) times a day., Disp: 60 tablet, Rfl: 4  ???  memantine (NAMENDA) 5 MG tablet, 5 mg/day for 1 week. Then, 5 mg twice a dail for 1 week. Then  10mg  in morning with 5mg  at night, for one week.  Then 10mg  twice a day and then stay at 10mg  twice a day, Disp: 90 tablet, Rfl: 1  ???  mirtazapine (REMERON) 30 MG tablet, Take 1 tablet (30 mg total) by mouth nightly., Disp: 90 tablet, Rfl: 1  ???  multivitamin (TAB-A-VITE/THERAGRAN) per tablet, Take 1 tablet by mouth daily., Disp: , Rfl:   ???  OLANZapine (ZYPREXA) 5 MG tablet, Take 1 tablet (5 mg total) by mouth nightly., Disp: 90 tablet, Rfl: 1    Allergies  Sulfa (sulfonamide antibiotics) and Tegaderm adhesive-no drug-allergy check    Family History   Problem Relation Age of Onset   ??? Cancer Mother    ??? Diabetes Mother    ??? Cancer Father        Social History  Social History     Tobacco Use   ??? Smoking status: Every Day     Packs/day: 1.00     Years: 30.00     Pack years: 30.00     Types: Cigarettes   ??? Smokeless tobacco: Never   Vaping Use   ??? Vaping Use: Never used   Substance Use Topics   ??? Alcohol use: Yes     Alcohol/week: 4.0 standard drinks     Types: 4 Cans of beer per week   ??? Drug use: Never          Physical Exam     This provider entered the patient's room: Yes:    ??? If this provider did not enter the room, a comprehensive physical exam was not able to be performed due to increased infection risk to themselves, other providers, staff and other patients), as well as to conserve personal protective equipment (PPE) utilization during the COVID-19 pandemic.    ??? If this provider did enter the patient room, the following was PPE worn: Surgical mask, eye protection and gloves     ED Triage Vitals [11/02/21 1150]   Enc Vitals Group      BP 136/82      Heart Rate 76      SpO2 Pulse       Resp 18      Temp 36.9 ??C (98.4 ??F)      Temp Source Oral      SpO2 96 %      Weight 86.2 kg (190 lb)      Height 1.88 m (6' 2)     Constitutional: Alert and oriented. Fatigued appearing and in no distress.  Eyes: Conjunctivae are normal.  ENT       Head: Normocephalic and atraumatic.  Nose: No congestion.       Mouth/Throat: Mucous membranes are moist.       Neck: No stridor.  Hematological/Lymphatic/Immunilogical: No cervical lymphadenopathy.  Cardiovascular: Normal rate, regular rhythm. Normal and symmetric distal pulses are present in all extremities.  Respiratory: Trace expiratory wheeze. Normal respiratory effort.   Gastrointestinal: Soft and nontender. There is no CVA tenderness.  Musculoskeletal: Normal range of motion in all extremities.       Right lower leg: No tenderness or edema.       Left lower leg: No tenderness or edema.  Neurologic: 4/5 strength to the LLE. Normal speech and language. No gross focal neurologic deficits are appreciated.  Skin: Skin is warm, dry and intact. No rash noted.  Psychiatric: Mood and affect are normal. Speech and behavior are normal.       Radiology     CT head WO contrast   Final Result   Hypodensity within the left supraclinoid ICA, could be artifactual secondary to beam hardening, however, this could also be secondary to large vessel occlusion in the appropriate clinical setting. If indicated, CTA would be helpful for further evaluation and confirmation.      White matter hypoattenuation similar to prior MRI possibly relating to treatment related changes.      Findings communicated to Dr. Mariana Kaufman.      XR Chest 2 views   Final Result      Unchanged linear left basilar opacities, likely atelectasis versus scarring. No acute airspace disease.           Procedures     None      Documentation assistance was provided by Osvaldo Human, Scribe, on November 02, 2021 4:15 PM ,  For Shaune Leeks, MD.      November 04, 2021 11:42 PM. Documentation assistance provided by the scribe. I was present during the time the encounter was recorded. The information recorded by the scribe was done at my direction and has been reviewed and validated by me.            Sherryl Barters, MD  11/04/21 [phone]

## 2021-11-04 NOTE — Unmapped (Signed)
RADIATION ONCOLOGY Follow up Note    Encounter Date: 11/06/2021  Patient Name: Andre Duncan  Medical Record Number: [id number]    Referring Physician: Dr. Elmon Kirschner    ASSESSMENT:  56 year old man with primary CNS lymphoma s/p matrix regimen, high dose methotrexate, and consolidative radiation with 2340cGy in 02/2021, now s/p CyberKnife 09/2021 for relapsed disease and currently receiving ibrutinib     MRI today shows good control and marked decrease in enhancement on T1 post    PLAN:  He will see Dr Elmon Kirschner tomorrow to discuss continuing ibrutinib   RTC in 2 mos with brain MRI as pt is high risk  Off steroids      Pneumonia:  Start incentive spirometer  Continue Levaquin    Refer to pulmonary for ? Ventilatory defect as noted on prior diffusion studies, pt was to have repeat but likely illness was cause of missed appt.     --------------------------    Interval History:  Pt is seen with his wife.  He has had a difficult month, he was seen in ER in November for syncope vs sz just prior to starting RT.  He was put on Keppra and remains on same without any further obvious episodes.  He again saw ER in December with an increase cough concerning for infection.  Wife reports he was coughing so much that he was passing out, she did not think he was having sz.  He was started on Augmentin and Doxy.  Just last week he was back in ER for weakness and cough and he was moved to Levaquin.  Wife notes that several factors likely play into pt condition  He is now off steroids which were causing major sleep disruptions.  As well the respiratory issue left him feeling poorly and very fatigued as well as being post RT  He saw neurology who put him back on Memantine on 1/17 and he is now up to 10 mg on same, partner thinks he is doing a bit better with the increase.  Pt is still on Levaquin as well    Functionally, he goes to the store nearly daily, he can walk with perhaps a slight leg lag on the L side.  He has lost some weight from illness but is eating well and wife is hopeful that pt may be starting to improve more.     MRI brain today was reviewed by myself and I called radiology  Dr Baird Lyons as well personally reviewed MRI imaging today and she notes some FLAIR abnormalities but great improvement on T1 post without evidence of any recurrent lymphoma, which radiology confirmed.       Oncology History   Primary CNS lymphoma (CMS-HCC)   11/28/2020 Initial Diagnosis    Primary CNS lymphoma (CMS-HCC)     11/30/2020 -  Chemotherapy    IP LYMPHOMA HIGH-DOSE METHOTREXATE  riTUXimab 375 mg/m2 (standard and rapid infusion) IV on day 1, vinCRIStine 1.4 mg/m2 (max 2 mg) IV on day 1, DOXOrubicin 50 mg/m2 IV on day 1, cyclophosphamide 750 mg/m2 IV on day 1, predniSONE 100 mg PO on days 1-5, methotrexate (high-dose) IV on Day 15 (even cycles), every 21-day.     01/03/2021 -  Radiation    Radiation Therapy Treatment Details (Noted on 01/03/2021)  Site: Midline Brain  Technique: 3D CRT  Goal: No goal specified  Planned Treatment Start Date: No planned start date specified     08/23/2021 -  Radiation    Radiation Therapy Treatment  Details (Noted on 08/23/2021)  Site: Bilateral Brain  Technique: SRS  Goal: No goal specified  Planned Treatment Start Date: No planned start date specified       ROS:  A 10 systems was negative except for pertinent positives noted in HPI.    Physical Examination:  VITAL SIGNS: Wt 86.1 kg (189 lb 12.8 oz)  - BMI 24.37 kg/m??   CONSTITUTIONAL:   Engaged middle aged male  EYES:  Sclera anicteric, EOMI,   Ears, Nose, Mouth, Throat:  No thrush but tongue has coating (encouraged to brush same), facial movements full and symmetric, tongue midline  NECK:  Supple  HEME/LYMPH/IMMUNO:  No palpable cervical LAN.  CV:  SI S2 RRR.     RESPIRATORY:  Decreased bs all fields  MUSCULOSKELETAL: Independent gait, no assistive devices.  5/5 upper and lower  PSYCHIATRIC:  Mood is normal.    EXTREMITIES:     No edema  NEUROLOGIC:  Oriented x 3. Imaging:    MRI brain reviewed today and compared to prior showing good response     -I saw and evaluated/examined the patient, participating in the key portions of the service.  I discussed the findings, assessment, and plan of care with the resident/APP. I personally reviewed all relevant data associated with this encounter including imaging, labwork, and prior records. I have edited and agree with the findings and plan as documented in the resident/APP's note.  Dana L. Baird Lyons, MD  Assistant Professor  Department of Radiation Oncology

## 2021-11-06 ENCOUNTER — Ambulatory Visit: Admit: 2021-11-06 | Discharge: 2021-11-06 | Payer: PRIVATE HEALTH INSURANCE

## 2021-11-06 ENCOUNTER — Ambulatory Visit: Admit: 2021-11-06 | Payer: PRIVATE HEALTH INSURANCE

## 2021-11-06 ENCOUNTER — Ambulatory Visit
Admit: 2021-11-06 | Discharge: 2021-11-07 | Payer: PRIVATE HEALTH INSURANCE | Attending: Radiation Oncology | Primary: Radiation Oncology

## 2021-11-06 MED ADMIN — gadobenate dimeglumine (MULTIHANCE) 529 mg/mL (0.1mmol/0.2mL) solution 17 mL: 17 mL | INTRAVENOUS | @ 15:00:00 | Stop: 2021-11-06

## 2021-11-06 NOTE — Unmapped (Signed)
11/06/2021      Subjective/Assessment/Recommendations: Pt in radiation oncology clinic today for follow up regarding radiation treatment to the brain. Pt finished treatment on 09/06/21.     1. Fatigue: Moderate and Severe  2. Chemo: Not administered  3. Pain: Denies pain  4. Neurologic status: Headache, Ataxia/balance, Vision, Weakness, Cognition and still has lingering headaches, vision issues, balance issues, slight weakness in both legs-shuffles legs to walk, seems more confused- pt saw neurology team and they put him back on memantine, which pts wife stated has been helping his memory since January 17th. Pt is no longer on steroid. Pts wife believes that it was impacting his memory as well, as pt was not able to sleep while he was taking it.  5. Psychosocial: Has home support   6. Pt went to ED Saturday 11/02/21, for increased shortness of breath, extreme cough-pt was coughing so violently that he was passing out. Pts wife states that CT was negative on Saturday. Pt diagnosed with pneumonia and placed on Levaquin.

## 2021-11-07 ENCOUNTER — Ambulatory Visit: Admit: 2021-11-07 | Discharge: 2021-11-07 | Payer: PRIVATE HEALTH INSURANCE

## 2021-11-07 ENCOUNTER — Other Ambulatory Visit: Admit: 2021-11-07 | Discharge: 2021-11-07 | Payer: PRIVATE HEALTH INSURANCE

## 2021-11-07 DIAGNOSIS — C8330 Diffuse large B-cell lymphoma, unspecified site: Principal | ICD-10-CM

## 2021-11-07 DIAGNOSIS — C8589 Other specified types of non-Hodgkin lymphoma, extranodal and solid organ sites: Principal | ICD-10-CM

## 2021-11-07 LAB — COMPREHENSIVE METABOLIC PANEL
ALBUMIN: 3.9 g/dL (ref 3.4–5.0)
ALKALINE PHOSPHATASE: 47 U/L (ref 46–116)
ALT (SGPT): 151 U/L — ABNORMAL HIGH (ref 10–49)
ANION GAP: 11 mmol/L (ref 5–14)
AST (SGOT): 46 U/L — ABNORMAL HIGH (ref <=34)
BILIRUBIN TOTAL: 0.9 mg/dL (ref 0.3–1.2)
BLOOD UREA NITROGEN: 16 mg/dL (ref 9–23)
BUN / CREAT RATIO: 23
CALCIUM: 9.1 mg/dL (ref 8.7–10.4)
CHLORIDE: 104 mmol/L (ref 98–107)
CO2: 22 mmol/L (ref 20.0–31.0)
CREATININE: 0.7 mg/dL
EGFR CKD-EPI (2021) MALE: >90 mL/min/{1.73_m2} (ref >=60)
GLUCOSE RANDOM: 207 mg/dL — ABNORMAL HIGH (ref 70–179)
POTASSIUM: 4.6 mmol/L (ref 3.4–4.8)
PROTEIN TOTAL: 7 g/dL (ref 5.7–8.2)
SODIUM: 137 mmol/L (ref 135–145)

## 2021-11-07 LAB — CBC W/ AUTO DIFF
BASOPHILS ABSOLUTE COUNT: 0 10*9/L (ref 0.0–0.1)
BASOPHILS RELATIVE PERCENT: 0.4 %
EOSINOPHILS ABSOLUTE COUNT: 0 10*9/L (ref 0.0–0.5)
EOSINOPHILS RELATIVE PERCENT: 0.1 %
HEMATOCRIT: 46.3 % (ref 39.0–48.0)
HEMOGLOBIN: 16.4 g/dL (ref 12.9–16.5)
LYMPHOCYTES ABSOLUTE COUNT: 2.8 10*9/L (ref 1.1–3.6)
LYMPHOCYTES RELATIVE PERCENT: 32.4 %
MEAN CORPUSCULAR HEMOGLOBIN CONC: 35.5 g/dL (ref 32.0–36.0)
MEAN CORPUSCULAR HEMOGLOBIN: 35.5 pg — ABNORMAL HIGH (ref 25.9–32.4)
MEAN CORPUSCULAR VOLUME: 99.9 fL — ABNORMAL HIGH (ref 77.6–95.7)
MEAN PLATELET VOLUME: 8.1 fL (ref 6.8–10.7)
MONOCYTES ABSOLUTE COUNT: 0.7 10*9/L (ref 0.3–0.8)
MONOCYTES RELATIVE PERCENT: 8.7 %
NEUTROPHILS ABSOLUTE COUNT: 5 10*9/L (ref 1.8–7.8)
NEUTROPHILS RELATIVE PERCENT: 58.4 %
PLATELET COUNT: 184 10*9/L (ref 150–450)
RED BLOOD CELL COUNT: 4.63 10*12/L (ref 4.26–5.60)
RED CELL DISTRIBUTION WIDTH: 15.9 % — ABNORMAL HIGH (ref 12.2–15.2)
WBC ADJUSTED: 8.5 10*9/L (ref 3.6–11.2)

## 2021-11-07 NOTE — Unmapped (Signed)
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  Regimen: MATRIX (Methotrexate, cytarabine, thiotepa, rituximab): started 06/07/20-08/10/20 for 4 cycles --> pr-->single agent methotrexate x 3 cycles --> further decrease in size of masses   Not a transplant candidate due to poor PFTs and active smoking   Whole brain radiation 2340cGy given at 180cGy x 13 from 01/17/21-02/04/21 --> CR  Relapse - progression seen on routine MRI scan in new sites 08/15/2021 , seizure like activity noted 12/1 and keppra started  Regimen #2:  5 fractions of Cyberknife to involved areas (2500cGy) completed 09/06/21  Ibrutinib started 09/06/21  MRI brain 11/06/21 - significantly improved      CNS lymphoma:  Andre Duncan received cyberknife as a palliative measure to decrease the known masses as quickly as possible. This is largely a palliative measure, particularly since it is likely that Andre Duncan will develop other lesions in the brain without alternative therapy. Therefore, we started him on treatment with ibrutinib.  There is data for ibrutinib, a first generation BTKi (Soussain et al. Eur J Conacer 2019).  A phase II study of 44 evaluable patients with rel/ref primary CNS DLBCL demonstrated that after 2 months of treatment the disease control rate was 70% with a CR in 19%, PR in 33% and stable disease in 10%.  After a median follow up of 25.7 months, the median BFS was 48 months and left eye was 19.2 months.  13/44 patients received ibrutinib for over 12 months.  Ibrutinib was detectable in the CSF fluid.  Two patients developed pulmonary aspergillosis. NCCN recommendations include ibrutinib.     Andre Duncan is tolerating ibrutinib well without significant cytopenias and MRI showing an excellent response to treatment although I can't definitely say how much of that is from radiation versus ibrutinib.  His functional status today is a little worse - Andre Duncan falls asleep during the treatment visit, which I haven't seen him do before.  But, with the good MRI, for now I am going to assume that this is related to his pneumonia and interrupted sleep for the past several weeks. Andre Duncan is having some cognitive decline as well, which could also be due to infection/fatigue vs. Radiation.  I think that it is less likely due to the lymphoma itself since scans are improved. This isn't a side effect that I associate with ibrutinib.     Andre Duncan is on cycle 3 of treatment. We will continue at the same dose.      We discussed consideration of transplant again.  I discussed with Dr. Sheryn Bison, but Andre Duncan said that with the poor lung function tests Andre Duncan still thought the risk fo transplant outweighed the benefit.     To do:  1. Continue ibrutinib at current dose  2.  Return to see me in 4 weeks  3. Repeat MRI in 2 months  4. Referral for outpatient pt/ot.    Hyperglycemia - Andre Duncan had a HGba1c in October taht was 5.5 but his blood sugars may have worsened because of the prolonged steroids. When Andre Duncan was in the ED they gave him a Rx for metformin, which Andre Duncan hasn't started yet.  His sugar is alittle better today. I recommend that Andre Duncan follow up with his PCP to discuss management.    Pneumonia - complete levo course    Today I personally reviewed CBC and CMP.  I communicated with Dr. Sheryn Bison.    Orders Placed This Encounter   Procedures   ??? CBC w/ Differential   ??? Comprehensive Metabolic Panel  The patient had complicated medical decision making due to a life threatening disease of lymphoma and chemotherapy medicines that require close monitoring for toxicity and side effects.      Andre Critchley, MD  Associate Professor  Division of Hematology    ============  Interval Hx:  Andre Duncan has had a severe cough and sometimes Andre Duncan coughs so hard that Andre Duncan has momentary syncope. Andre Duncan went to the ED and CT scan showed a possible pneumonia.  They started him on levo and his cough is improving.  No SOB.     Andre Duncan is falling asleep easily, even when just sitting in a chair for the past few weeks, but Andre Duncan was coughing so much Andre Duncan wasn't sleeping well.     No seizure activity since November. Andre Duncan feels unsteady with his gait still but no falls.     Andre Duncan is totally off of the steroids now.  Andre Duncan saw neurology and they didi a MOCA test and had some impaired cognition.  Andre Duncan has done a moca test in the past and it was normal.     Today, 11/07/2021, I independently reviewed the MRI scan of his brain from yesterday and it has improved significantly .    Answers for HPI/ROS submitted by the patient on 11/05/2021  altered mental status: Yes  clumsiness: No  focal sensory loss: No  focal weakness: Yes  loss of balance: Yes  memory loss: Yes  near-syncope: Yes  slurred speech: No  syncope: Yes  visual change: Yes  weakness: Yes  Chronicity: recurrent  Onset: 1 to 4 weeks ago  Onset quality: gradually  Progression since onset: gradually worsening  Focality: left-sided, lower extremity  abdominal pain: No  auditory change: No  aura: No  back pain: No  bladder incontinence: No  bowel incontinence: No  chest pain: No  confusion: Yes  diaphoresis: No  dizziness: No  fatigue: Yes  fever: Yes  headaches: No  light-headedness: No  nausea: No  neck pain: No  palpitations: No  shortness of breath: Yes  vertigo: No  vomiting: No  Treatments tried: medication  Improvement on treatment: significant        MEDICATIONS:  Current Outpatient Medications   Medication Sig Dispense Refill   ??? albuterol HFA 90 mcg/actuation inhaler Inhale 2 puffs every six (6) hours as needed for wheezing.      ??? cetirizine (ZYRTEC) 10 MG tablet Take 10 mg by mouth daily as needed.     ??? cholecalciferol, vitamin D3-50 mcg, 2,000 unit,, 50 mcg (2,000 unit) tablet Take 2,000 Units by mouth daily.     ??? escitalopram oxalate (LEXAPRO) 20 MG tablet Take 1 tablet (20 mg total) by mouth daily. 90 tablet 2   ??? finasteride (PROSCAR) 5 mg tablet Take 5 mg by mouth daily.     ??? fish oil/borage/flax/om3,6,9 1 (OMEGA 3-6-9 COMPLEX ORAL) Take 1 capsule by mouth daily.     ??? fluoride, sodium, (SF 5000 PLUS) 1.1 % Crea USE  SMALL AMOUNT TWICE DAILY 51 g 1   ??? ibrutinib (IMBRUVICA) 560 mg tablet Take 1 tablet (560 mg total) by mouth daily. 28 tablet 11   ??? levETIRAcetam (KEPPRA) 500 MG tablet Take 1 tablet (500 mg total) by mouth Two (2) times a day. 60 tablet 4   ??? levoFLOXacin (LEVAQUIN) 750 MG tablet Take 1 tablet (750 mg total) by mouth daily for 7 days. 7 tablet 0   ??? memantine (NAMENDA) 5 MG tablet 5 mg/day for 1  week. Then, 5 mg twice a dail for 1 week. Then  10mg  in morning with 5mg  at night, for one week.  Then 10mg  twice a day and then stay at 10mg  twice a day 90 tablet 1   ??? metFORMIN (GLUCOPHAGE) 500 MG tablet Take 1 tablet (500 mg total) by mouth in the morning and 1 tablet (500 mg total) in the evening. Take with meals. 60 tablet 0   ??? mirtazapine (REMERON) 30 MG tablet Take 1 tablet (30 mg total) by mouth nightly. 90 tablet 1   ??? multivitamin (TAB-A-VITE/THERAGRAN) per tablet Take 1 tablet by mouth daily.     ??? OLANZapine (ZYPREXA) 5 MG tablet Take 1 tablet (5 mg total) by mouth nightly. 90 tablet 1     No current facility-administered medications for this visit.         REVIEW OF SYSTEMS:  See HPI. A 10 system ROS is otherwise negative.    VITAL SIGNS:   Vitals:    11/07/21 0918   BP: 132/97   Pulse: 97   Resp: 17   Temp: 37.3 ??C (99.1 ??F)   TempSrc: Temporal   SpO2: 98%   Weight: 88.6 kg (195 lb 5.2 oz)       EXAM:  Gen: Andre Duncan can answer questions appropriately, but Andre Duncan falls asleep during the visit several times.  LYMPH: No cervical, supraclavicular, axillary, or inguinal LAD  RESP: Clear to auscultation bilaterally.  CV: Regular rate and rhythm. No rubs, gallops or murmurs.   GI: Soft, nontender, nondistended. No hepatosplenomegaly.   MSK: No edema.  NEURO: Strenght is 5/5 in BLE and in RUE.   Andre Duncan is alert and oriented x 3.   His Cranial nerves are intact (hearing and vision weren't checked).   EOMI. Andre Duncan walks unssisted, but gate is a bit more unsteady  Skin/derm: No rash      LABORATORY:  Lab on 11/07/2021 Component Date Value Ref Range Status   ??? Sodium 11/07/2021 137  135 - 145 mmol/L Final   ??? Potassium 11/07/2021 4.6  3.4 - 4.8 mmol/L Final   ??? Chloride 11/07/2021 104  98 - 107 mmol/L Final   ??? CO2 11/07/2021 22.0  20.0 - 31.0 mmol/L Final   ??? Anion Gap 11/07/2021 11  5 - 14 mmol/L Final   ??? BUN 11/07/2021 16  9 - 23 mg/dL Final   ??? Creatinine 11/07/2021 0.70  0.60 - 1.10 mg/dL Final   ??? BUN/Creatinine Ratio 11/07/2021 23   Final   ??? eGFR CKD-EPI (2021) Male 11/07/2021 >90  >=60 mL/min/1.87m2 Final    eGFR calculated with CKD-EPI 2021 equation in accordance with [employer] and [employer] of Nephrology Task Force recommendations.   ??? Glucose 11/07/2021 207 (H)  70 - 179 mg/dL Final   ??? Calcium 16/60/6301 9.1  8.7 - 10.4 mg/dL Final   ??? Albumin 60/07/9322 3.9  3.4 - 5.0 g/dL Final   ??? Total Protein 11/07/2021 7.0  5.7 - 8.2 g/dL Final   ??? Total Bilirubin 11/07/2021 0.9  0.3 - 1.2 mg/dL Final   ??? AST 55/73/2202 46 (H)  <=34 U/L Final   ??? ALT 11/07/2021 151 (H)  10 - 49 U/L Final   ??? Alkaline Phosphatase 11/07/2021 47  46 - 116 U/L Final   ??? WBC 11/07/2021 8.5  3.6 - 11.2 10*9/L Final   ??? RBC 11/07/2021 4.63  4.26 - 5.60 10*12/L Final   ??? HGB 11/07/2021 16.4  12.9 - 16.5  g/dL Final   ??? HCT 09/81/1914 46.3  39.0 - 48.0 % Final   ??? MCV 11/07/2021 99.9 (H)  77.6 - 95.7 fL Final   ??? MCH 11/07/2021 35.5 (H)  25.9 - 32.4 pg Final   ??? MCHC 11/07/2021 35.5  32.0 - 36.0 g/dL Final   ??? RDW 78/29/5621 15.9 (H)  12.2 - 15.2 % Final   ??? MPV 11/07/2021 8.1  6.8 - 10.7 fL Final   ??? Platelet 11/07/2021 184  150 - 450 10*9/L Final   ??? Neutrophils % 11/07/2021 58.4  % Final   ??? Lymphocytes % 11/07/2021 32.4  % Final   ??? Monocytes % 11/07/2021 8.7  % Final   ??? Eosinophils % 11/07/2021 0.1  % Final   ??? Basophils % 11/07/2021 0.4  % Final   ??? Absolute Neutrophils 11/07/2021 5.0  1.8 - 7.8 10*9/L Final   ??? Absolute Lymphocytes 11/07/2021 2.8  1.1 - 3.6 10*9/L Final   ??? Absolute Monocytes 11/07/2021 0.7  0.3 - 0.8 10*9/L Final   ??? Absolute Eosinophils 11/07/2021 0.0  0.0 - 0.5 10*9/L Final   ??? Absolute Basophils 11/07/2021 0.0  0.0 - 0.1 10*9/L Final

## 2021-11-07 NOTE — Unmapped (Signed)
Peripheral stick done by Gillie Manners, 23g L H, labs drawn and sent.

## 2021-11-14 ENCOUNTER — Ambulatory Visit: Admit: 2021-11-14 | Discharge: 2021-11-14 | Payer: PRIVATE HEALTH INSURANCE

## 2021-11-14 DIAGNOSIS — E1165 Type 2 diabetes mellitus with hyperglycemia: Principal | ICD-10-CM

## 2021-11-16 NOTE — Unmapped (Signed)
Assessment and Plan:     Andre Duncan was seen today for follow-up.    Diagnoses and all orders for this visit:    Cough, unspecified type    Pt was seen in the ED in Dec and treated for pneumonia with Doxycycline and Augmentin   He was then seen in the ED 11/02/2021 and CT of chest showed mild pneumonia and he was treated with Levaquin   Spouse is concerned because pt still has a cough   She reports that the cough has improved and that sputum is no longer dark brown but more light brown in color  Pt reports that he only becomes [facility] with severe coughing episodes - Which per pt are infrequent   Pt spouse reports that he has DOE with walking but has started PT to work on improving this   Pt has been referred to pulmonology for further eval of abn noted on CT of chest   Lungs are clear to auscultation in all lung fields bilat   Minimal cough is noted while in office today   Denies fever, abd/back pain, N/V, dizziness, syncope, any URI symptoms   I see no reason at this time to prescribe more antibiotics  Pt is offered reassurance and encouraged to keep follow up with pulmonology as he did not follow though with last appt     Type 2 diabetes mellitus with hyperglycemia, without long-term current use of insulin (CMS-HCC)    Pt was started on Metformin at ED visit 11/02/2021  Spouse reports that they did not start the metformin because they felt the blood sugar was only elevated from recent prednisone dosing  Today I have checked A1c and it is 9.0  Spouse has medical training and understands this significance  We discussed dx of diabetes   I discussed with them proper diet and exercise and dosing with metformin   They agree to start the Metformin  500mg  BID and follow up in 3 months     -     POCT glycosylated hemoglobin (Hb A1C)    I personally spent 20 minutes face-to-face and non-face-to-face in the care of this patient, which includes all pre, intra, and post visit time on the date of service.  All documented time was specific to the E/M visit and does not include any procedures that may have been performed.     Return in 3 months (on 02/11/2022) for Recheck DM.    Subjective:     HPI: Andre Duncan is a 56 y.o. male here for   Chief Complaint   Patient presents with   ??? Follow-up     Recheck pneumonia.  Still has cough, SOB, wheezing, and congestion.  His wife feels he is starting to get worse.  Afebrile.  Sputum is thick and tan.  Finished abx last Friday.  F/U with  Oncology 12/09/2021, Neurology in March, and Pulmonology in April.   :    Andre Duncan was seen today for follow-up.    Diagnoses and all orders for this visit:    Cough, unspecified type    Pt was seen in the ED in Dec and treated for pneumonia with Doxycycline and Augmentin   He was then seen in the ED 11/02/2021 and CT of chest showed mild pneumonia and he was treated with Levaquin   Spouse is concerned because pt still has a cough   She reports that the cough has improved and that sputum is no longer dark  brown but more light brown in color  Pt reports that he only becomes [name] with severe coughing episodes - Which per pt are infrequent   Pt spouse reports that he has DOE with walking but has started PT to work on improving this   Pt has been referred to pulmonology for further eval of abn noted on CT of chest   Lungs are clear to auscultation in all lung fields bilat   Minimal cough is noted while in office today   I see no reason at this time to prescribe more antibiotics  Pt is offered reassurance and encouraged to keep follow up with pulmonology as he did not follow though with last appt     Type 2 diabetes mellitus with hyperglycemia, without long-term current use of insulin (CMS-HCC)    Pt was started on Metformin at ED visit 11/02/2021  Spouse reports that they did not start the metformin because they felt the blood sugar was only elevated from recent prednisone dosing  Today I have checked A1c and it is 9.0  Spouse has medical training and understands this significance  We discussed dx of diabetes   I discussed with them proper diet and exercise and dosing with metformin   They agree to start the Metformin  500mg  BID and follow up in 3 months     -     POCT glycosylated hemoglobin (Hb A1C)        ROS:   Review of Systems   Constitutional: Negative for activity change, appetite change, fatigue and unexpected weight change.   Respiratory: Positive for cough and shortness of breath. Negative for chest tightness.         DOE   Cardiovascular: Negative for chest pain and palpitations.   Gastrointestinal: Negative for abdominal pain, constipation, diarrhea, nausea and vomiting.   Musculoskeletal: Negative for back pain.   Skin: Negative for rash.   Neurological: Negative for dizziness, light-headedness and headaches.        Cognitive impairment    Psychiatric/Behavioral: Negative for sleep disturbance. The patient is not nervous/anxious.         Review of systems negative unless otherwise noted as per HPI    Objective:     Visit Vitals  BP 142/84   Pulse 91   Temp 36.8 ??C (98.3 ??F) (Oral)   Ht 188 cm (6' 2)   Wt 88 kg (194 lb)   SpO2 96%   BMI 24.91 kg/m??          11/14/21 1518   PainSc: 4         Physical Exam  Constitutional:       General: He is not in acute distress.     Appearance: Normal appearance. He is well-developed and well-groomed. He is not ill-appearing.   HENT:      Head: Normocephalic and atraumatic.      Mouth/Throat:      Lips: Pink.      Mouth: Mucous membranes are moist.   Eyes:      Conjunctiva/sclera: Conjunctivae normal.   Cardiovascular:      Rate and Rhythm: Normal rate and regular rhythm.      Heart sounds: Normal heart sounds, S1 normal and S2 normal.   Pulmonary:      Effort: Pulmonary effort is normal.      Breath sounds: Normal breath sounds and air entry.   Abdominal:      General: Bowel sounds are normal.  Palpations: Abdomen is soft.      Tenderness: There is no abdominal tenderness.   Skin:     General: Skin is warm and dry.   Neurological:      Mental Status: He is alert and oriented to person, place, and time.   Psychiatric:         Attention and Perception: Attention and perception normal.         Mood and Affect: Mood and affect normal.         Speech: Speech normal.         Behavior: Behavior normal. Behavior is cooperative.         Thought Content: Thought content normal.          [facility]:     Medication adherence and barriers to the treatment plan have been addressed. Opportunities to optimize healthy behaviors have been discussed. Patient / caregiver voiced understanding.

## 2021-11-25 DIAGNOSIS — C8589 Other specified types of non-Hodgkin lymphoma, extranodal and solid organ sites: Principal | ICD-10-CM

## 2021-11-26 NOTE — Unmapped (Signed)
The [facility] has made a second and final attempt to reach this patient to refill the following medication:Imbruvica.      We have left voicemails on the following phone numbers: [phone], have been unable to leave messages on the following phone numbers: [phone] and have sent a MyChart message.    Dates contacted: 2/10,21  Last scheduled delivery: 10/29/21    The patient may be at risk of non-compliance with this medication. The patient should call the [facility] at [phone]  Option 4, then Option 1 (oncology) to refill medication.    Wyatt Mage Hulda Humphrey   [facility] Specialty Technician

## 2021-11-28 NOTE — Unmapped (Signed)
[facility] Refill Coordination Note    Specialty Medication(s) to be Shipped:   Hematology/Oncology: Imbruvica 560mg   Other medication(s) to be shipped: No additional medications requested for fill at this time     Andre Duncan, DOB: June 27, 1966  Phone: [phone] (home)     All above HIPAA information was verified with patient's family member, Wife, [name].     Was a Nurse, learning disability used for this call? No    Completed refill call assessment today to schedule patient's medication shipment from the [facility] [phone]).  All relevant notes have been reviewed.     Specialty medication(s) and dose(s) confirmed: Regimen is correct and unchanged.   Changes to medications: [place] reports starting the following medications: Metformin 500mg  ( 1 tab twice a day )  Changes to insurance: No  New side effects reported not previously addressed with a pharmacist or physician: None reported  Questions for the pharmacist: No    Confirmed patient received a Conservation officer, historic buildings and a Surveyor, mining with first shipment. The patient will receive a drug information handout for each medication shipped and additional FDA Medication Guides as required.       DISEASE/MEDICATION-SPECIFIC INFORMATION        N/A    SPECIALTY MEDICATION ADHERENCE     Medication Adherence    Patient reported X missed doses in the last month: 0  Specialty Medication: Imbruvica 500mg   Patient is on additional specialty medications: No  Patient is on more than two specialty medications: No  Informant: spouse  Reliability of informant: reliable  Reasons for non-adherence: no problems identified  Confirmed plan for next specialty medication refill: delivery by pharmacy  Refills needed for supportive medications: not needed        Were doses missed due to medication being on hold? No    Imbruvica 560mg : 5 days of medicine on hand     REFERRAL TO PHARMACIST     Referral to the pharmacist: Not needed    [facility] Shipping address confirmed in Epic.     Delivery Scheduled: Yes, Expected medication delivery date: 11/29/2021.     Medication will be delivered via Same Day Courier to the prescription address in Epic WAM.    Clemente Dewey P Allena Katz   [facility] Specialty Technician

## 2021-11-29 MED FILL — IMBRUVICA 560 MG TABLET: ORAL | 28 days supply | Qty: 28 | Fill #3

## 2021-11-29 NOTE — Unmapped (Signed)
Pt Wife following up on metformin prescribed by   ER they instructed him to take 2 tabs twice daily so 1000mg  BID   Currently out of medication and would like it sent to    [facility] in [place]   Last A1C was 9.0   Please advise

## 2021-12-01 MED ORDER — METFORMIN 500 MG TABLET
ORAL_TABLET | Freq: Two times a day (BID) | ORAL | 3 refills | 45 days | Status: CP
Start: 2021-12-01 — End: 2022-12-01

## 2021-12-04 ENCOUNTER — Ambulatory Visit: Admit: 2021-12-04 | Payer: PRIVATE HEALTH INSURANCE

## 2021-12-05 ENCOUNTER — Ambulatory Visit: Admit: 2021-12-05 | Discharge: 2021-12-05 | Payer: PRIVATE HEALTH INSURANCE

## 2021-12-05 ENCOUNTER — Other Ambulatory Visit: Admit: 2021-12-05 | Discharge: 2021-12-05 | Payer: PRIVATE HEALTH INSURANCE

## 2021-12-05 DIAGNOSIS — C8330 Diffuse large B-cell lymphoma, unspecified site: Principal | ICD-10-CM

## 2021-12-05 LAB — COMPREHENSIVE METABOLIC PANEL
ALBUMIN: 3.7 g/dL (ref 3.4–5.0)
ALKALINE PHOSPHATASE: 47 U/L (ref 46–116)
ALT (SGPT): 45 U/L (ref 10–49)
ANION GAP: 9 mmol/L (ref 5–14)
AST (SGOT): 25 U/L (ref <=34)
BILIRUBIN TOTAL: 0.8 mg/dL (ref 0.3–1.2)
BLOOD UREA NITROGEN: 12 mg/dL (ref 9–23)
BUN / CREAT RATIO: 18
CALCIUM: 9.2 mg/dL (ref 8.7–10.4)
CHLORIDE: 110 mmol/L — ABNORMAL HIGH (ref 98–107)
CO2: 23 mmol/L (ref 20.0–31.0)
CREATININE: 0.68 mg/dL
EGFR CKD-EPI (2021) MALE: >90 mL/min/{1.73_m2} (ref >=60)
GLUCOSE RANDOM: 149 mg/dL (ref 70–179)
POTASSIUM: 4.4 mmol/L (ref 3.4–4.8)
PROTEIN TOTAL: 6.8 g/dL (ref 5.7–8.2)
SODIUM: 142 mmol/L (ref 135–145)

## 2021-12-05 LAB — CBC W/ AUTO DIFF
BASOPHILS ABSOLUTE COUNT: 0 10*9/L (ref 0.0–0.1)
BASOPHILS RELATIVE PERCENT: 0.3 %
EOSINOPHILS ABSOLUTE COUNT: 0.1 10*9/L (ref 0.0–0.5)
EOSINOPHILS RELATIVE PERCENT: 0.6 %
HEMATOCRIT: 41.7 % (ref 39.0–48.0)
HEMOGLOBIN: 15 g/dL (ref 12.9–16.5)
LYMPHOCYTES ABSOLUTE COUNT: 4 10*9/L — ABNORMAL HIGH (ref 1.1–3.6)
LYMPHOCYTES RELATIVE PERCENT: 33.6 %
MEAN CORPUSCULAR HEMOGLOBIN CONC: 36 g/dL (ref 32.0–36.0)
MEAN CORPUSCULAR HEMOGLOBIN: 35.4 pg — ABNORMAL HIGH (ref 25.9–32.4)
MEAN CORPUSCULAR VOLUME: 98.4 fL — ABNORMAL HIGH (ref 77.6–95.7)
MEAN PLATELET VOLUME: 8.3 fL (ref 6.8–10.7)
MONOCYTES ABSOLUTE COUNT: 0.9 10*9/L — ABNORMAL HIGH (ref 0.3–0.8)
MONOCYTES RELATIVE PERCENT: 7.5 %
NEUTROPHILS ABSOLUTE COUNT: 6.8 10*9/L (ref 1.8–7.8)
NEUTROPHILS RELATIVE PERCENT: 58 %
PLATELET COUNT: 272 10*9/L (ref 150–450)
RED BLOOD CELL COUNT: 4.24 10*12/L — ABNORMAL LOW (ref 4.26–5.60)
RED CELL DISTRIBUTION WIDTH: 14.3 % (ref 12.2–15.2)
WBC ADJUSTED: 11.8 10*9/L — ABNORMAL HIGH (ref 3.6–11.2)

## 2021-12-05 MED ORDER — PANTOPRAZOLE 40 MG TABLET,DELAYED RELEASE
ORAL_TABLET | Freq: Every day | ORAL | 1 refills | 30 days | Status: CP
Start: 2021-12-05 — End: 2022-12-05

## 2021-12-05 NOTE — Unmapped (Signed)
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  Regimen: MATRIX (Methotrexate, cytarabine, thiotepa, rituximab): started 06/07/20-08/10/20 for 4 cycles --> pr-->single agent methotrexate x 3 cycles --> further decrease in size of masses   Not a transplant candidate due to poor PFTs and active smoking   Whole brain radiation 2340cGy given at 180cGy x 13 from 01/17/21-02/04/21 --> CR  Relapse - progression seen on routine MRI scan in new sites 08/15/2021 , seizure like activity noted 12/1 and keppra started  Regimen #2:  5 fractions of Cyberknife to involved areas (2500cGy) completed 09/06/21  Ibrutinib started 09/06/21  MRI brain 11/06/21 - significantly improved      CNS lymphoma:  He received cyberknife as a palliative measure to decrease the known masses as quickly as possible. This is largely a palliative measure, particularly since it is likely that he will develop other lesions in the brain without alternative therapy. Therefore, he was started on treatment with ibrutinib.  There is data for ibrutinib, a first generation BTKi (Soussain et al. Eur J Conacer 2019).  A phase II study of 44 evaluable patients with rel/ref primary CNS DLBCL demonstrated that after 2 months of treatment the disease control rate was 70% with a CR in 19%, PR in 33% and stable disease in 10%.  After a median follow up of 25.7 months, the median BFS was 48 months and left eye was 19.2 months.  13/44 patients received ibrutinib for over 12 months.  Ibrutinib was detectable in the CSF fluid.  Two patients developed pulmonary aspergillosis. NCCN recommendations include ibrutinib.     He has previously met with transplant. With the poor lung function tests Dr. Sheryn Bison still thought the risk fo transplant outweighed the benefit.   The patient has an upcoming appt with pulmonology.     Today's Summary:   No new CNS symptoms or focal deficits.   Pulmonary issues have improved.   Remaining memory issues; naps a lot.   No recent seizures. Remains on memantine.   Overall- his wife things his cognition has gotten worse over time.       Wt Readings from Last 6 Encounters:   12/05/21 86.2 kg (190 lb 0.6 oz)   11/14/21 88 kg (194 lb)   11/07/21 88.6 kg (195 lb 5.2 oz)   11/06/21 86.1 kg (189 lb 12.8 oz)   11/02/21 86.2 kg (190 lb)   10/22/21 87.1 kg (192 lb)         To do:  1. Continue ibrutinib at current dose  2.  Return  in 4 weeks (coordinate on day with brain MRI and visit with Dr. Baird Lyons)      Hyperglycemia - He had a HGba1c in October taht was 5.5 but his blood sugars may have worsened because of the prolonged steroids. PCP recently increased metformin dose to 1000 mg BID    ============  Interval Hx:    He is interested in getting out in the garden. Denies new weakness or focal deficits.  Generalized fatigue; completes ADLs but still dozes off a lot.   Still issues with recall and short-term memory.  Appetite is low; denies nausea or vomiting.   Restarted PPI to see if this would help with his appetite. OTC prilosec.   Denies fevers, chills, sweats, chest pain, cough or shortness of breath  No extremity edema.   Vision is still altered; just recently got new glasses but thinks that his vision changed again.   Denies headaches.  He receiving  PT/OT  He is working some at the tobacco shop    MEDICATIONS:  Current Outpatient Medications   Medication Sig Dispense Refill   ??? albuterol HFA 90 mcg/actuation inhaler Inhale 2 puffs every six (6) hours as needed for wheezing.      ??? cetirizine (ZYRTEC) 10 MG tablet Take 10 mg by mouth daily as needed.     ??? cholecalciferol, vitamin D3-50 mcg, 2,000 unit,, 50 mcg (2,000 unit) tablet Take 2,000 Units by mouth daily.     ??? escitalopram oxalate (LEXAPRO) 20 MG tablet Take 1 tablet (20 mg total) by mouth daily. 90 tablet 2   ??? finasteride (PROSCAR) 5 mg tablet Take 5 mg by mouth daily.     ??? fish oil/borage/flax/om3,6,9 1 (OMEGA 3-6-9 COMPLEX ORAL) Take 1 capsule by mouth daily.     ??? fluoride, sodium, (SF 5000 PLUS) 1.1 % Crea USE  SMALL AMOUNT TWICE DAILY 51 g 1   ??? ibrutinib (IMBRUVICA) 560 mg tablet Take 1 tablet (560 mg total) by mouth daily. 28 tablet 11   ??? memantine (NAMENDA) 5 MG tablet 5 mg/day for 1 week. Then, 5 mg twice a dail for 1 week. Then  10mg  in morning with 5mg  at night, for one week.  Then 10mg  twice a day and then stay at 10mg  twice a day (Patient taking differently: Take 10 mg by mouth Two (2) times a day. 5 mg/day for 1 week. Then, 5 mg twice a dail for 1 week. Then  10mg  in morning with 5mg  at night, for one week.  Then 10mg  twice a day and then stay at 10mg  twice a day) 90 tablet 1   ??? metFORMIN (GLUCOPHAGE) 500 MG tablet Take 2 tablets (1,000 mg total) by mouth in the morning and 2 tablets (1,000 mg total) in the evening. Take with meals. 180 tablet 3   ??? mirtazapine (REMERON) 30 MG tablet Take 1 tablet (30 mg total) by mouth nightly. 90 tablet 1   ??? multivitamin (TAB-A-VITE/THERAGRAN) per tablet Take 1 tablet by mouth daily.     ??? OLANZapine (ZYPREXA) 5 MG tablet Take 1 tablet (5 mg total) by mouth nightly. 90 tablet 1   ??? levETIRAcetam (KEPPRA) 500 MG tablet Take 1 tablet (500 mg total) by mouth Two (2) times a day. 60 tablet 4   ??? pantoprazole (PROTONIX) 40 MG tablet Take 1 tablet (40 mg total) by mouth daily. 30 tablet 1     No current facility-administered medications for this visit.         VITAL SIGNS:   Vitals:    12/05/21 1019   BP: 133/93   Pulse: 78   Resp: 16   Temp: 36.6 ??C (97.9 ??F)   TempSrc: Temporal   SpO2: 98%   Weight: 86.2 kg (190 lb 0.6 oz)   Height: 188 cm (6' 2.02)       Physical Exam  Constitutional:       General: He is not in acute distress.  Eyes:      General: No scleral icterus.     Extraocular Movements: Extraocular movements intact.      Pupils: Pupils are equal, round, and reactive to light.   Cardiovascular:      Rate and Rhythm: Regular rhythm.      Heart sounds: Normal heart sounds.   Pulmonary:      Effort: Pulmonary effort is normal. Breath sounds: Normal breath sounds.   Abdominal:      Palpations: Abdomen is soft.  Tenderness: There is no abdominal tenderness.   Musculoskeletal:         General: No swelling.      Comments: Able to position to exam table independently.  Interactive during visit.    Skin:     General: Skin is warm and dry.   Neurological:      General: No focal deficit present.      Mental Status: He is alert.      Comments: 5/5 strength upper and lower extremities   Psychiatric:         Mood and Affect: Mood normal.         Behavior: Behavior normal.           LABORATORY:  Appointment on 12/05/2021   Component Date Value Ref Range Status   ??? WBC 12/05/2021 11.8 (H)  3.6 - 11.2 10*9/L Final   ??? RBC 12/05/2021 4.24 (L)  4.26 - 5.60 10*12/L Final   ??? HGB 12/05/2021 15.0  12.9 - 16.5 g/dL Final   ??? HCT 16/07/9603 41.7  39.0 - 48.0 % Final   ??? MCV 12/05/2021 98.4 (H)  77.6 - 95.7 fL Final   ??? MCH 12/05/2021 35.4 (H)  25.9 - 32.4 pg Final   ??? MCHC 12/05/2021 36.0  32.0 - 36.0 g/dL Final   ??? RDW 54/06/8118 14.3  12.2 - 15.2 % Final   ??? MPV 12/05/2021 8.3  6.8 - 10.7 fL Final   ??? Platelet 12/05/2021 272  150 - 450 10*9/L Final   ??? Neutrophils % 12/05/2021 58.0  % Final   ??? Lymphocytes % 12/05/2021 33.6  % Final   ??? Monocytes % 12/05/2021 7.5  % Final   ??? Eosinophils % 12/05/2021 0.6  % Final   ??? Basophils % 12/05/2021 0.3  % Final   ??? Absolute Neutrophils 12/05/2021 6.8  1.8 - 7.8 10*9/L Final   ??? Absolute Lymphocytes 12/05/2021 4.0 (H)  1.1 - 3.6 10*9/L Final   ??? Absolute Monocytes 12/05/2021 0.9 (H)  0.3 - 0.8 10*9/L Final   ??? Absolute Eosinophils 12/05/2021 0.1  0.0 - 0.5 10*9/L Final   ??? Absolute Basophils 12/05/2021 0.0  0.0 - 0.1 10*9/L Final

## 2021-12-06 IMAGING — DX DG CHEST 2V
2 series · 2 of 2 positions shown · non-contrast
Comparison: CT of the abdomen and pelvis 11/10/2017

CLINICAL DATA: Shortness of breath.  Cough.

EXAM:
CHEST - 2 VIEW

[chest pa]
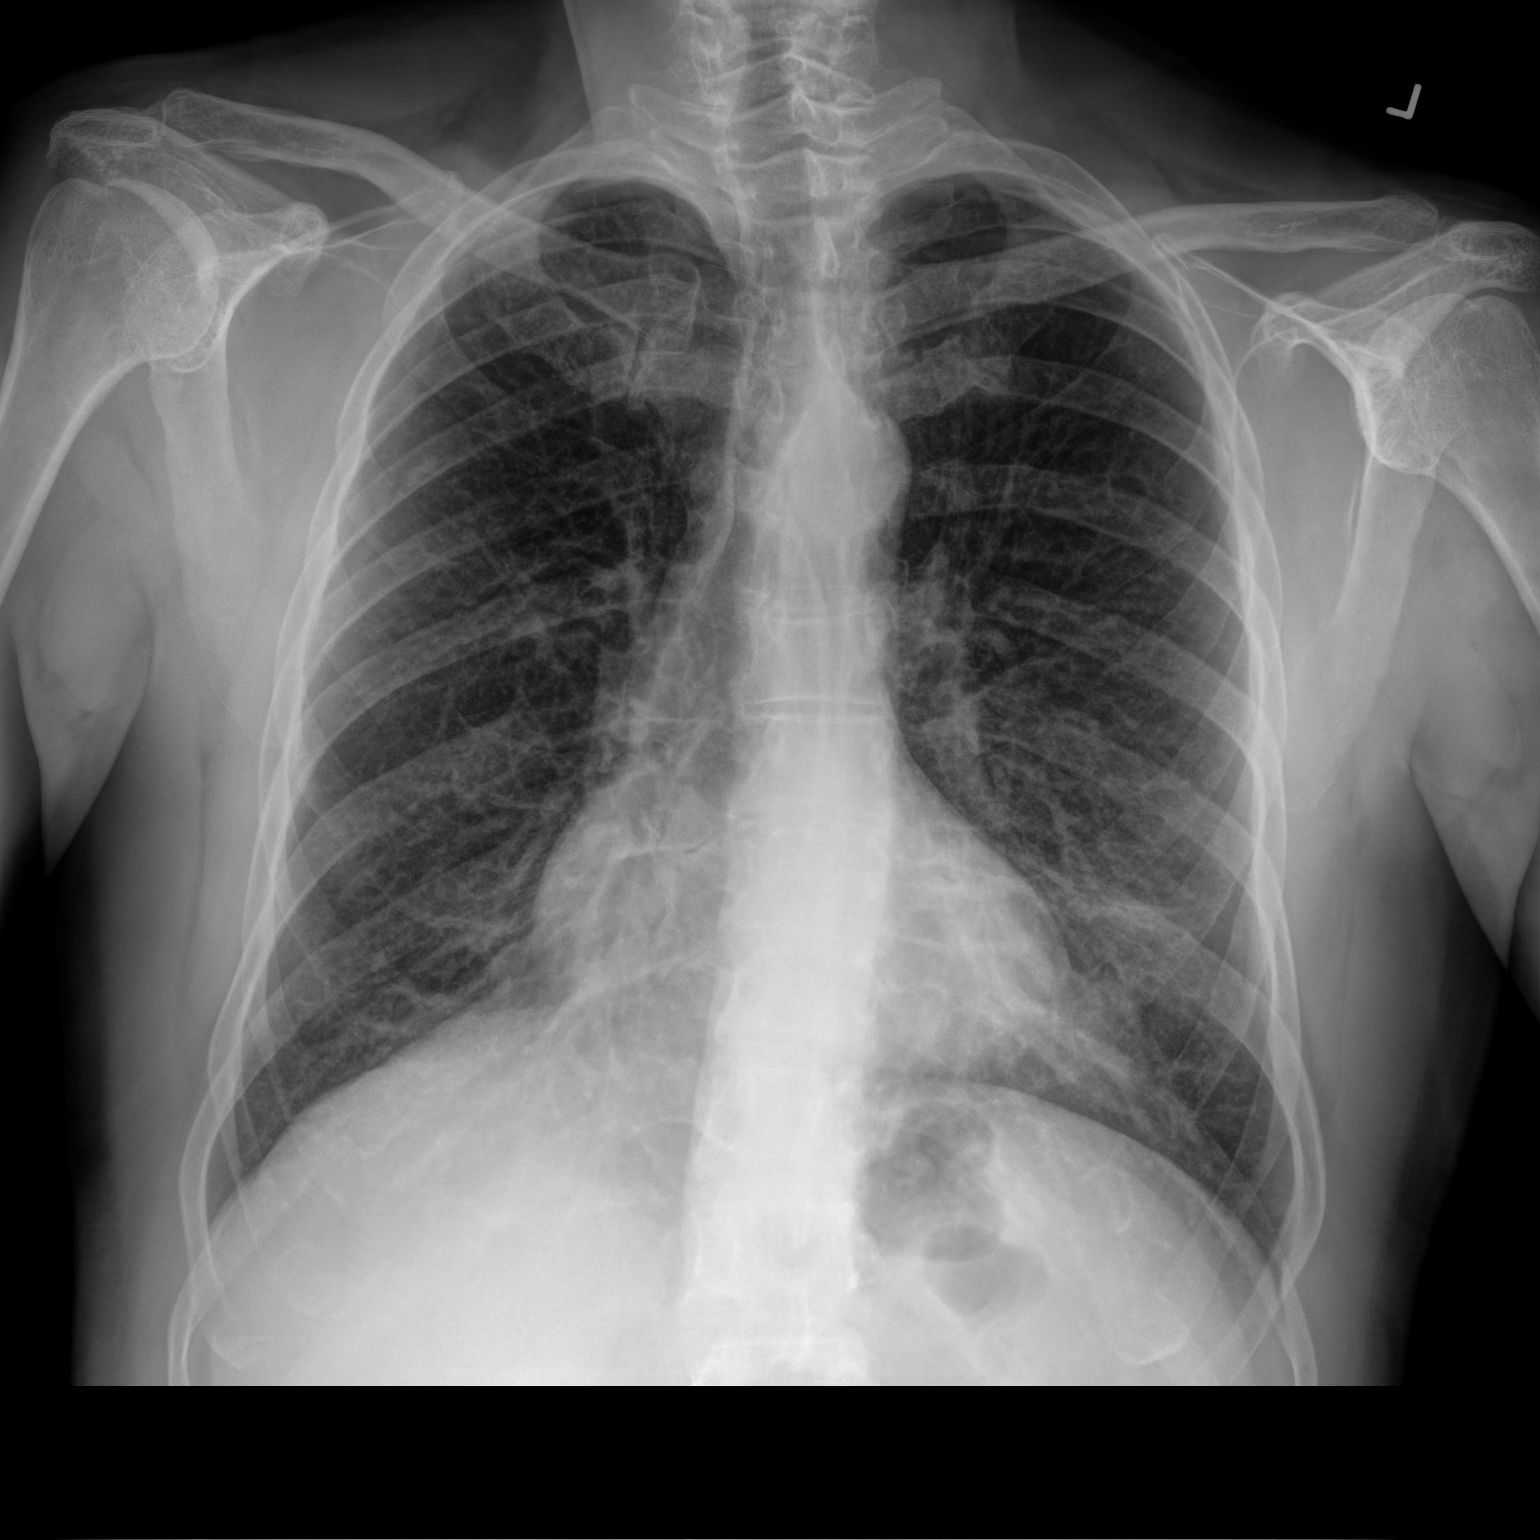

[chest lat]
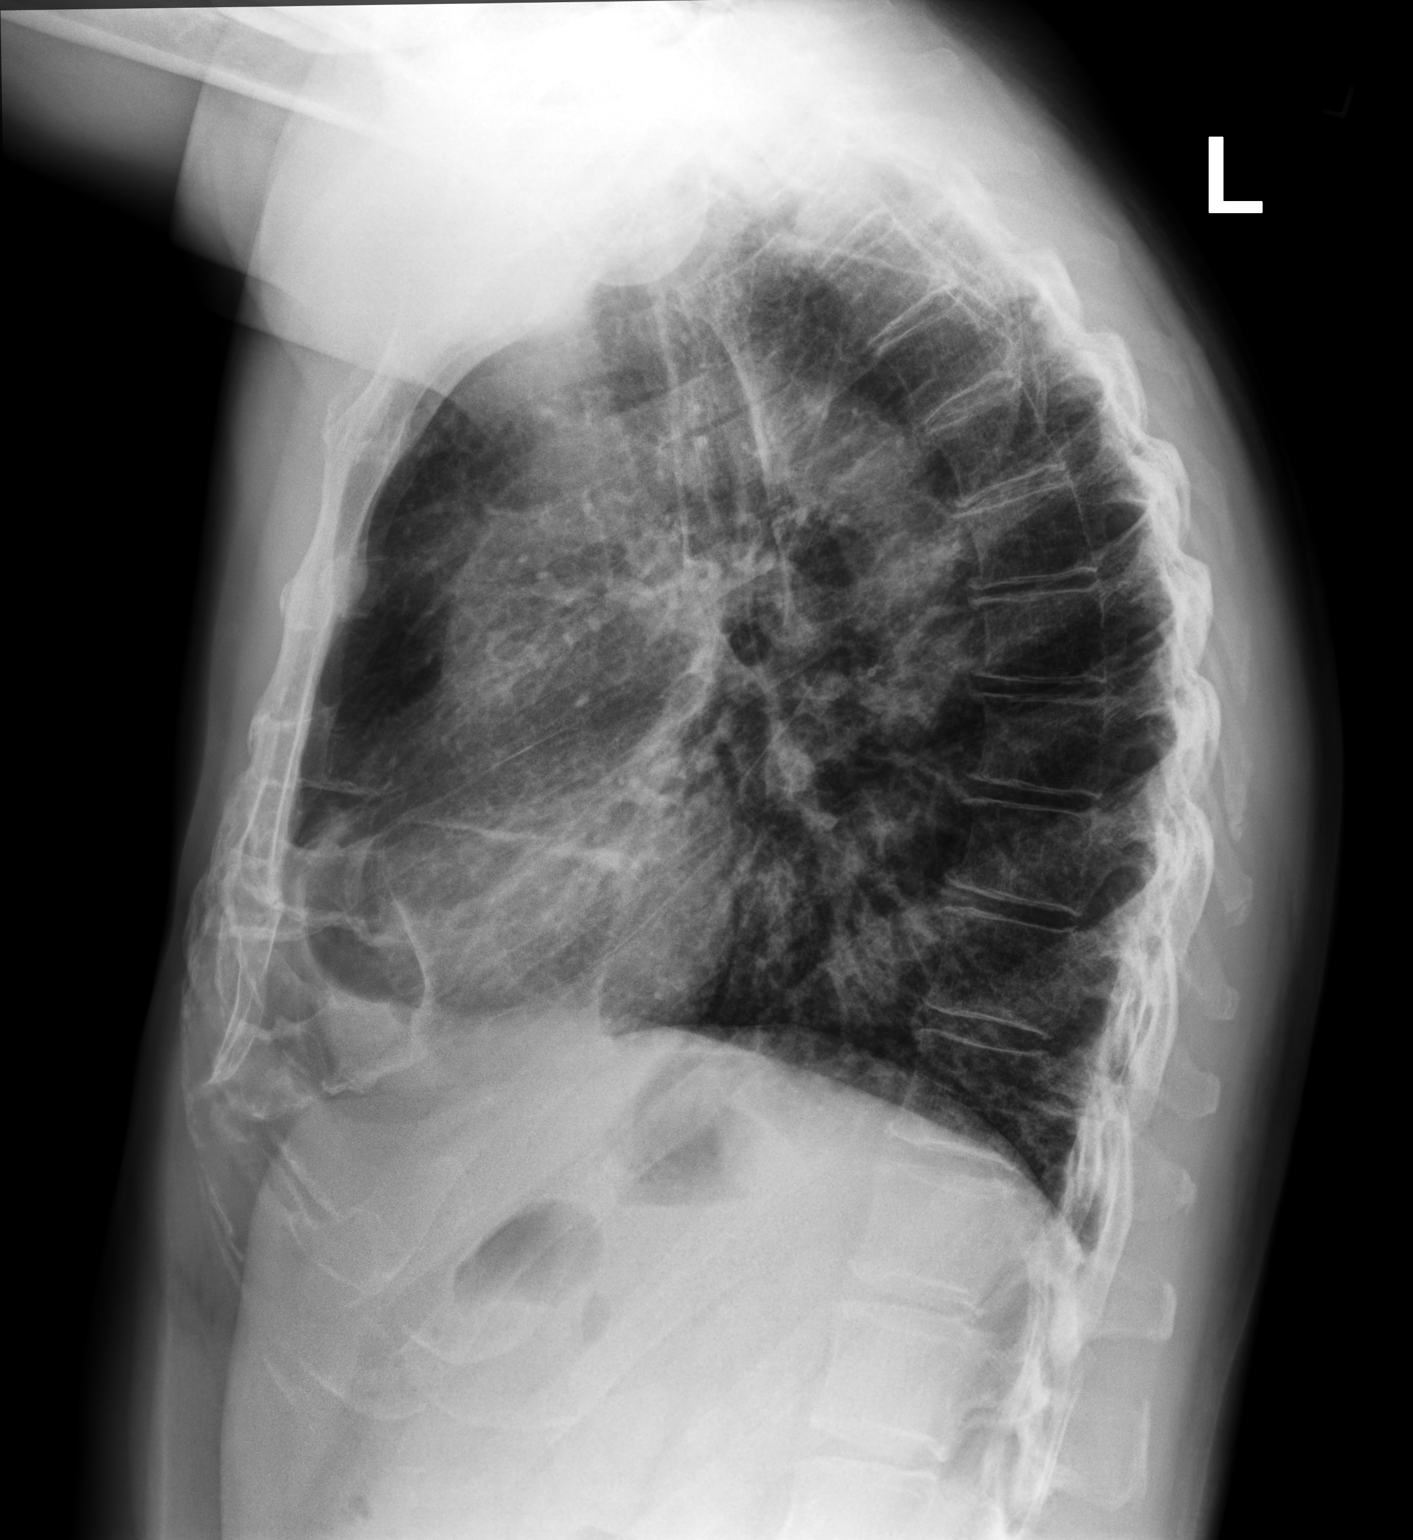

[2 of 2 positions shown; findings below may reference images not displayed]

FINDINGS: Heart size normal. Atherosclerotic changes are noted at the aortic
arch. Changes of COPD are noted. Linear atelectasis or scarring is
present in the lingula. Lungs are otherwise clear. The visualized
soft tissues and bony thorax are unremarkable.
IMPRESSION: 1. No acute cardiopulmonary disease or significant interval change.
2. COPD.
3. Aortic atherosclerosis.

## 2021-12-18 MED ORDER — MEMANTINE 5 MG TABLET
ORAL_TABLET | Freq: Two times a day (BID) | ORAL | 1 refills | 90 days
Start: 2021-12-18 — End: 2022-12-19

## 2021-12-18 NOTE — Unmapped (Signed)
Patient is requesting the following refill  Requested Prescriptions     Pending Prescriptions Disp Refills   ??? memantine (NAMENDA) 5 MG tablet 360 tablet 1     Sig: Take 2 tablets (10 mg total) by mouth Two (2) times a day.       Recent Visits  Date Type Provider Dept   11/14/21 Office Visit Amie Portland, FNP [facility]   07/17/21 Office Visit Amie Portland, FNP [facility]   04/16/21 Office Visit Amie Portland, FNP [facility]   Showing recent visits within past 365 days with a meds authorizing provider and meeting all other requirements  Future Appointments  Date Type Provider Dept   02/06/22 Appointment Amie Portland, FNP  [facility]   Showing future appointments within next 365 days with a meds authorizing provider and meeting all other requirements       Labs: Not applicable this refill

## 2021-12-18 NOTE — Unmapped (Signed)
Pt needs to reach out to [facility] for this refill - Neurology

## 2021-12-19 MED ORDER — MEMANTINE 10 MG TABLET
ORAL_TABLET | Freq: Two times a day (BID) | ORAL | 5 refills | 30 days | Status: CP
Start: 2021-12-19 — End: ?

## 2021-12-19 NOTE — Unmapped (Signed)
Received a VM from [facility] requesting medication refills for Memantine 10mg  BID. Rx pended and ready to be signed.

## 2021-12-26 NOTE — Unmapped (Signed)
[facility] Refill Coordination Note    Specialty Medication(s) to be Shipped:   Hematology/Oncology: Imbruvica 560mg      Other medication(s) to be shipped: No additional medications requested for fill at this time     Andre Duncan, DOB: 1966-06-09  Phone: [phone] (home)     All above HIPAA information was verified with patient's spouse, [name]     Was a Nurse, learning disability used for this call? No    Completed refill call assessment today to schedule patient's medication shipment from the [facility] [phone]).  All relevant notes have been reviewed.     Specialty medication(s) and dose(s) confirmed: Regimen is correct and unchanged.   Changes to medications: Kham reports no changes at this time.  Changes to insurance: No  New side effects reported not previously addressed with a pharmacist or physician: None reported  Questions for the pharmacist: No    Confirmed patient received a Conservation officer, historic buildings and a Surveyor, mining with first shipment. The patient will receive a drug information handout for each medication shipped and additional FDA Medication Guides as required.       DISEASE/MEDICATION-SPECIFIC INFORMATION        N/A    SPECIALTY MEDICATION ADHERENCE     Medication Adherence    Patient reported X missed doses in the last month: 0  Specialty Medication: IMBRUVICA 560 mg  Patient is on additional specialty medications: No  Informant: spouse        Were doses missed due to medication being on hold? No    Imbruvica 560mg : 6 days of medicine on hand     REFERRAL TO PHARMACIST     Referral to the pharmacist: Not needed    [facility]     Shipping address confirmed in Epic.     Delivery Scheduled: Yes, Expected medication delivery date: 12/27/21.     Medication will be delivered via Same Day Courier to the prescription address in Epic WAM.    Jasper Loser   [facility] Specialty Technician

## 2021-12-27 MED FILL — IMBRUVICA 560 MG TABLET: ORAL | 28 days supply | Qty: 28 | Fill #4

## 2022-01-09 ENCOUNTER — Ambulatory Visit: Admit: 2022-01-09 | Discharge: 2022-01-10 | Attending: Radiation Oncology | Primary: Radiation Oncology

## 2022-01-09 ENCOUNTER — Ambulatory Visit: Admit: 2022-01-09 | Discharge: 2022-01-09 | Payer: PRIVATE HEALTH INSURANCE

## 2022-01-09 ENCOUNTER — Other Ambulatory Visit: Admit: 2022-01-09 | Discharge: 2022-01-09 | Payer: PRIVATE HEALTH INSURANCE

## 2022-01-09 DIAGNOSIS — C8330 Diffuse large B-cell lymphoma, unspecified site: Principal | ICD-10-CM

## 2022-01-09 LAB — COMPREHENSIVE METABOLIC PANEL
ALBUMIN: 3.6 g/dL (ref 3.4–5.0)
ALKALINE PHOSPHATASE: 46 U/L (ref 46–116)
ALT (SGPT): 19 U/L (ref 10–49)
ANION GAP: 6 mmol/L (ref 5–14)
AST (SGOT): 12 U/L (ref <=34)
BILIRUBIN TOTAL: 0.7 mg/dL (ref 0.3–1.2)
BLOOD UREA NITROGEN: 9 mg/dL (ref 9–23)
BUN / CREAT RATIO: 11
CALCIUM: 8.8 mg/dL (ref 8.7–10.4)
CHLORIDE: 109 mmol/L — ABNORMAL HIGH (ref 98–107)
CO2: 27 mmol/L (ref 20.0–31.0)
CREATININE: 0.8 mg/dL
EGFR CKD-EPI (2021) MALE: >90 mL/min/{1.73_m2} (ref >=60)
GLUCOSE RANDOM: 91 mg/dL (ref 70–179)
POTASSIUM: 4 mmol/L (ref 3.4–4.8)
PROTEIN TOTAL: 6.6 g/dL (ref 5.7–8.2)
SODIUM: 142 mmol/L (ref 135–145)

## 2022-01-09 LAB — CBC W/ AUTO DIFF
BASOPHILS ABSOLUTE COUNT: 0 10*9/L (ref 0.0–0.1)
BASOPHILS RELATIVE PERCENT: 0.4 %
EOSINOPHILS ABSOLUTE COUNT: 0.1 10*9/L (ref 0.0–0.5)
EOSINOPHILS RELATIVE PERCENT: 0.8 %
HEMATOCRIT: 41.7 % (ref 39.0–48.0)
HEMOGLOBIN: 14.2 g/dL (ref 12.9–16.5)
LYMPHOCYTES ABSOLUTE COUNT: 2.9 10*9/L (ref 1.1–3.6)
LYMPHOCYTES RELATIVE PERCENT: 26.6 %
MEAN CORPUSCULAR HEMOGLOBIN CONC: 34.1 g/dL (ref 32.0–36.0)
MEAN CORPUSCULAR HEMOGLOBIN: 33.6 pg — ABNORMAL HIGH (ref 25.9–32.4)
MEAN CORPUSCULAR VOLUME: 98.6 fL — ABNORMAL HIGH (ref 77.6–95.7)
MEAN PLATELET VOLUME: 8.1 fL (ref 6.8–10.7)
MONOCYTES ABSOLUTE COUNT: 0.9 10*9/L — ABNORMAL HIGH (ref 0.3–0.8)
MONOCYTES RELATIVE PERCENT: 8.4 %
NEUTROPHILS ABSOLUTE COUNT: 6.8 10*9/L (ref 1.8–7.8)
NEUTROPHILS RELATIVE PERCENT: 63.8 %
PLATELET COUNT: 207 10*9/L (ref 150–450)
RED BLOOD CELL COUNT: 4.23 10*12/L — ABNORMAL LOW (ref 4.26–5.60)
RED CELL DISTRIBUTION WIDTH: 13.4 % (ref 12.2–15.2)
WBC ADJUSTED: 10.7 10*9/L (ref 3.6–11.2)

## 2022-01-09 MED ADMIN — gadobenate dimeglumine (MULTIHANCE) 529 mg/mL (0.1mmol/0.2mL) solution 17 mL: 17 mL | INTRAVENOUS | @ 12:00:00 | Stop: 2022-01-09

## 2022-01-09 NOTE — Unmapped (Signed)
Labs drawn off of existing PIV by RN Marcella Dubs, PIV d/c'ed.

## 2022-01-09 NOTE — Unmapped (Signed)
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  Regimen: MATRIX (Methotrexate, cytarabine, thiotepa, rituximab): started 06/07/20-08/10/20 for 4 cycles --> pr-->single agent methotrexate x 3 cycles --> further decrease in size of masses   Not a transplant candidate due to poor PFTs and active smoking   Whole brain radiation 2340cGy given at 180cGy x 13 from 01/17/21-02/04/21 --> CR  Relapse - progression seen on routine MRI scan in new sites 08/15/2021 , seizure like activity noted 12/1 and keppra started  Regimen #2:  5 fractions of Cyberknife to involved areas (2500cGy) completed 09/06/21  Ibrutinib started 09/06/21--  MRI brain 11/06/21 - significantly improved  MRI 01/09/22: No residual enhancing lesions or disease progression; stable T2/flair- may represent posttreatment changes and superimposed chronic small vessel ischemic disease.     CNS lymphoma:  Andre Duncan received cyberknife as a palliative measure to decrease the known masses as quickly as possible. This is largely a palliative measure, particularly since it is likely that Andre Duncan will develop other lesions in the brain without alternative therapy. Therefore, Andre Duncan was started on treatment with ibrutinib.  There is data for ibrutinib, a first generation BTKi (Soussain et al. Eur J Conacer 2019).  A phase II study of 44 evaluable patients with rel/ref primary CNS DLBCL demonstrated that after 2 months of treatment the disease control rate was 70% with a CR in 19%, PR in 33% and stable disease in 10%.  After a median follow up of 25.7 months, the median BFS was 48 months and left eye was 19.2 months.  13/44 patients received ibrutinib for over 12 months.  Ibrutinib was detectable in the CSF fluid.  Two patients developed pulmonary aspergillosis. NCCN recommendations include ibrutinib.     Andre Duncan has previously met with transplant. With the poor lung function tests Dr. Sheryn Bison still thought the risk fo transplant outweighed the benefit.   The patient has an upcoming appt with pulmonology.     Today's Summary:   .   MRI is stable today and symptoms are stable to improved.   No clinical findings to suggest disease progression.   Andre Duncan is tolerating ibrutinib without significant AEs of treatment.     Wt Readings from Last 6 Encounters:   01/09/22 88.2 kg (194 lb 8 oz)   12/05/21 86.2 kg (190 lb 0.6 oz)   11/14/21 88 kg (194 lb)   11/07/21 88.6 kg (195 lb 5.2 oz)   11/06/21 86.1 kg (189 lb 12.8 oz)   11/02/21 86.2 kg (190 lb)         To do:  1. Continue ibrutinib at current dose  2.  Andre Duncan has been set up for repeat bran MRI and clinic visit with Dr. Baird Lyons on 03/13/22.  We will coordinate our visit on this date as well.       Hyperglycemia - Andre Duncan had a HGba1c in October taht was 5.5 but his blood sugars may have worsened because of the prolonged steroids. PCP recently increased metformin dose to 1000 mg BID- this is stable/improved.    ============  Interval Hx:    Andre Duncan is overall doing well.  Energy level/stamina improved.   Still short term memory issues but no progressive or worsened symptoms.     Denies fevers, chills, sweats, chest pain, cough or shortness of breath.  Denies headaches or focal deficits.  No falls.     No weight loss, abdominal pain, nausea or vomiting.     MEDICATIONS:  Current Outpatient Medications   Medication Sig  Dispense Refill   ??? albuterol HFA 90 mcg/actuation inhaler Inhale 2 puffs every six (6) hours as needed for wheezing.     ??? cetirizine (ZYRTEC) 10 MG tablet Take 1 tablet (10 mg total) by mouth daily as needed.     ??? cholecalciferol, vitamin D3-50 mcg, 2,000 unit,, 50 mcg (2,000 unit) tablet Take 1 tablet (50 mcg total) by mouth daily.     ??? escitalopram oxalate (LEXAPRO) 20 MG tablet Take 1 tablet (20 mg total) by mouth daily. 90 tablet 2   ??? finasteride (PROSCAR) 5 mg tablet Take 1 tablet (5 mg total) by mouth daily.     ??? fish oil/borage/flax/om3,6,9 1 (OMEGA 3-6-9 COMPLEX ORAL) Take 1 capsule by mouth daily.     ??? fluoride, sodium, (SF 5000 PLUS) 1.1 % Crea USE  SMALL AMOUNT TWICE DAILY 51 g 1   ??? ibrutinib (IMBRUVICA) 560 mg tablet Take 1 tablet (560 mg total) by mouth daily. 28 tablet 11   ??? memantine (NAMENDA) 10 MG tablet Take 1 tablet (10 mg total) by mouth Two (2) times a day. 60 tablet 5   ??? metFORMIN (GLUCOPHAGE) 500 MG tablet Take 2 tablets (1,000 mg total) by mouth in the morning and 2 tablets (1,000 mg total) in the evening. Take with meals. 180 tablet 3   ??? mirtazapine (REMERON) 30 MG tablet Take 1 tablet (30 mg total) by mouth nightly. 90 tablet 1   ??? multivitamin (TAB-A-VITE/THERAGRAN) per tablet Take 1 tablet by mouth daily.     ??? OLANZapine (ZYPREXA) 5 MG tablet Take 1 tablet (5 mg total) by mouth nightly. 90 tablet 1   ??? pantoprazole (PROTONIX) 40 MG tablet Take 1 tablet (40 mg total) by mouth daily. 30 tablet 1   ??? levETIRAcetam (KEPPRA) 500 MG tablet Take 1 tablet (500 mg total) by mouth Two (2) times a day. 60 tablet 4     No current facility-administered medications for this visit.         VITAL SIGNS:   Vitals:    01/09/22 1056   BP: 120/87   Pulse: 68   Resp: 18   Temp: 36.2 ??C (97.2 ??F)   TempSrc: Temporal   SpO2: 98%       Physical Exam  Constitutional:       General: Andre Duncan is not in acute distress.  Eyes:      General: No scleral icterus.     Extraocular Movements: Extraocular movements intact.      Pupils: Pupils are equal, round, and reactive to light.   Cardiovascular:      Rate and Rhythm: Regular rhythm.      Heart sounds: Normal heart sounds.   Pulmonary:      Effort: Pulmonary effort is normal.      Breath sounds: Normal breath sounds.   Abdominal:      Palpations: Abdomen is soft.      Tenderness: There is no abdominal tenderness.   Musculoskeletal:         General: No swelling.      Comments: Able to position to exam table independently.  Interactive during visit.    Skin:     General: Skin is warm and dry.   Neurological:      General: No focal deficit present.      Mental Status: Andre Duncan is alert.   Psychiatric: Mood and Affect: Mood normal.         Behavior: Behavior normal.  LABORATORY:  Lab on 01/09/2022   Component Date Value Ref Range Status   ??? Sodium 01/09/2022 142  135 - 145 mmol/L Final   ??? Potassium 01/09/2022 4.0  3.4 - 4.8 mmol/L Final   ??? Chloride 01/09/2022 109 (H)  98 - 107 mmol/L Final   ??? CO2 01/09/2022 27.0  20.0 - 31.0 mmol/L Final   ??? Anion Gap 01/09/2022 6  5 - 14 mmol/L Final   ??? BUN 01/09/2022 9  9 - 23 mg/dL Final   ??? Creatinine 01/09/2022 0.80  0.60 - 1.10 mg/dL Final   ??? BUN/Creatinine Ratio 01/09/2022 11   Final   ??? eGFR CKD-EPI (2021) Male 01/09/2022 >90  >=60 mL/min/1.17m2 Final    eGFR calculated with CKD-EPI 2021 equation in accordance with [employer] and [employer] of Nephrology Task Force recommendations.   ??? Glucose 01/09/2022 91  70 - 179 mg/dL Final   ??? Calcium 16/07/9603 8.8  8.7 - 10.4 mg/dL Final   ??? Albumin 54/06/8118 3.6  3.4 - 5.0 g/dL Final   ??? Total Protein 01/09/2022 6.6  5.7 - 8.2 g/dL Final   ??? Total Bilirubin 01/09/2022 0.7  0.3 - 1.2 mg/dL Final   ??? AST 14/78/2956 12  <=34 U/L Final   ??? ALT 01/09/2022 19  10 - 49 U/L Final   ??? Alkaline Phosphatase 01/09/2022 46  46 - 116 U/L Final   ??? WBC 01/09/2022 10.7  3.6 - 11.2 10*9/L Final   ??? RBC 01/09/2022 4.23 (L)  4.26 - 5.60 10*12/L Final   ??? HGB 01/09/2022 14.2  12.9 - 16.5 g/dL Final   ??? HCT 21/30/8657 41.7  39.0 - 48.0 % Final   ??? MCV 01/09/2022 98.6 (H)  77.6 - 95.7 fL Final   ??? MCH 01/09/2022 33.6 (H)  25.9 - 32.4 pg Final   ??? MCHC 01/09/2022 34.1  32.0 - 36.0 g/dL Final   ??? RDW 84/69/6295 13.4  12.2 - 15.2 % Final   ??? MPV 01/09/2022 8.1  6.8 - 10.7 fL Final   ??? Platelet 01/09/2022 207  150 - 450 10*9/L Final   ??? Neutrophils % 01/09/2022 63.8  % Final   ??? Lymphocytes % 01/09/2022 26.6  % Final   ??? Monocytes % 01/09/2022 8.4  % Final   ??? Eosinophils % 01/09/2022 0.8  % Final   ??? Basophils % 01/09/2022 0.4  % Final   ??? Absolute Neutrophils 01/09/2022 6.8  1.8 - 7.8 10*9/L Final   ??? Absolute Lymphocytes 01/09/2022 2.9  1.1 - 3.6 10*9/L Final   ??? Absolute Monocytes 01/09/2022 0.9 (H)  0.3 - 0.8 10*9/L Final   ??? Absolute Eosinophils 01/09/2022 0.1  0.0 - 0.5 10*9/L Final   ??? Absolute Basophils 01/09/2022 0.0  0.0 - 0.1 10*9/L Final

## 2022-01-09 NOTE — Unmapped (Signed)
RADIATION ONCOLOGY Follow up Note    Encounter Date: 01/09/2022  Patient Name: Andre Duncan  Medical Record Number: [id number]    Referring Physician: Dr. Elmon Kirschner    ASSESSMENT:  56 year old man with primary CNS lymphoma s/p matrix regimen, high dose methotrexate, and consolidative radiation with 2340cGy in 02/2021, now s/p CyberKnife (500cGy x 5) 09/2021 for relapsed disease and currently receiving ibrutinib     MRI today with subtle T1 C+ enhancement in the right medial occipital lobe bordering the prior CK volume. Unlikely to be the cause of his symptoms. No obvious evidence of active disease.     PLAN:  RTC in 2 mos with brain MRI   Off steroids  Continue memantine  Currently on Ibrutinib   Will see Medical Oncology today       --------------------------    Interval History:  Pt is seen with his wife. Continues to have fatigue, tires easily with PT, short term memory loss. No focal weakness or numbness. Has a difficult time participating in PT due to fatigue. Wife mentions she thinks he has limited but small left sided visual field loss since diagnosis. The patient denies this. Denies double vision or blurry vision. No headaches. Not taking steroids but back on mematine which they believe is helping with cognitive functioning. He continues to take ibrutinib.     MRI brain today:  Stable confluent T2/FLAIR signal abnormality within the corpus callosum and periventricular deep white matter predominantly involving the frontoparietal lobes. Findings may represent posttreatment changes and superimposed chronic small vessel ischemic disease.   ??           Oncology History   Primary CNS lymphoma (CMS-HCC)   11/28/2020 Initial Diagnosis    Primary CNS lymphoma (CMS-HCC)     11/30/2020 - 11/30/2020 Chemotherapy    IP LYMPHOMA HIGH-DOSE METHOTREXATE  riTUXimab 375 mg/m2 (standard and rapid infusion) IV on day 1, vinCRIStine 1.4 mg/m2 (max 2 mg) IV on day 1, DOXOrubicin 50 mg/m2 IV on day 1, cyclophosphamide 750 mg/m2 IV on day 1, predniSONE 100 mg PO on days 1-5, methotrexate (high-dose) IV on Day 15 (even cycles), every 21-day.     01/03/2021 -  Radiation    Radiation Therapy Treatment Details (Noted on 01/03/2021)  Site: Midline Brain  Technique: 3D CRT  Goal: No goal specified  Planned Treatment Start Date: No planned start date specified     08/23/2021 -  Radiation    Radiation Therapy Treatment Details (Noted on 08/23/2021)  Site: Bilateral Brain  Technique: SRS  Goal: No goal specified  Planned Treatment Start Date: No planned start date specified       ROS:  A 10 systems was negative except for pertinent positives noted in HPI.    Physical Examination:  VITAL SIGNS: Temp 36.9 ??C (98.4 ??F) (Temporal)  - Wt 88.2 kg (194 lb 8 oz)  - BMI 24.96 kg/m??   CONSTITUTIONAL:   Engaged middle aged male  EYES:  Sclera anicteric, EOMI,   Ears, Nose, Mouth, Throat: Facial movements full and symmetric, tongue midline. No visual field deficits.   NECK:  Supple  HEME/LYMPH/IMMUNO:  No palpable cervical LAN.  CV:  Regular rate   RESPIRATORY:  Decreased bs all fields  MUSCULOSKELETAL: Independent gait, no assistive devices.  5/5 upper and lower  PSYCHIATRIC:  Mood is normal.    EXTREMITIES:     No edema  NEUROLOGIC:  Oriented x 3.      Imaging:    MRI  brain reviewed today with some T1 C+ enhancement in right midline occipital lobe. Very subtle. Formal read:  Stable confluent T2/FLAIR signal abnormality within the corpus callosum and periventricular deep white matter predominantly involving the frontoparietal lobes. Findings may represent posttreatment changes and superimposed chronic small vessel ischemic disease.   ??         Electronically signed by:  Blythe Stanford, MD  Radiation Oncology Resident, PGY-4  [facility]  Pager: [phone]    -I saw and evaluated/examined the patient, participating in the key portions of the service.  I discussed the findings, assessment, and plan of care with the resident/APP. I personally reviewed all relevant data associated with this encounter including imaging, labwork, and prior records. I have edited and agree with the findings and plan as documented in the resident/APP's note.  Dana L. Baird Lyons, MD  Assistant Professor  Department of Radiation Oncology

## 2022-01-12 NOTE — Unmapped (Signed)
Addended by: Lorella Nimrod E on: 01/12/2022 12:56 PM     Modules accepted: Level of Service

## 2022-01-14 NOTE — Unmapped (Signed)
Pulmonary Clinic - Initial Visit    Referring Physician :  Gwendolyn Grant, NP  PCP:     Herschell Dimes, FNP  Reason for Consult:   Abnormal PFTs, abnormal Chest Imaging, COPD    ASSESSMENT and PLAN     Andre Duncan is a 56 y.o. male with history of CNS lymphoma, tobacco use, COPD, prior aspiration pneumonias, whom we are seeing in consultation requested by Gwendolyn Grant, NP for evaluation of COPD, abnormal imaging and abnormal PFTs as part of evaluation for possible bone marrow transplant.    Will reach out to talk with oncology and BMT team about their thoughts and weighing risks - he does have DLCO that remains at 50%, PFTs today with more evidence of obstructive component that goes along with COPD and so we will start LAMA/LABA - but smoking cessation is critical. His PFTs have also had reduced FVC and with his reduced DLCO and history of possible cytarabine toxicity by report and with imaging around that time being called pneumonia it is a little hard to tell but could certainly also have component of restrictive lung disease. Prior episodes of aspiration could have also led to scarring. Will repeat CT chest now that he is well/at respiratory baseline to assess lung parenchyma and also round out testing with lung volumes. Will get 6 minute walk to evaluate for exertional hypoxemia as another functional measure that may be helpful information for the teams determining next best steps for his CNS lymphoma. The fact that he has had a lot of pneumonias is also a concerning consideration.     Tobacco use - one of the most improtant things he can do is quit smoking, unclear if will help lung function numbers but may help symptoms and help prevent further lung function decline.   - cessation recommended  - referral to tobacco cessation counseling  - wellbutrin seems like not a good option given seizure history  - information about using patch and gum provided today, counseling provided     COPD - does have mild emphysematous changes on CT from 11/02/21, spirometry more suggestive of restrictive process given preserved FEV1/FVC ratio previously , though could be mixed picture with gas trapping.   - lung volumes as above  - start LAMA/LABA given recurrent pneumonias will hold off on adding ICS but may still be reasonable to do so in future - Anoro ellipta ordered and teaching provided  - use spacer with albuterol as needed    Abnormal PFTs - restrictive pattern suggested previously with symmetrically reduced FEV1 and FVC though today FEV1/FVC ratio is just at the lower limit of normal which goes along with obstruction, lung volumes would be beneficial to further delineate processes that could be occurring; with severely reduced DLCO stable today at 50%.   - lung volumes  - 6 minute walk     Abnormal chest CT - basilar ggo, though has had previously abnormal lung bases on prior CT Body 07/2020 and has had multiple episodes of aspiration/pneumonia in past per wife. Has had linear atelectasis vs basilar scarring seen on multiple CXRs since 2022, and per wife some of this came about after cytarabine. See discussion of PFTs but has potential to have both COPD as well as more restrictive process related to prior scarring from infections or prior chemotherapy agents.   - repeat HRCT while well - ordered today   - sputum culture if able  - lung volumes as above    Snoring,  pauses in breathing  - sleep study ordered    Health Maintenance  - Lung cancer screening: ongoign tobacco use, getting CT imaging as above for other reasons but would qualify based on amount of tobacco use, age >62    Immunization History   Administered Date(s) Administered   ??? COVID-19 VACCINE,MRNA(MODERNA)(PF) 12/28/2019, 01/23/2020   ??? INFLUENZA INJ MDCK PF, QUAD,(FLUCELVAX)(47MO AND UP EGG FREE) 07/17/2021   ??? Influenza Vaccine Quad (IIV4 PF) 60mo+ injectable 08/15/2020   ??? Pneumococcal Conjugate 13-Valent 08/01/2020        This patient was seen and discussed with attending physician, Dr. Sammuel Cooper, who agrees with the assessment and plan above. Return in about 3 months (around 04/16/2022) for Recheck.    GN:FAOZH Roxana Hires, NP, Herschell Dimes, FNP    Murlean Iba, MD  Pulmonary & Critical Care Fellow  Pager [phone]   January 15, 2022    HISTORY:     History of Present Illness:  [name] is a 56 y.o. male with a history of CNS lymphoma (diagnosed 2021), COPD by report, ongoign tobacco use whom we are seeing in consultation requested by Gwendolyn Grant for evaluation of COPD, abnormal Chest CT, abnormal PFTs    Reason for referral: abnormal CT, low DLCO and consideration of BMT    Patient accompanied by wife and interpretor today, wife reports that he developed lung scarring from cytarabine, and episodes of pneumonia going through chemo - had neuro toxicity and high fevers, shortly after infusion, noticed low oxygen and changes on x-ray - low 90s    Previously discussed bone marrow transplant - Not a bone marrow transplant candidate previously due to low PFTs (corrected DLCO 49% in 12/2020) and ongoing tobacco use though is smoking less now, and oncology team weighing risks/benefits of autoSCT given poor prognosis of his CNS lymphoma  Rather than BMT last year, he underwent total brain radiation instead, had pretty good response - almost cleared but not completely   Had relapse in November, had radiation to 2 new lesions,   Had seizures as well  A lot of confusion after this, has been restarted on memantine which has been helfpul     After relapse, also had increased issues with his breathing this winter -   Pneumonia diagnosed in 09/2021 (though imaging from this encounter showed only stable scarring/atelectasis at lung base), treated with doxycycline and augmentin; also tested positive for Influenza A on 09/27/21; returned to ED 11/02/21, CT showed mild pneumonia vs aspiration and so he was treated with levaquin  (LRCX grew OPF)  Saw PCP 11/14/21 for ongoign cough that was imporivng, sputum no longer dark brown but was lighter brown  Was SOB with severe coughing episodes but this was infrequent  Workign with PT for dyspnea on exertion - does get short of breath going to bathroom and back, worse for 5-6 months but is also related to fatigue, maybe more than breathing but he will look SOB to others -    Today reports he is at his respiratory baseline - Cough currently occasionally throughout the day, when he coughs stuff up it looks a little yellow - is overall improved from when he was sick this winter. Cough worst in the morning when he first wakes up; No frank blood, but was rusty in the winter when sick. Other triggers for cough include smoking.     Tobacco use - 1 ppd currently, used to smoke 3-4 ppd, started smoking at age 74 - quit  for about 6 months while in hospital, getting chemo, but has started smoking again since April 2022 due to the stress of the cancer; When he quit previously, patch was more helpful, did not like gum - won't wear patch very long - have discussed wellbutrin before but interacted with other meds (and has since had seizures)   Did smoke 30 min before lung function testing today      Diagnosis of COPD many years ago, diagnosed in 2013-2014  Steroid courses - dexamethasone hasn't helped breathing when on it - wife does think he has been on steroids in past  Hospitalization for pneumonia/COPD - none, but ED visit January 2023 may have gotten steroids for possible component of COPD; in Care Everywhere I can see he got azithromycin and steroids for exacerbation 03/09/21,   Medications tried - duoneb, maybe symbicort remotely in the past but not currently   Does get wheezing in chest more in morning, albuterol releives it  Albuterol use - a couple of times a week at baseline, does seem to help, used more when sick with pneumonia and flu   No asthma or breathing problems growing up that he recalls    Sinus symptoms: some with spring pollen, including nasal drainage,  - uses flonase and zyrtec    GERD - is on pantoprazole 40 mg daily and this helps  No coughing, choking when eating that he reports  No dysphagia  Initial cancer symptoms were weakness, fatigue, loss of appetite, loss of weight - appetite has been ok and weight has been more stable now; Currently at fairly normal weight for him  Nausea now improved, had a lot of issues with vomiting - also had impaired swallowing early on, and had issues with aspiration pneumonia and so wife has had some ongoing ocncerns about that       PMH: CNS Lymphoma   has a past medical history of Anxiety, COPD (chronic obstructive pulmonary disease) (CMS-HCC), Cytarabine poisoning, Depression, Diabetes mellitus (CMS-HCC), Pneumonia, aspiration (CMS-HCC), and Prostate atrophy.      SurgHx: No past surgical history on file.         FH: ?? Doesn't know if anyone else has lung diseases    family history includes Cancer in his father and mother; Diabetes in his mother.     SocHx: ?? Tobacco: 30+ pack years, current 1 ppd   ?? EtOH: some but not daily   ?? Drugs: none  ?? Living situation: lives at home with his Wife  ?? Occupational: Sells tobacco, which makes quitting challenging   ?? Pets: lots - dog and cat - no birds  ?? In the [place] since 1997, From [place]     reports that he has been smoking cigarettes. He has a 30.00 pack-year smoking history. He has never used smokeless tobacco. He reports current alcohol use of about 4.0 standard drinks per week. He reports that he does not use drugs.      Meds: Personally reviewed in Epic. Pertinent pulmonary meds:  ?? Albuterol  ?? Zyrtec  ?? Flonase  ?? ibrutinib (Bruton Tyrosine Kinase Inhibitor) - seems to be working well, MRI on this was showing treatment related   ?? protonix 40 mg daily   ?? Back on memantine again - has helped short term memory loss     Allergies: Allergies   Allergen Reactions   ??? Sulfa (Sulfonamide Antibiotics) Rash     Developed during hospitalization d/c    ??? Tegaderm Adhesive-No Drug-Allergy Check  Other (See Comments)        ROS: 12-point ROS reviewed with patient with pertinent positives and negatives as per HPI.     No past surgical history on file.    Current Outpatient Medications on File Prior to Visit   Medication Sig Dispense Refill   ??? cetirizine (ZYRTEC) 10 MG tablet Take 1 tablet (10 mg total) by mouth daily as needed.     ??? cholecalciferol, vitamin D3-50 mcg, 2,000 unit,, 50 mcg (2,000 unit) tablet Take 1 tablet (50 mcg total) by mouth daily.     ??? escitalopram oxalate (LEXAPRO) 20 MG tablet Take 1 tablet (20 mg total) by mouth daily. 90 tablet 2   ??? finasteride (PROSCAR) 5 mg tablet Take 1 tablet (5 mg total) by mouth daily.     ??? fish oil/borage/flax/om3,6,9 1 (OMEGA 3-6-9 COMPLEX ORAL) Take 1 capsule by mouth daily.     ??? fluoride, sodium, (SF 5000 PLUS) 1.1 % Crea USE  SMALL AMOUNT TWICE DAILY 51 g 1   ??? ibrutinib (IMBRUVICA) 560 mg tablet Take 1 tablet (560 mg total) by mouth daily. 28 tablet 11   ??? levETIRAcetam (KEPPRA) 500 MG tablet Take 1 tablet (500 mg total) by mouth Two (2) times a day. 60 tablet 4   ??? memantine (NAMENDA) 10 MG tablet Take 1 tablet (10 mg total) by mouth Two (2) times a day. 60 tablet 5   ??? metFORMIN (GLUCOPHAGE) 500 MG tablet Take 2 tablets (1,000 mg total) by mouth in the morning and 2 tablets (1,000 mg total) in the evening. Take with meals. 180 tablet 3   ??? mirtazapine (REMERON) 30 MG tablet Take 1 tablet (30 mg total) by mouth nightly. 90 tablet 1   ??? multivitamin (TAB-A-VITE/THERAGRAN) per tablet Take 1 tablet by mouth daily.     ??? OLANZapine (ZYPREXA) 5 MG tablet Take 1 tablet (5 mg total) by mouth nightly. 90 tablet 1   ??? pantoprazole (PROTONIX) 40 MG tablet Take 1 tablet (40 mg total) by mouth daily. 30 tablet 1     No current facility-administered medications on file prior to visit.       PHYSICAL EXAM:   BP 119/84 (BP Site: L Arm, BP Position: Sitting, BP Cuff Size: Medium)  - Pulse 78  - Temp 36.4 ??C (97.5 ??F) (Temporal)  - Ht 185 cm (6' 0.84)  - Wt 86.2 kg (190 lb)  - SpO2 93%  - BMI 25.18 kg/m??   General: Well appearing, no distress  Eyes: EOMI, sclera anicteric  HENT: MMM, clear oropharynx  CV: RRR, no m/r/g  Lungs: Normal work of breathing without accessory muscle usage, coarse crackles at bases but no wheezes on forced exhalation   Abd: Soft, NT, ND, no rebound or guarding  Ext: Warm, well perfused, no clubbing, no cyanosis, no edema   Skin: No rashes, dry  Neuro: No focal deficits, alert & oriented    LABORATORY and RADIOLOGY DATA:     Pulmonary Function Tests/Interpretation:    Date FEV1  (Pre/Post) FVC  (Pre/Post) FEV1/FVC  (Pre/Post) DLCO   12/11/2020  2.68 63.8% 3.77 69.9% 0.71 45.1  hgb 12.1-->49     Spirometry 12/11/20 - suggestive of mild restriction, lung volumes recommended; DLCO severely reduced even when corrected for Hgb of 12.1    Pertinent Laboratory Data:  LRCX 11/02/21 - 3+ GPC, >25 PMN, speciated as OPF    Pertinent Imaging Data:    CT Chest 11/02/21  FINDINGS:   LUNGS AND  AIRWAYS: Respiratory motion degrades the examination, but there are areas of predominantly linear consolidation in both bases likely postinflammatory scarring. There are other areas of multifocal groundglass attenuation in the lower lungs which probably represent a more acute process, either pneumonia or aspiration. Subjective thickening of the central airways indicates inflammation, acute or chronic. There is minimal emphysema in the upper lobes.  The central airways are patent.      PLEURA: No pleural fluid or pneumothorax.     MEDIASTINUM AND LYMPH NODES: No enlarged intrathoracic or axillary lymph nodes are present.  No other mediastinal abnormality.     HEART AND VASCULATURE:The cardiac chambers are normal in size.  There is no pericardial effusion.  Ascending and descending aorta normal in caliber.  Pulmonary artery normal in size.  Minimal coronary calcification.     BONES AND SOFT TISSUES: Unremarkable.     UPPER ABDOMEN: Fatty liver.     OTHER: No other significant findings.    Impression:  Areas of groundglass attenuation in the lower lungs with diffuse bronchial wall thickening. The findings may reflect minimal pneumonia or aspiration.       CT Body 08/03/20  L>R pleural effusions, with ground glass and consolidation in visualized lower lobes    Prior CXR 12/11/20 clear lung with basilar scarring, 08/15/21 CXR with clear lungs and basilar atelectasis/scarring, 09/27/21 stable linear scar in lingula, 11/02/21 unchanged left sided linear opacities likely scar/atelectasis    Echo 12/10/20  Summary    1. The left ventricle is normal in size with normal wall thickness.    2. The left ventricular systolic function is normal, LVEF is visually  estimated at > 55%.    3. The right ventricle is normal in size, with normal systolic function.    4. There is no pulmonary hypertension, estimated pulmonary artery systolic  pressure is 21 mmHg.

## 2022-01-15 ENCOUNTER — Ambulatory Visit: Admit: 2022-01-15 | Discharge: 2022-01-15 | Payer: PRIVATE HEALTH INSURANCE

## 2022-01-15 DIAGNOSIS — Z720 Tobacco use: Principal | ICD-10-CM

## 2022-01-15 DIAGNOSIS — Z8701 Personal history of pneumonia (recurrent): Principal | ICD-10-CM

## 2022-01-15 DIAGNOSIS — R942 Abnormal results of pulmonary function studies: Principal | ICD-10-CM

## 2022-01-15 DIAGNOSIS — R0609 Other forms of dyspnea: Principal | ICD-10-CM

## 2022-01-15 DIAGNOSIS — R0683 Snoring: Principal | ICD-10-CM

## 2022-01-15 DIAGNOSIS — J439 Emphysema, unspecified: Principal | ICD-10-CM

## 2022-01-15 DIAGNOSIS — C8330 Diffuse large B-cell lymphoma, unspecified site: Principal | ICD-10-CM

## 2022-01-15 MED ORDER — ALBUTEROL SULFATE HFA 90 MCG/ACTUATION AEROSOL INHALER
RESPIRATORY_TRACT | 3 refills | 0 days | Status: CP | PRN
Start: 2022-01-15 — End: ?

## 2022-01-15 MED ORDER — PANTOPRAZOLE 40 MG TABLET,DELAYED RELEASE
ORAL_TABLET | Freq: Every day | ORAL | 1 refills | 30 days | Status: CP
Start: 2022-01-15 — End: 2023-01-15

## 2022-01-15 MED ORDER — UMECLIDINIUM 62.5 MCG-VILANTEROL 25 MCG/ACTUATION POWDR FOR INHALATION
Freq: Every day | RESPIRATORY_TRACT | 3 refills | 90 days | Status: CP
Start: 2022-01-15 — End: 2023-01-15

## 2022-01-15 NOTE — Unmapped (Signed)
I saw and evaluated the patient, participating in the key portions of the service.  I reviewed the resident???s note.  I agree with the resident???s findings and plan. Pablo Ledger, MD

## 2022-01-15 NOTE — Unmapped (Signed)
It was so nice to see you today! Follow up in 3 months       We discussed  Repeat CT scan while well to assess your lungs  Additional breathing tests (walk test to see if need oxygen while walking, lung volume measurements)  Start Anoro Ellipta (LAMA/LABA type COPD medicine) - if too expensive let me know there are other options  Use albuterol as needed with spacer for cough, wheeze, shortness of breath  Sleep study ordered  Tobacco cessation counseling referral - try to cut back as much as you can this is one of the most important things you can do for your health!      Contact information:  Our clinic is located at:  [address]  [place], [place] [address]    You can reach [place] at:  Front Desk/Appointments: [phone]  Fax: [phone]  For urgent needs overnight or on weekends when clinic is closed, you can call the hospital operator at [phone] and ask for pulmonary doctor on call       Quitting smoking is the single most important thing you can do to improve your health today.    Your health care team of doctors and nurses understand how hard it is to quit smoking. The first step is to understand why it is important to YOU to stop smoking. Here are some things to think about:    1. It is never too late to quit, no matter how old you are or how long you have been smoking. We know from studies that even people who quit at the age of 1-60 can gain 4-5 more years of life than if they kept on smoking.     2. If you are smoking one pack a day now, and you quit for a year, you save around $2000! Think about what you can do with that money- pay off bills, take a trip with your family or something else important to you.     3.  Your smoking harms the people around you- your spouse, your children, your friends. If you smoke with friends and family, your quitting can help them quit too. Don't just think about the benefits for yourself.    4. Quitting smoking will decrease the amount of time you spend in doctor's offices and the hospital. It will also improve your overall health.    5. When you stop smoking, so many things change- food tastes better, your clothes and  breath don't smell, you are less winded- think of all these benefits!    6. Yes- quitting smoking is hard- but it is the single most important thing you can do to improve your health. You owe it to yourself, your family and your doctors who care for you.     7. There are medicines that can help ease the craving. But the first step starts with you being ready to quit.     8. There is support to help you learn more about quitting!  -- www.Quitline[place].com and TotallyWiped.com.ee  -- MechanicalArm.dk, BecomeAnEx.org and www.smokingstopshere.com  -- www.Livehelp.cancer.gov provides live online chat support  -- There is a free quit line (1-800-QUIT-NOW) that can provide counseling and sometimes free patches/gum.     The [facility] can provide you with the information and tools you need to become tobacco free. A treatment specialist will work with you to make a plan that works for you and your lifestyle including  dealing with withdrawal, using tobacco cessation medications, and strategies for changing behaviors that are linked to your tobacco use. We will follow-up with you for adjustment of the treatment plan and medications, as needed, and give you on-going support to stop smoking and prevent relapse.    Individual appointments with a tobacco treatment specialist at the [facility] are scheduled on Tuesday mornings and Wednesdays. In order to spend the majority of the first visit focusing on the treatment plan, we ask new patients to complete an Initial Assessment online before the first appointment.    To make an appointment today call [phone] or e-mail [email or web address]    Now that you are ready to quit, here is your plan to quit smoking:    1. Set a quit date for the next week. After this day, you should not smoke any cigarettes- not even one! Having one cigarette can cause you to start smoking as much as you are now. Tell your friends and family about your quit date. They can be your support during the hard times you may encounter while you quit.    2. Prior to your quit date, think about how you will plan to handle cravings. Some ways to handle this are to avoid triggers (reduce coffee and alcohol, take a break from friends who smoke) and have an action plan for a craving (take a walk around the block, think about how not smoking improves your health and those around you).    3. The best approach is to use the nicotine patch COMBINED with the nicotine gum.    Here is how to use it:    a. Start with the 21 mg patch, one a day: Place on in the morning, leave on all day- it is OK to remove at bedtime if you have trouble sleeping.     b. Use the nicotine gum for cravings: If you have a craving, leave the patch on, place a piece of gum in your mouth and chew until the flavor is released. Then stop chewing and place in between the teeth and cheek (this is where the nicotine is absorbed)- park it there until the flavor goes away, then chew 2-3 more chews until the flavor comes back and re-park in in your cheek. Continue for 30 minutes.    Avoid over-chewing as this releases the nicotine too quickly and it will be swallowed and not absorbed. You can use the gum several times a day for cravings.    4. Plan some rewards for yourself to help you stay on track with quitting:  -- After your first week of quitting, buy something nice for yourself with the money you saved  -- After the first month of quitting, go out and celebrate with family and friends.

## 2022-01-16 DIAGNOSIS — Z720 Tobacco use: Principal | ICD-10-CM

## 2022-01-16 DIAGNOSIS — Z8701 Personal history of pneumonia (recurrent): Principal | ICD-10-CM

## 2022-01-16 DIAGNOSIS — R0609 Other forms of dyspnea: Principal | ICD-10-CM

## 2022-01-16 DIAGNOSIS — R942 Abnormal results of pulmonary function studies: Principal | ICD-10-CM

## 2022-01-16 DIAGNOSIS — J439 Emphysema, unspecified: Principal | ICD-10-CM

## 2022-01-16 MED ORDER — BEVESPI AEROSPHERE 9 MCG-4.8 MCG HFA AEROSOL INHALER
Freq: Two times a day (BID) | RESPIRATORY_TRACT | 6 refills | 30 days | Status: CP
Start: 2022-01-16 — End: ?

## 2022-01-16 MED ORDER — UMECLIDINIUM 62.5 MCG-VILANTEROL 25 MCG/ACTUATION POWDR FOR INHALATION
Freq: Every day | RESPIRATORY_TRACT | 3 refills | 90 days | Status: CN
Start: 2022-01-16 — End: 2023-01-16

## 2022-01-17 NOTE — Unmapped (Signed)
[facility] Refill Coordination Note    Specialty Medication(s) to be Shipped:   Hematology/Oncology: Imbruvica    Other medication(s) to be shipped: No additional medications requested for fill at this time     Andre Duncan, DOB: 1966/06/25  Phone: [phone] (home)       All above HIPAA information was verified with patient's family member, Andre Duncan.     Was a Nurse, learning disability used for this call? No    Completed refill call assessment today to schedule patient's medication shipment from the [facility] [phone]).  All relevant notes have been reviewed.     Specialty medication(s) and dose(s) confirmed: Regimen is correct and unchanged.   Changes to medications: Andre Duncan reports no changes at this time.  Changes to insurance: No  New side effects reported not previously addressed with a pharmacist or physician: Yes - Patient reports loss of appetite and fatigue. Patient would not like to speak to the pharmacist today. Their provider is aware.  Questions for the pharmacist: No    Confirmed patient received a Conservation officer, historic buildings and a Surveyor, mining with first shipment. The patient will receive a drug information handout for each medication shipped and additional FDA Medication Guides as required.       DISEASE/MEDICATION-SPECIFIC INFORMATION        N/A    SPECIALTY MEDICATION ADHERENCE     Medication Adherence    Patient reported X missed doses in the last month: 0  Specialty Medication: Imbruvica 560mg   Patient is on additional specialty medications: No  Informant: Andre Duncan              Were doses missed due to medication being on hold? No    Imbruvica 560 mg: 12 days of medicine on hand       REFERRAL TO PHARMACIST     Referral to the pharmacist: Not needed      [facility]     Shipping address confirmed in Epic.     Delivery Scheduled: Yes, Expected medication delivery date: 01/25/22.     Medication will be delivered via Next Day Courier to the prescription address in Epic [place].    Andre Duncan   [facility] Specialty Technician

## 2022-01-28 MED FILL — IMBRUVICA 560 MG TABLET: ORAL | 28 days supply | Qty: 28 | Fill #5

## 2022-02-04 ENCOUNTER — Ambulatory Visit
Admit: 2022-02-04 | Discharge: 2022-02-05 | Payer: PRIVATE HEALTH INSURANCE | Attending: Student in an Organized Health Care Education/Training Program | Primary: Student in an Organized Health Care Education/Training Program

## 2022-02-04 MED ORDER — LEVETIRACETAM 500 MG TABLET
ORAL_TABLET | Freq: Two times a day (BID) | ORAL | 4 refills | 30 days | Status: CP
Start: 2022-02-04 — End: 2022-07-04

## 2022-02-04 MED ORDER — MEMANTINE 10 MG TABLET
ORAL_TABLET | Freq: Two times a day (BID) | ORAL | 5 refills | 30 days | Status: CP
Start: 2022-02-04 — End: ?

## 2022-02-04 NOTE — Unmapped (Signed)
Outpatient Neurology Consult Note     [facility]  [address]  [address]  [place] [address]    Date: 02/04/2022  Patient Name: Andre Duncan  MRN: [id number]  PCP: Perley Jain, Magnus Ivan, FNP     Assessment and Plan        Andre Duncan is a 56 y.o. male presenting in consultation for evaluation following ED presentation for seizure like activity.        Seizure    Patient appears to have at least 1 seizure (12/22), fall with loss of consciousness followed by 30 minutes of confusion.  Was started on Keppra 500 mg twice daily in the emergency department subsequently.  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  Patient also possibly had a second seizure on 09/20/21, where he fell and reports he lost consciousness, however there is no postictal confusion. Since then patient and patient's wife deny any seizures or any seizure like activity, while on Keppra. Pt recent MRI brain appears to show resolution of his masses. Since pt has been seizure free and has the CNS lymphoma appears to have improved, I don't not believe he will need to be on Keppra long term. However since the likely seizure occurred less then 6 months, pt should remain on his Keppra for now (tentatively planning for a 2 year course of Keppra). Pt denies any mood side effects from Keppra.      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 Recent MRI appears have showed resolution of masses. However, unfortunately patient appears to have developed cognitive deficits, such as word finding difficulty, impairments  with memory as well as visual-spatial deficits (which appear to correlate with his R parietal lobe lesion).  Patient and wife feel like the memantine he was previously on was effective. For this reason we decided to do another trail of memantine at last appt, which was titrated up to 10mg  BID. Pt and wife reports that this has significantly helped his cognition. He is able to do things such as file taxes, which wife states she thinks he would not have been able to do in the state he was in prior to restarting memantine. His MoCA today is stable at 19. Since pts reports this improvement memantine and denies any side effects, we will plan to keep pt on current does.            Plan:  -Continue Keppra 500 mg twice daily  -Continue Memantine 10mg  BID  -Continue follow-up with hematology and radiation oncology      Return Visit in: 6 months    I personally spent 50 minutes face-to-face and non-face-to-face in the care of this patient, which includes all pre, intra, and post visit time on the date of service.           Patient was discussed with Dr.Roque who agrees with assessment and plan.    Leo Grosser, MD Neurology (PGY-2)           HPI         HPI:   Andre Duncan is a 56 y.o. male with CNS lymphoma  presenting in consultation for evaluation following ED presentation for seizure like activity.      Patient has CNS  lymphoma, received chemotherapy as well as whole brain radiation, with recent relapse in November 2022 and subsequently restarted on radiation and Ibrutinib.  On September 05, 2021 he presented to the emergency department following an episode concerning for seizure.  Where he had an unwitnessed fall, his wife heard a thud came to check on him and saw him awake lying on the floor, however appeared very confused.  He subsequently  presented to the emergency department where, MRI showed no new lesions or enhancement, but persistent vasogenic edema most notable in right frontoparietal regions.  In the emergency department he appeared to be at his normal baseline and neuro exam was nonfocal.  He subsequently was discharged on 500 mg of Keppra twice daily (no load).  He reports he is tolerating this well without side effect.  Of note on September 20, 2021 he had a fall where he reports he lost consciousness (wife states that he did not lose consciousness) however there was no confusion afterwards.  Aside from this patient and wife deny any seizure-like activity or loss of consciousness episodes.  Since radiation patient's wife reports that his cognition has been declining.  He was started on Memantine for neurocognitive protection prophylaxis during his radiation last year.  Wife states she feels like his cognition was much better when he was on the memantine, however he was only on it for 6 months reportedly as part of the radiation protocol.  The wife says his cognitive deficits mostly include word finding difficulty and issues with short-term memory.  She feels like over the past 7 to 10 days his cognition is gradually getting worse.      Intervail history (10/22/21-02/04/22)  Pt and wife report that his cognition has improved significantly since memantine dose reached 10mg  BID. Pt is able to file taxes on his own. 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 He reports no side effect from the memantine. Also pt and wife state he has not had any seizure or seizure like activity since December. He also declines any mood side effects from keppra.     Allergies   Allergen Reactions   ??? Sulfa (Sulfonamide Antibiotics) Rash     Developed during hospitalization d/c    ??? Tegaderm Adhesive-No Drug-Allergy Check Other (See Comments)        Current Outpatient Medications   Medication Sig Dispense Refill   ??? albuterol HFA 90 mcg/actuation inhaler Inhale 2-4 puffs every four (4) hours as needed for wheezing. With Spacer 18 g 3   ??? cetirizine (ZYRTEC) 10 MG tablet Take 1 tablet (10 mg total) by mouth daily as needed.     ??? cholecalciferol, vitamin D3-50 mcg, 2,000 unit,, 50 mcg (2,000 unit) tablet Take 1 tablet (50 mcg total) by mouth daily.     ??? escitalopram oxalate (LEXAPRO) 20 MG tablet Take 1 tablet (20 mg total) by mouth daily. 90 tablet 2   ??? finasteride (PROSCAR) 5 mg tablet Take 1 tablet (5 mg total) by mouth daily.     ??? fish oil/borage/flax/om3,6,9 1 (OMEGA 3-6-9 COMPLEX ORAL) Take 1 capsule by mouth daily.     ??? fluoride, sodium, 1.1 % Gel Brush teeth with a pea-sized amount of the paste for 2 min and spit.  No rinsing. Do not eat or drink for 30 minutes after use. Use 2x/day. 56 g prn   ??? glycopyrrolate-formoteroL (BEVESPI AEROSPHERE) 9-4.8 mcg HFAA inhaler Inhale 2 puffs Two (2) times a day. Use with spacer. 10.7  g 6   ??? ibrutinib (IMBRUVICA) 560 mg tablet Take 1 tablet (560 mg total) by mouth daily. 28 tablet 11   ??? levETIRAcetam (KEPPRA) 500 MG tablet Take 1 tablet (500 mg total) by mouth Two (2) times a day. 60 tablet 4   ??? memantine (NAMENDA) 10 MG tablet Take 1 tablet (10 mg total) by mouth Two (2) times a day. 60 tablet 5   ??? metFORMIN (GLUCOPHAGE) 500 MG tablet Take 2 tablets (1,000 mg total) by mouth in the morning and 2 tablets (1,000 mg total) in the evening. Take with meals. 180 tablet 3   ??? mirtazapine (REMERON) 30 MG tablet Take 1 tablet (30 mg total) by mouth nightly. 90 tablet 1   ??? multivitamin (TAB-A-VITE/THERAGRAN) per tablet Take 1 tablet by mouth daily.     ??? OLANZapine (ZYPREXA) 5 MG tablet Take 1 tablet (5 mg total) by mouth nightly. 90 tablet 1   ??? pantoprazole (PROTONIX) 40 MG tablet Take 1 tablet (40 mg total) by mouth daily. 30 tablet 1     No current facility-administered medications for this visit.       Past Medical History:   Diagnosis Date   ??? Anxiety    ??? COPD (chronic obstructive pulmonary disease) (CMS-HCC)    ??? Cytarabine poisoning    ??? Depression    ??? Diabetes mellitus (CMS-HCC)    ??? Pneumonia, aspiration (CMS-HCC)    ??? Prostate atrophy        No past surgical history on file.    Social History     Socioeconomic History   ??? Marital status: Married   Tobacco Use   ??? Smoking status: Every Day     Packs/day: 1.00 Years: 30.00     Pack years: 30.00     Types: Cigarettes   ??? Smokeless tobacco: Never   Vaping Use   ??? Vaping Use: Never used   Substance and Sexual Activity   ??? Alcohol use: Yes     Alcohol/week: 4.0 standard drinks     Types: 4 Cans of beer per week   ??? Drug use: Never   ??? Sexual activity: Yes     Partners: Female       Family History   Problem Relation Age of Onset   ??? Cancer Mother    ??? Diabetes Mother    ??? Cancer Father             Objective        Vital signs: There were no vitals taken for this visit.     There were no vitals filed for this visit.      Physical Exam:  .General Appearance: Well appearing. In no acute distress.  Eyes: Sclera are anicteric and without injection. No discharge.   HENT: Head is atraumatic and normocephalic. Oropharyngeal membranes are moist with no erythema or exudate.  Neck: Grossly normal range of motion.  Lymphatic: Deferred.  Respiratory: Normal work of breathing.  Cardiovascular: Warm and well-perfused.  Gastrointestinal: Nondistended.    Extremities: No clubbing, cyanosis, or edema.  Skin: no rashes or significant lesions on examined skin.  Psychiatric: Behavior and affect are appropriate.     Neurological Examination:     Mental Status: Alert, conversant, able to follow conversation and interview. Spontaneous speech was fluent without word finding pauses, dysarthria, or paraphasic errors. Comprehension was intact. Memory for recent and remote events was intact.   MCoA score 19     Cranial Nerves: PERRL. Pursuit eye  movements were uninterrupted with full range and without more than end-gaze nystagmus. Facial sensation intact bilaterally to light touch in all three divisions of CNV. Face symmetric at rest. Normal facial movement bilaterally, including forehead, eye closure and grimace/smile. Hearing intact to conversation. Shoulder shrug full strength bilaterally. Palate movement is symmetric. Tongue protrudes midline and tongue movements are normal.     Motor Exam: Normal bulk.  No tremors, myoclonus, or other adventitious movement.  Pronator drift is absent.  UE R/L: deltoid 5/5, biceps 5/5, triceps 5/5, and hand grip strong/strong.  LE R/L: hip flexion 5/5, hip extension 5/5, hip abduction 5/5, hip adduction 5/5, quadriceps 5/5, hamstrings 5/5, dorsiflexion 5/5, and plantar flexion 5/5.    Reflexes: DTRs R/L: biceps 2+/2+, brachioradialis 2+/2+, patella 2+/2+, and ankle jerk 2+/2+.     Sensory: Intact and symmetric to light touch in all extremities     Cerebellar/Coordination/Gait:Gait exam demonstrates normal posture, base, stride length, arm swing and turns.          Diagnostic Studies and Review of Records   Labs:  All Labs Last 24hrs: No results found for this or any previous visit (from the past 24 hour(s)).    Imaging:  MRI brian personally reviewed:  IMPRESSION:  -Interval decrease in size of enhancing lesions in the right centrum semiovale and right parietal lobe. Additionally, there has been a slight interval decrease in surrounding vasogenic edema. No new enhancing lesions.              Specimen Collected: 09/04/21 21:46

## 2022-02-05 ENCOUNTER — Ambulatory Visit: Admit: 2022-02-05 | Discharge: 2022-02-06 | Payer: PRIVATE HEALTH INSURANCE

## 2022-02-05 NOTE — Unmapped (Addendum)
I am glad to see that you have been seizure free and that your cognition has improved since restarting the memantine. For this reason we will keep you on your current does of memantine. This medication cross reacts with alcohol and for this reason you should not drink alcohol.     Even though you have been seizure free for several months, we want to be on the safe side and keep you on the keppra for a bit longer. Please continue to take your keppra at your current dose. Also you cannot drive for at least 6 months from your seizure, per [place] state law.      Please reach out if you have any questions or concerns. Otherwise I plan on seeing you in 6 months

## 2022-02-07 NOTE — Unmapped (Signed)
Called to discuss CT results - COPD (bronchial wall thickening, and emphysematous changes) and scarring at bases from prior pneumonias, no clear pulmonary fibrosis   Awaiting lung function testing to be scheduled - gave clinic number to call  Is feeling a bit better since starting inhaler, coughing less  All questions answered

## 2022-02-10 NOTE — Unmapped (Signed)
ATTESTATION NOTE:  I discussed the patient's case with the resident. I reviewed the resident???s note and agree with the resident???s findings and plan as documented in their note.   Tawanna Cooler, MD

## 2022-02-17 NOTE — Unmapped (Signed)
[facility] Clinical Assessment & Refill Coordination Note    Andre Duncan, DOB: [birth year]  Phone: [phone] (home)     All above HIPAA information was verified with patient.     Was a Nurse, learning disability used for this call? No    Specialty Medication(s):   Hematology/Oncology: Imbruvica     Current Outpatient Medications   Medication Sig Dispense Refill    albuterol HFA 90 mcg/actuation inhaler Inhale 2-4 puffs every four (4) hours as needed for wheezing. With Spacer 18 g 3    cetirizine (ZYRTEC) 10 MG tablet Take 1 tablet (10 mg total) by mouth daily as needed.      cholecalciferol, vitamin D3-50 mcg, 2,000 unit,, 50 mcg (2,000 unit) tablet Take 1 tablet (50 mcg total) by mouth daily.      escitalopram oxalate (LEXAPRO) 20 MG tablet Take 1 tablet (20 mg total) by mouth daily. 90 tablet 2    finasteride (PROSCAR) 5 mg tablet Take 1 tablet (5 mg total) by mouth daily.      fish oil/borage/flax/om3,6,9 1 (OMEGA 3-6-9 COMPLEX ORAL) Take 1 capsule by mouth daily.      fluoride, sodium, 1.1 % Gel Brush teeth with a pea-sized amount of the paste for 2 min and spit.  No rinsing. Do not eat or drink for 30 minutes after use. Use 2x/day. 56 g prn    glycopyrrolate-formoteroL (BEVESPI AEROSPHERE) 9-4.8 mcg HFAA inhaler Inhale 2 puffs Two (2) times a day. Use with spacer. 10.7 g 6    ibrutinib (IMBRUVICA) 560 mg tablet Take 1 tablet (560 mg total) by mouth daily. 28 tablet 11    levETIRAcetam (KEPPRA) 500 MG tablet Take 1 tablet (500 mg total) by mouth Two (2) times a day. 60 tablet 4    memantine (NAMENDA) 10 MG tablet Take 1 tablet (10 mg total) by mouth Two (2) times a day. 60 tablet 5    metFORMIN (GLUCOPHAGE) 500 MG tablet Take 2 tablets (1,000 mg total) by mouth in the morning and 2 tablets (1,000 mg total) in the evening. Take with meals. 180 tablet 3    mirtazapine (REMERON) 30 MG tablet Take 1 tablet (30 mg total) by mouth nightly. 90 tablet 1    multivitamin (TAB-A-VITE/THERAGRAN) per tablet Take 1 tablet by mouth daily.      OLANZapine (ZYPREXA) 5 MG tablet Take 1 tablet (5 mg total) by mouth nightly. 90 tablet 1    pantoprazole (PROTONIX) 40 MG tablet Take 1 tablet (40 mg total) by mouth daily. 30 tablet 1     No current facility-administered medications for this visit.        Changes to medications: Eliseo reports no changes at this time.    Allergies   Allergen Reactions    Sulfa (Sulfonamide Antibiotics) Rash     Developed during hospitalization d/c     Tegaderm Adhesive-No Drug-Allergy Check Other (See Comments)       Changes to allergies: No    SPECIALTY MEDICATION ADHERENCE     Imbruvica 560 mg: 8-9 days of medicine on hand     Medication Adherence    Patient reported X missed doses in the last month: 0  Specialty Medication: Imbruvica 560 mg - 1 tablet (560 mg) once daily  Patient is on additional specialty medications: No  Informant: spouse  Confirmed plan for next specialty medication refill: delivery by pharmacy  Refills needed for supportive medications: not needed  Specialty medication(s) dose(s) confirmed: Regimen is correct and unchanged.     Are there any concerns with adherence? No    Adherence counseling provided? Not needed    CLINICAL MANAGEMENT AND INTERVENTION      Clinical Benefit Assessment:    Do you feel the medicine is effective or helping your condition? Yes    Clinical Benefit counseling provided? Not needed    Adverse Effects Assessment:    Are you experiencing any side effects? Yes, patient reports experiencing some fatigue and decreased appetite. Side effect counseling provided: NA - is managing at this time.  Wife will add protein powder to provide extra calories.  Is increasing high density/nutritional foods like avocados, cheese, etc...    Are you experiencing difficulty administering your medicine? No    Quality of Life Assessment:    Quality of Life    Rheumatology  Oncology  2. On a scale of 1-10, how would you rate your ability to manage side effects associated with your specialty medication? (1=no issues, 10 = unable to take medication due to side effects): 1  Dermatology  Cystic Fibrosis          How many days over the past month did your CNS Lymphoma  keep you from your normal activities? For example, brushing your teeth or getting up in the morning. Is able to complete his activities but needs to rest/sleep in between    Have you discussed this with your provider? Yes    Acute Infection Status:    Acute infections noted within Epic:  No active infections  Patient reported infection: None    Therapy Appropriateness:    Is therapy appropriate and patient progressing towards therapeutic goals? Yes, therapy is appropriate and should be continued    DISEASE/MEDICATION-SPECIFIC INFORMATION      N/A    PATIENT SPECIFIC NEEDS     Does the patient have any physical, cognitive, or cultural barriers? No    Is the patient high risk? Yes, patient is taking oral chemotherapy. Appropriateness of therapy as been assessed    Does the patient require a Care Management Plan? No     SOCIAL DETERMINANTS OF HEALTH     At the [facility], we have learned that life circumstances - like trouble affording food, housing, utilities, or transportation can affect the health of many of our patients.   That is why we wanted to ask: are you currently experiencing any life circumstances that are negatively impacting your health and/or quality of life? No    Social Determinants of Psychologist, prison and probation services Strain: Not on file   Internet Connectivity: Not on file   Food Insecurity: Not on file   Tobacco Use: High Risk    Smoking Tobacco Use: Every Day    Smokeless Tobacco Use: Never    Passive Exposure: Not on file   Housing/Utilities: Not on file   Alcohol Use: Not At Risk    How often do you have a drink containing alcohol?: Monthly or less    How many drinks containing alcohol do you have on a typical day when you are drinking?: 1 - 2    How often do you have 5 or more drinks on one occasion?: Never   Transportation Needs: Not on file   Substance Use: Low Risk     Taken prescription drugs for non-medical reasons: Never    Taken illegal drugs: Never    Patient indicated they have taken drugs in  the past year for non-medical reasons: Yes, [positive answer(s)]: Not on file   Health Literacy: Not on file   Physical Activity: Not on file   Interpersonal Safety: Not on file   Stress: Not on file   Intimate Partner Violence: Not on file   Depression: Not at risk    PHQ-2 Score: 0   Social Connections: Not on file       Would you be willing to receive help with any of the needs that you have identified today? Not applicable       SHIPPING     Specialty Medication(s) to be Shipped:   Hematology/Oncology: Imbruvica    Other medication(s) to be shipped: No additional medications requested for fill at this time     Changes to insurance: No    Delivery Scheduled: Yes, Expected medication delivery date: 02/21/22.     Medication will be delivered via UPS to the confirmed prescription address in [facility].    The patient will receive a drug information handout for each medication shipped and additional FDA Medication Guides as required.  Verified that patient has previously received a Conservation officer, historic buildings and a Surveyor, mining.    The patient or caregiver noted above participated in the development of this care plan and knows that they can request review of or adjustments to the care plan at any time.      All of the patient's questions and concerns have been addressed.    Breck Coons [facility] Specialty Pharmacist

## 2022-02-20 MED FILL — IMBRUVICA 560 MG TABLET: ORAL | 28 days supply | Qty: 28 | Fill #6

## 2022-03-10 DIAGNOSIS — C8330 Diffuse large B-cell lymphoma, unspecified site: Principal | ICD-10-CM

## 2022-03-13 ENCOUNTER — Ambulatory Visit: Admit: 2022-03-13 | Discharge: 2022-03-13 | Payer: PRIVATE HEALTH INSURANCE

## 2022-03-13 ENCOUNTER — Ambulatory Visit
Admit: 2022-03-13 | Discharge: 2022-03-14 | Payer: PRIVATE HEALTH INSURANCE | Attending: Radiation Oncology | Primary: Radiation Oncology

## 2022-03-13 ENCOUNTER — Ambulatory Visit: Admit: 2022-03-13 | Payer: PRIVATE HEALTH INSURANCE | Attending: Radiation Oncology | Primary: Radiation Oncology

## 2022-03-13 LAB — CBC W/ AUTO DIFF
BASOPHILS ABSOLUTE COUNT: 0.1 10*9/L (ref 0.0–0.1)
BASOPHILS RELATIVE PERCENT: 0.8 %
EOSINOPHILS ABSOLUTE COUNT: 0.1 10*9/L (ref 0.0–0.5)
EOSINOPHILS RELATIVE PERCENT: 0.7 %
HEMATOCRIT: 48.9 % — ABNORMAL HIGH (ref 39.0–48.0)
HEMOGLOBIN: 16.8 g/dL — ABNORMAL HIGH (ref 12.9–16.5)
LYMPHOCYTES ABSOLUTE COUNT: 3.2 10*9/L (ref 1.1–3.6)
LYMPHOCYTES RELATIVE PERCENT: 27.1 %
MEAN CORPUSCULAR HEMOGLOBIN CONC: 34.4 g/dL (ref 32.0–36.0)
MEAN CORPUSCULAR HEMOGLOBIN: 32 pg (ref 25.9–32.4)
MEAN CORPUSCULAR VOLUME: 93 fL (ref 77.6–95.7)
MEAN PLATELET VOLUME: 8.3 fL (ref 6.8–10.7)
MONOCYTES ABSOLUTE COUNT: 0.9 10*9/L — ABNORMAL HIGH (ref 0.3–0.8)
MONOCYTES RELATIVE PERCENT: 7.8 %
NEUTROPHILS ABSOLUTE COUNT: 7.5 10*9/L (ref 1.8–7.8)
NEUTROPHILS RELATIVE PERCENT: 63.6 %
NUCLEATED RED BLOOD CELLS: 0 /100{WBCs} (ref <=4)
PLATELET COUNT: 227 10*9/L (ref 150–450)
RED BLOOD CELL COUNT: 5.26 10*12/L (ref 4.26–5.60)
RED CELL DISTRIBUTION WIDTH: 14.2 % (ref 12.2–15.2)
WBC ADJUSTED: 11.9 10*9/L — ABNORMAL HIGH (ref 3.6–11.2)

## 2022-03-13 LAB — COMPREHENSIVE METABOLIC PANEL
ALBUMIN: 4.2 g/dL (ref 3.4–5.0)
ALKALINE PHOSPHATASE: 57 U/L (ref 46–116)
ALT (SGPT): 11 U/L (ref 10–49)
ANION GAP: 7 mmol/L (ref 5–14)
AST (SGOT): 17 U/L (ref <=34)
BILIRUBIN TOTAL: 0.5 mg/dL (ref 0.3–1.2)
BLOOD UREA NITROGEN: 11 mg/dL (ref 9–23)
BUN / CREAT RATIO: 15
CALCIUM: 9.7 mg/dL (ref 8.7–10.4)
CHLORIDE: 109 mmol/L — ABNORMAL HIGH (ref 98–107)
CO2: 24.2 mmol/L (ref 20.0–31.0)
CREATININE: 0.73 mg/dL
EGFR CKD-EPI (2021) MALE: >90 mL/min/{1.73_m2} (ref >=60)
GLUCOSE RANDOM: 126 mg/dL (ref 70–179)
POTASSIUM: 3.8 mmol/L (ref 3.4–4.8)
PROTEIN TOTAL: 7.3 g/dL (ref 5.7–8.2)
SODIUM: 140 mmol/L (ref 135–145)

## 2022-03-13 MED ADMIN — gadobenate dimeglumine (MULTIHANCE) 529 mg/mL (0.1mmol/0.2mL) solution 15 mL: 15 mL | INTRAVENOUS | @ 12:00:00 | Stop: 2022-03-13

## 2022-03-13 NOTE — Unmapped (Addendum)
RADIATION ONCOLOGY FOLLOW UP NOTE     Encounter Date: 03/13/2022  Patient Name: Andre Duncan  Medical Record Number: [id number]    Referring Physician: Dr. 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 Elmon Kirschner today   We will see him again in 3 months with brain MRI  Off steroids  Continue memantine  Currently on Ibrutinib     --------------------------    Interval History:  Pt is seen with his wife. Memantine still helping with cognitive function. He continues to take ibrutinib. No new neurologic symptoms, no seizures, no headaches.    MRI today without evidence of recurrent disease         Oncology History   Primary CNS lymphoma (CMS-HCC)   11/28/2020 Initial Diagnosis    Primary CNS lymphoma (CMS-HCC)     11/30/2020 - 11/30/2020 Chemotherapy    IP LYMPHOMA HIGH-DOSE METHOTREXATE  riTUXimab 375 mg/m2 (standard and rapid infusion) IV on day 1, vinCRIStine 1.4 mg/m2 (max 2 mg) IV on day 1, DOXOrubicin 50 mg/m2 IV on day 1, cyclophosphamide 750 mg/m2 IV on day 1, predniSONE 100 mg PO on days 1-5, methotrexate (high-dose) IV on Day 15 (even cycles), every 21-day.     01/03/2021 -  Radiation    Radiation Therapy Treatment Details (Noted on 01/03/2021)  Site: Midline Brain  Technique: 3D CRT  Goal: No goal specified  Planned Treatment Start Date: No planned start date specified     08/23/2021 -  Radiation    Radiation Therapy Treatment Details (Noted on 08/23/2021)  Site: Bilateral Brain  Technique: SRS  Goal: No goal specified  Planned Treatment Start Date: No planned start date specified       ROS:  A 10 systems was negative except for pertinent positives noted in HPI.    Physical Examination:  VITAL SIGNS: Temp 36.1 ??C (97 ??F) (Temporal) - Wt 85.9 kg (189 lb 4.8 oz)  - BMI 25.08 kg/m??   CONSTITUTIONAL:   Engaged middle aged male  EYES:  Sclera anicteric, EOMI,   Ears, Nose, Mouth, Throat: Facial movements full and symmetric, tongue midline. No visual field deficits.   NECK:  Supple   CV:  Regular rate   RESPIRATORY: Breathing comfortably   MUSCULOSKELETAL: Independent gait, no assistive devices.  5/5 upper and lower  PSYCHIATRIC:  Mood is normal.    EXTREMITIES:     No edema  NEUROLOGIC:  Oriented x 3. CN II-XII grossly intact.      Imaging:    MRI brain reviewed today   Formal read:  -No residual enhancement or evidence of CNS lymphoma within the brain.       -Stable confluent T2/FLAIR signal normality within the white matter, predominantly involving the frontoparietal lobes and likely due to radiation therapy.         Electronically signed by:  Blythe Stanford, MD  Radiation Oncology Resident, PGY-4  [facility]  Pager: [phone]      -I saw and evaluated/examined the patient, participating in the key portions of the service.  I discussed the findings, assessment, and plan of care with the resident/APP. I personally reviewed all relevant data associated with this encounter including imaging,  labwork, and prior records. I have edited and agree with the findings and plan as documented in the resident/APP's note.  Dana L. Baird Lyons, MD  Assistant Professor  Department of Radiation Oncology

## 2022-03-13 NOTE — Unmapped (Signed)
03/13/2022      Subjective/Assessment/Recommendations:    1. Fatigue: Mild  2. Chemo: Not administered  3. Pain: Denies pain  4. Neurologic status: Vision  5. Psychosocial: No issues, Has home support, and Accompanied by family member/other today  6. Other: MRI and labs today     ZO:XWRUE Brai: 02/04/2021: 2,340/2,340 cGy  AV:WUJWJXBJY: 09/06/2021: 2,500/2,500 cGy    Wt Readings from Last 6 Encounters:   03/13/22 85.9 kg (189 lb 4.8 oz)   02/04/22 85.9 kg (189 lb 6 oz)   01/15/22 86.2 kg (190 lb)   01/09/22 88.2 kg (194 lb 8 oz)   12/05/21 86.2 kg (190 lb 0.6 oz)   11/14/21 88 kg (194 lb)

## 2022-03-13 NOTE — Unmapped (Signed)
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  Regimen: MATRIX (Methotrexate, cytarabine, thiotepa, rituximab): started 06/07/20-08/10/20 for 4 cycles --> pr-->single agent methotrexate x 3 cycles --> further decrease in size of masses   Not a transplant candidate due to poor PFTs and active smoking   Whole brain radiation 2340cGy given at 180cGy x 13 from 01/17/21-02/04/21 --> CR  Relapse - progression seen on routine MRI scan in new sites 08/15/2021 , seizure like activity noted 12/1 and keppra started  Regimen #2:  5 fractions of Cyberknife to involved areas (2500cGy) completed 09/06/21  Ibrutinib started 09/06/21--  MRI brain 11/06/21 - significantly improved   MRI 01/09/22 and 03/12/22: No residual enhancing lesions or disease progression; stable T2/flair- may represent posttreatment changes and superimposed chronic small vessel ischemic disease.     CNS lymphoma:  He received cyberknife as a palliative measure to decrease the known masses as quickly as possible. This is largely a palliative measure, particularly since it is likely that he will develop other lesions in the brain without alternative therapy. Therefore, he was started on treatment with ibrutinib.  There is data for ibrutinib, a first generation BTKi (Soussain et al. Eur J Conacer 2019).  A phase II study of 44 evaluable patients with rel/ref primary CNS DLBCL demonstrated that after 2 months of treatment the disease control rate was 70% with a CR in 19%, PR in 33% and stable disease in 10%.  After a median follow up of 25.7 months, the median PFS was 48 months and overall survival was 19.2 months.  13/44 patients received ibrutinib for over 12 months.  Ibrutinib was detectable in the CSF fluid.  Two patients developed pulmonary aspergillosis. NCCN recommendations include ibrutinib.     He has previously met with transplant. With the poor lung function tests Dr. Sheryn Bison still thought the risk fo transplant outweighed the benefit. The patient has an upcoming appt with pulmonology.     Today's Summary:   His MRI scan continues to show no evidence of disease, which is encouraging. He is tolerating treatment with ibrutinib well. We will continue him on ibrutinib indefinitely as long as it is well tolerated.  Labs are adequate to continue on his current regimen - his CBC is overall normal.    To do:  1. Continue ibrutinib at current dose - this is a high risk medication so ongoing monitoring is required with labs nd exam  2.  Return in 6 weeks with repeat labs cbc and cmp to see me or Lorella Nimrod.  3. MRI in three months    Clarene Critchley, MD  Associate Professor  Division of Hematology      ============  Interval Hx:    He is doing well. No confusion. No seizure activity. No seizure activity. No palpitations.  No bleeding. No fatigue.  Brain MRI scan reviewed and there is NED.  Rad Onc note reviewed and there is a plan to repeat MRI in three months.     MEDICATIONS:  Current Outpatient Medications   Medication Sig Dispense Refill    albuterol HFA 90 mcg/actuation inhaler Inhale 2-4 puffs every four (4) hours as needed for wheezing. With Spacer 18 g 3    cetirizine (ZYRTEC) 10 MG tablet Take 1 tablet (10 mg total) by mouth daily as needed.      cholecalciferol, vitamin D3-50 mcg, 2,000 unit,, 50 mcg (2,000 unit) tablet Take 1 tablet (50 mcg total) by mouth daily.      escitalopram  oxalate (LEXAPRO) 20 MG tablet Take 1 tablet (20 mg total) by mouth daily. 90 tablet 2    finasteride (PROSCAR) 5 mg tablet Take 1 tablet (5 mg total) by mouth daily.      fish oil/borage/flax/om3,6,9 1 (OMEGA 3-6-9 COMPLEX ORAL) Take 1 capsule by mouth daily.      fluoride, sodium, 1.1 % Gel Brush teeth with a pea-sized amount of the paste for 2 min and spit.  No rinsing. Do not eat or drink for 30 minutes after use. Use 2x/day. 56 g prn    glycopyrrolate-formoteroL (BEVESPI AEROSPHERE) 9-4.8 mcg HFAA inhaler Inhale 2 puffs Two (2) times a day. Use with spacer. 10.7 g 6    ibrutinib (IMBRUVICA) 560 mg tablet Take 1 tablet (560 mg total) by mouth daily. 28 tablet 11    levETIRAcetam (KEPPRA) 500 MG tablet Take 1 tablet (500 mg total) by mouth Two (2) times a day. 60 tablet 4    memantine (NAMENDA) 10 MG tablet Take 1 tablet (10 mg total) by mouth Two (2) times a day. 60 tablet 5    metFORMIN (GLUCOPHAGE) 500 MG tablet Take 2 tablets (1,000 mg total) by mouth in the morning and 2 tablets (1,000 mg total) in the evening. Take with meals. 180 tablet 3    mirtazapine (REMERON) 30 MG tablet Take 1 tablet (30 mg total) by mouth nightly. 90 tablet 1    multivitamin (TAB-A-VITE/THERAGRAN) per tablet Take 1 tablet by mouth daily.      OLANZapine (ZYPREXA) 5 MG tablet Take 1 tablet (5 mg total) by mouth nightly. 90 tablet 1    pantoprazole (PROTONIX) 40 MG tablet Take 1 tablet (40 mg total) by mouth daily. 30 tablet 1     No current facility-administered medications for this visit.         VITAL SIGNS:   Vitals:    03/13/22 1148   Resp: (P) 17   TempSrc: (P) Temporal   Weight: (P) 85.8 kg (189 lb 2.5 oz)       Physical Exam  Constitutional:       General: He is not in acute distress.  Eyes:      General: No scleral icterus.     Extraocular Movements: Extraocular movements intact.      Pupils: Pupils are equal, round, and reactive to light.   Cardiovascular:      Rate and Rhythm: Regular rhythm.      Heart sounds: Normal heart sounds.   Pulmonary:      Effort: Pulmonary effort is normal.      Breath sounds: Normal breath sounds.   Abdominal:      Palpations: Abdomen is soft.      Tenderness: There is no abdominal tenderness.   Musculoskeletal:         General: No swelling.      Comments: Able to position to exam table independently.  Interactive during visit.    Skin:     General: Skin is warm and dry.   Neurological:      General: No focal deficit present.      Mental Status: He is alert.   Psychiatric:         Mood and Affect: Mood normal.         Behavior: Behavior normal. LABORATORY:  Appointment on 03/13/2022   Component Date Value Ref Range Status    Sodium 03/13/2022 140  135 - 145 mmol/L Final    Potassium 03/13/2022 3.8  3.4 - 4.8  mmol/L Final    Chloride 03/13/2022 109 (H)  98 - 107 mmol/L Final    CO2 03/13/2022 24.2  20.0 - 31.0 mmol/L Final    Anion Gap 03/13/2022 7  5 - 14 mmol/L Final    BUN 03/13/2022 11  9 - 23 mg/dL Final    Creatinine 16/07/9603 0.73  0.60 - 1.10 mg/dL Final    BUN/Creatinine Ratio 03/13/2022 15   Final    eGFR CKD-EPI (2021) Male 03/13/2022 >90  >=60 mL/min/1.70m2 Final    eGFR calculated with CKD-EPI 2021 equation in accordance with [employer] and [employer] of Nephrology Task Force recommendations.    Glucose 03/13/2022 126  70 - 179 mg/dL Final    Calcium 54/06/8118 9.7  8.7 - 10.4 mg/dL Final    Albumin 14/78/2956 4.2  3.4 - 5.0 g/dL Final    Total Protein 03/13/2022 7.3  5.7 - 8.2 g/dL Final    Total Bilirubin 03/13/2022 0.5  0.3 - 1.2 mg/dL Final    AST 21/30/8657 17  <=34 U/L Final    ALT 03/13/2022 11  10 - 49 U/L Final    Alkaline Phosphatase 03/13/2022 57  46 - 116 U/L Final    WBC 03/13/2022 11.9 (H)  3.6 - 11.2 10*9/L Final    RBC 03/13/2022 5.26  4.26 - 5.60 10*12/L Final    HGB 03/13/2022 16.8 (H)  12.9 - 16.5 g/dL Final    HCT 84/69/6295 48.9 (H)  39.0 - 48.0 % Final    MCV 03/13/2022 93.0  77.6 - 95.7 fL Final    MCH 03/13/2022 32.0  25.9 - 32.4 pg Final    MCHC 03/13/2022 34.4  32.0 - 36.0 g/dL Final    RDW 28/41/3244 14.2  12.2 - 15.2 % Final    MPV 03/13/2022 8.3  6.8 - 10.7 fL Final    Platelet 03/13/2022 227  150 - 450 10*9/L Final    nRBC 03/13/2022 0  <=4 /100 WBCs Final    Neutrophils % 03/13/2022 63.6  % Final    Lymphocytes % 03/13/2022 27.1  % Final    Monocytes % 03/13/2022 7.8  % Final    Eosinophils % 03/13/2022 0.7  % Final    Basophils % 03/13/2022 0.8  % Final    Absolute Neutrophils 03/13/2022 7.5  1.8 - 7.8 10*9/L Final    Absolute Lymphocytes 03/13/2022 3.2  1.1 - 3.6 10*9/L Final Absolute Monocytes 03/13/2022 0.9 (H)  0.3 - 0.8 10*9/L Final    Absolute Eosinophils 03/13/2022 0.1  0.0 - 0.5 10*9/L Final    Absolute Basophils 03/13/2022 0.1  0.0 - 0.1 10*9/L Final

## 2022-03-19 NOTE — Unmapped (Signed)
[facility] Refill Coordination Note    Specialty Medication(s) to be Shipped:   Hematology/Oncology: Imbruvica    Other medication(s) to be shipped: No additional medications requested for fill at this time     Andre Duncan, DOB: [birth year]  Phone: [phone] (home)       All above HIPAA information was verified with patient's family member, Andre Duncan.     Was a Nurse, learning disability used for this call? No    Completed refill call assessment today to schedule patient's medication shipment from the [facility] [phone]).  All relevant notes have been reviewed.     Specialty medication(s) and dose(s) confirmed: Regimen is correct and unchanged.   Changes to medications: Karnell reports no changes at this time.  Changes to insurance: No  New side effects reported not previously addressed with a pharmacist or physician: None reported  Questions for the pharmacist: No    Confirmed patient received a Conservation officer, historic buildings and a Surveyor, mining with first shipment. The patient will receive a drug information handout for each medication shipped and additional FDA Medication Guides as required.       DISEASE/MEDICATION-SPECIFIC INFORMATION        N/A    SPECIALTY MEDICATION ADHERENCE     Medication Adherence    Patient reported X missed doses in the last month: 0  Specialty Medication: Imbruvica 560 mg  Patient is on additional specialty medications: No  Patient is on more than two specialty medications: No  Any gaps in refill history greater than 2 weeks in the last 3 months: no  Demonstrates understanding of importance of adherence: yes  Informant: significant other  Reliability of informant: reliable  Provider-estimated medication adherence level: good  Patient is at risk for Non-Adherence: No  Reasons for non-adherence: no problems identified  Confirmed plan for next specialty medication refill: delivery by pharmacy  Refills needed for supportive medications: not needed Refill Coordination    Has the Patients' Contact Information Changed: No  Is the Shipping Address Different: No         Were doses missed due to medication being on hold? No    imbruvica 560 mg: 7 days of medicine on hand         REFERRAL TO PHARMACIST     Referral to the pharmacist: Not needed      [facility]     Shipping address confirmed in Epic.     Delivery Scheduled: Yes, Expected medication delivery date: 06/16.     Medication will be delivered via Next Day Courier to the prescription address in Epic WAM.    Antonietta Barcelona   [facility] Specialty Technician

## 2022-03-20 DIAGNOSIS — C8589 Other specified types of non-Hodgkin lymphoma, extranodal and solid organ sites: Principal | ICD-10-CM

## 2022-03-20 MED ORDER — IBRUTINIB 280 MG TABLET
ORAL_TABLET | Freq: Every day | ORAL | 11 refills | 28 days | Status: CP
Start: 2022-03-20 — End: 2023-03-15
  Filled 2022-03-25: qty 56, 28d supply, fill #0

## 2022-03-20 NOTE — Unmapped (Signed)
Ziyon Barrone 's Imbruvica 560 mg shipment will be delayed as a result of a new dose for the medication has been received. The 560 mg strength formulation is not available and his provider has approved changing to 280 mg tablets - 2 tablets once daily.    I have reached out to the patient in addition to the clinic  at [phone] and communicated the delivery change. We will wait for a call back from the patient to reschedule the delivery.  We have not confirmed the new delivery date.      [name], PharmD  [facility]

## 2022-03-24 NOTE — Unmapped (Signed)
[facility] Refill Coordination Note    Specialty Medication(s) to be Shipped:   Hematology/Oncology: Ibrutinib 280mg  Tab   Other medication(s) to be shipped: No additional medications requested for fill at this time     Andre Duncan, DOB: [birth year]  Phone: [phone] (home)     All above HIPAA information was verified with patient's family member, Wife, Andre Duncan.     Was a Nurse, learning disability used for this call? No    Completed refill call assessment today to schedule patient's medication shipment from the [facility] [phone]).  All relevant notes have been reviewed.     Specialty medication(s) and dose(s) confirmed: Regimen is correct and unchanged.   Changes to medications: Andre Duncan reports no changes at this time.  Changes to insurance: No  New side effects reported not previously addressed with a pharmacist or physician: None reported  Questions for the pharmacist: No    Confirmed patient received a Conservation officer, historic buildings and a Surveyor, mining with first shipment. The patient will receive a drug information handout for each medication shipped and additional FDA Medication Guides as required.       DISEASE/MEDICATION-SPECIFIC INFORMATION        N/A    SPECIALTY MEDICATION ADHERENCE     Medication Adherence    Patient reported X missed doses in the last month: 0  Specialty Medication: Ibrutinib 280mg  Tab  Patient is on additional specialty medications: No  Patient is on more than two specialty medications: No  Informant: spouse  Reliability of informant: reliable  Reasons for non-adherence: no problems identified        Were doses missed due to medication being on hold? No    Ibrutinib 280mg  Tab: 1 days of medicine on hand     REFERRAL TO PHARMACIST     Referral to the pharmacist: Not needed    [facility]     Shipping address confirmed in Epic.     Delivery Scheduled: Yes, Expected medication delivery date: 03/25/2022.     Medication will be delivered via Same Day Courier to the prescription address in Epic WAM.    Andre Duncan   [facility] Specialty Technician

## 2022-04-02 ENCOUNTER — Ambulatory Visit: Payer: 59 | Admitting: Urology

## 2022-04-10 NOTE — Unmapped (Signed)
[facility] Refill Coordination Note    Specialty Medication(s) to be Shipped:   Hematology/Oncology: Imbruvica    Other medication(s) to be shipped: No additional medications requested for fill at this time     Andre Duncan, DOB: July 16, 1966  Phone: [phone] (home)       All above HIPAA information was verified with patient's family member, Andre Duncan.     Was a Nurse, learning disability used for this call? No    Completed refill call assessment today to schedule patient's medication shipment from the [facility] [phone]).  All relevant notes have been reviewed.     Specialty medication(s) and dose(s) confirmed: Regimen is correct and unchanged.   Changes to medications: Andre Duncan reports no changes at this time.  Changes to insurance: No  New side effects reported not previously addressed with a pharmacist or physician: None reported  Questions for the pharmacist: No    Confirmed patient received a Conservation officer, historic buildings and a Surveyor, mining with first shipment. The patient will receive a drug information handout for each medication shipped and additional FDA Medication Guides as required.       DISEASE/MEDICATION-SPECIFIC INFORMATION        N/A    SPECIALTY MEDICATION ADHERENCE     Medication Adherence    Patient reported X missed doses in the last month: 0  Specialty Medication: Imbruvica 280mg   Patient is on additional specialty medications: No  Informant: spouse          Were doses missed due to medication being on hold? No    Imbruvica 280 mg: 7 days of medicine on hand         REFERRAL TO PHARMACIST     Referral to the pharmacist: Not needed      [facility]     Shipping address confirmed in Epic.     Delivery Scheduled: Yes, Expected medication delivery date: 04/19/22.     Medication will be delivered via Next Day Courier to the prescription address in Epic [place].    Andre Duncan Andre Duncan   [facility] Specialty Technician

## 2022-04-18 MED FILL — IMBRUVICA 280 MG TABLET: ORAL | 28 days supply | Qty: 56 | Fill #1

## 2022-04-21 NOTE — Unmapped (Signed)
Spoke with pt's wife about moving scheduled appt out a week; that was ok with pt so we moved appts to 7/27.

## 2022-05-01 ENCOUNTER — Ambulatory Visit: Admit: 2022-05-01 | Discharge: 2022-05-02 | Payer: PRIVATE HEALTH INSURANCE

## 2022-05-01 ENCOUNTER — Other Ambulatory Visit: Admit: 2022-05-01 | Discharge: 2022-05-02 | Payer: PRIVATE HEALTH INSURANCE

## 2022-05-01 DIAGNOSIS — W5501XA Bitten by cat, initial encounter: Principal | ICD-10-CM

## 2022-05-01 DIAGNOSIS — C8589 Other specified types of non-Hodgkin lymphoma, extranodal and solid organ sites: Principal | ICD-10-CM

## 2022-05-01 DIAGNOSIS — C8330 Diffuse large B-cell lymphoma, unspecified site: Principal | ICD-10-CM

## 2022-05-01 DIAGNOSIS — E1369 Other specified diabetes mellitus with other specified complication: Principal | ICD-10-CM

## 2022-05-01 LAB — CBC W/ AUTO DIFF
BASOPHILS ABSOLUTE COUNT: 0 10*9/L (ref 0.0–0.1)
BASOPHILS RELATIVE PERCENT: 0.4 %
EOSINOPHILS ABSOLUTE COUNT: 0 10*9/L (ref 0.0–0.5)
EOSINOPHILS RELATIVE PERCENT: 0.3 %
HEMATOCRIT: 46.1 % (ref 39.0–48.0)
HEMOGLOBIN: 16 g/dL (ref 12.9–16.5)
LYMPHOCYTES ABSOLUTE COUNT: 2.1 10*9/L (ref 1.1–3.6)
LYMPHOCYTES RELATIVE PERCENT: 18.9 %
MEAN CORPUSCULAR HEMOGLOBIN CONC: 34.8 g/dL (ref 32.0–36.0)
MEAN CORPUSCULAR HEMOGLOBIN: 31.9 pg (ref 25.9–32.4)
MEAN CORPUSCULAR VOLUME: 91.5 fL (ref 77.6–95.7)
MEAN PLATELET VOLUME: 8.2 fL (ref 6.8–10.7)
MONOCYTES ABSOLUTE COUNT: 0.8 10*9/L (ref 0.3–0.8)
MONOCYTES RELATIVE PERCENT: 7.1 %
NEUTROPHILS ABSOLUTE COUNT: 8.2 10*9/L — ABNORMAL HIGH (ref 1.8–7.8)
NEUTROPHILS RELATIVE PERCENT: 73.3 %
PLATELET COUNT: 198 10*9/L (ref 150–450)
RED BLOOD CELL COUNT: 5.04 10*12/L (ref 4.26–5.60)
RED CELL DISTRIBUTION WIDTH: 14.5 % (ref 12.2–15.2)
WBC ADJUSTED: 11.1 10*9/L (ref 3.6–11.2)

## 2022-05-01 LAB — COMPREHENSIVE METABOLIC PANEL
ALBUMIN: 4 g/dL (ref 3.4–5.0)
ALKALINE PHOSPHATASE: 59 U/L (ref 46–116)
ALT (SGPT): 12 U/L (ref 10–49)
ANION GAP: 7 mmol/L (ref 5–14)
AST (SGOT): 11 U/L (ref <=34)
BILIRUBIN TOTAL: 0.8 mg/dL (ref 0.3–1.2)
BLOOD UREA NITROGEN: 10 mg/dL (ref 9–23)
BUN / CREAT RATIO: 14
CALCIUM: 8.6 mg/dL — ABNORMAL LOW (ref 8.7–10.4)
CHLORIDE: 108 mmol/L — ABNORMAL HIGH (ref 98–107)
CO2: 23 mmol/L (ref 20.0–31.0)
CREATININE: 0.73 mg/dL
EGFR CKD-EPI (2021) MALE: >90 mL/min/{1.73_m2} (ref >=60)
GLUCOSE RANDOM: 152 mg/dL (ref 70–179)
POTASSIUM: 4.1 mmol/L (ref 3.4–4.8)
PROTEIN TOTAL: 6.8 g/dL (ref 5.7–8.2)
SODIUM: 138 mmol/L (ref 135–145)

## 2022-05-01 LAB — HEMOGLOBIN A1C
ESTIMATED AVERAGE GLUCOSE: 105 mg/dL
HEMOGLOBIN A1C: 5.3 % (ref 4.8–5.6)

## 2022-05-01 MED ORDER — DRONABINOL 2.5 MG CAPSULE
ORAL_CAPSULE | Freq: Two times a day (BID) | ORAL | 0 refills | 30.00000 days | Status: CP
Start: 2022-05-01 — End: 2022-05-01

## 2022-05-01 MED ORDER — ESCITALOPRAM 20 MG TABLET
ORAL_TABLET | Freq: Every day | ORAL | 2 refills | 90 days | Status: CP
Start: 2022-05-01 — End: ?

## 2022-05-01 MED ORDER — AMOXICILLIN 875 MG-POTASSIUM CLAVULANATE 125 MG TABLET
ORAL_TABLET | Freq: Two times a day (BID) | ORAL | 0 refills | 10 days | Status: CP
Start: 2022-05-01 — End: 2022-05-11

## 2022-05-01 NOTE — Unmapped (Signed)
Peripheral stick done by Eduard Roux, 23g, labs drawn and sent.

## 2022-05-01 NOTE — Unmapped (Signed)
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  Regimen: MATRIX (Methotrexate, cytarabine, thiotepa, rituximab): started 06/07/20-08/10/20 for 4 cycles --> pr-->single agent methotrexate x 3 cycles --> further decrease in size of masses   Not a transplant candidate due to poor PFTs and active smoking   Whole brain radiation 2340cGy given at 180cGy x 13 from 01/17/21-02/04/21 --> CR  Relapse - progression seen on routine MRI scan in new sites 08/15/2021 , seizure like activity noted 12/1 and keppra started  Regimen #2:  5 fractions of Cyberknife to involved areas (2500cGy) completed 09/06/21  Ibrutinib started 09/06/21--  MRI brain 11/06/21 - significantly improved   MRI 01/09/22 and 03/12/22: No residual enhancing lesions or disease progression; stable T2/flair- may represent posttreatment changes and superimposed chronic small vessel ischemic disease.     CNS lymphoma:  He received cyberknife as a palliative measure to decrease the known masses as quickly as possible. This is largely a palliative measure, particularly since it is likely that he will develop other lesions in the brain without alternative therapy. Therefore, he was started on treatment with ibrutinib.  There is data for ibrutinib, a first generation BTKi (Soussain et al. Eur J Conacer 2019).  A phase II study of 44 evaluable patients with rel/ref primary CNS DLBCL demonstrated that after 2 months of treatment the disease control rate was 70% with a CR in 19%, PR in 33% and stable disease in 10%.  After a median follow up of 25.7 months, the median PFS was 48 months and overall survival was 19.2 months.  13/44 patients received ibrutinib for over 12 months.  Ibrutinib was detectable in the CSF fluid.  Two patients developed pulmonary aspergillosis. NCCN recommendations include ibrutinib.     He has previously met with transplant. With the poor lung function tests Dr. Sheryn Bison still thought the risk fo transplant outweighed the benefit. The patient has an upcoming appt with pulmonology.     Today's Summary:     No significant changes in clinical course.    Neuro function and exam stable.   Main complaint is lack of desire to eat; he does not report nausea, vomiting or abdominal pain.   He continues remeron and also zyprexa for nausea/appetite.   Will add marinol 2.5 mg BID.     He sustained an injury/cat or dog bite to his left hand yesterday.  This was a fight between his dog and cat. They are vaccinated. I have drawn a line to demarcate existing erythema and they will report to the ED  or medical attention if this does not improve.  Rx. Augmentin.     Plan:  1. Continue ibrutinib at current dose (560 mg daily)- this is a high risk medication so ongoing monitoring is required with labs nd exam  2. Pulmonology visit scheduled 05/21/22  3. MRI, clinic visit with [facility] and Dr. Baird Lyons 06/19/22  4. Marinol 2.5 mg BID for appetite  5. Augmentin Rx for animal bite left hand- return to medical attention if not improving      ============  Interval Hx:    By report he is very active. No falls.   Denies fevers, chills, sweats, chest pain, cough or shortness of breath.  Denies headaches, vision changes or new focal deficits.   Lack of appetite; doesn't want to eat.   No issues with sleep.  Denies abdominal pain, nausea, vomiting or diarrhea.     His dog got out into the back yard with the cat yesterday  and the dog killed the cat.  He tried to break up the fight and sustained bite/scratch to left hand and arm. Now with swelling and erythema. Both animals are vaccinated.     MEDICATIONS:  Current Outpatient Medications   Medication Sig Dispense Refill    albuterol HFA 90 mcg/actuation inhaler Inhale 2-4 puffs every four (4) hours as needed for wheezing. With Spacer 18 g 3    cetirizine (ZYRTEC) 10 MG tablet Take 1 tablet (10 mg total) by mouth daily as needed.      cholecalciferol, vitamin D3-50 mcg, 2,000 unit,, 50 mcg (2,000 unit) tablet Take 1 tablet (50 mcg total) by mouth daily.      finasteride (PROSCAR) 5 mg tablet Take 1 tablet (5 mg total) by mouth daily.      fish oil/borage/flax/om3,6,9 1 (OMEGA 3-6-9 COMPLEX ORAL) Take 1 capsule by mouth daily.      fluoride, sodium, 1.1 % Gel Brush teeth with a pea-sized amount of the paste for 2 min and spit.  No rinsing. Do not eat or drink for 30 minutes after use. Use 2x/day. 56 g prn    glycopyrrolate-formoteroL (BEVESPI AEROSPHERE) 9-4.8 mcg HFAA inhaler Inhale 2 puffs Two (2) times a day. Use with spacer. 10.7 g 6    ibrutinib (IMBRUVICA) 280 mg tablet Take 2 tablets (560 mg total) by mouth daily. 56 tablet 11    levETIRAcetam (KEPPRA) 500 MG tablet Take 1 tablet (500 mg total) by mouth Two (2) times a day. 60 tablet 4    memantine (NAMENDA) 10 MG tablet Take 1 tablet (10 mg total) by mouth Two (2) times a day. 60 tablet 5    metFORMIN (GLUCOPHAGE) 500 MG tablet Take 2 tablets (1,000 mg total) by mouth in the morning and 2 tablets (1,000 mg total) in the evening. Take with meals. 180 tablet 3    mirtazapine (REMERON) 30 MG tablet Take 1 tablet (30 mg total) by mouth nightly. 90 tablet 1    multivitamin (TAB-A-VITE/THERAGRAN) per tablet Take 1 tablet by mouth daily.      OLANZapine (ZYPREXA) 5 MG tablet Take 1 tablet (5 mg total) by mouth nightly. 90 tablet 1    pantoprazole (PROTONIX) 40 MG tablet Take 1 tablet (40 mg total) by mouth daily. 30 tablet 1    amoxicillin-clavulanate (AUGMENTIN) 875-125 mg per tablet Take 1 tablet by mouth Two (2) times a day for 10 days. 20 tablet 0    droNABinol (MARINOL) 2.5 MG capsule Take 1 capsule (2.5 mg total) by mouth Two (2) times a day (30 minutes before a meal). 60 capsule 0    escitalopram oxalate (LEXAPRO) 20 MG tablet Take 1 tablet (20 mg total) by mouth daily. 90 tablet 2     No current facility-administered medications for this visit.         VITAL SIGNS:   Vitals:    05/01/22 0911   BP: 122/83   Pulse: 72   Resp: 17   Temp: 36.3 ??C (97.3 ??F)   TempSrc: Temporal   SpO2: 100%   Weight: 83 kg (182 lb 15.7 oz)       Physical Exam  Constitutional:       General: He is not in acute distress.  Eyes:      General: No scleral icterus.     Extraocular Movements: Extraocular movements intact.      Pupils: Pupils are equal, round, and reactive to light.   Cardiovascular:      Rate  and Rhythm: Regular rhythm.      Heart sounds: Normal heart sounds.   Pulmonary:      Effort: Pulmonary effort is normal.      Breath sounds: Normal breath sounds.   Abdominal:      Palpations: Abdomen is soft.      Tenderness: There is no abdominal tenderness.   Musculoskeletal:         General: No swelling.      Comments: Able to position to exam table independently.  Erythema and edema of left hand/thumb with scatter scratches along hand and forearm   Skin:     General: Skin is warm and dry.   Neurological:      General: No focal deficit present.      Mental Status: He is alert.   Psychiatric:         Mood and Affect: Mood normal.         Behavior: Behavior normal.         LABORATORY:  Lab on 05/01/2022   Component Date Value Ref Range Status    Sodium 05/01/2022 138  135 - 145 mmol/L Final    Potassium 05/01/2022 4.1  3.4 - 4.8 mmol/L Final    Chloride 05/01/2022 108 (H)  98 - 107 mmol/L Final    CO2 05/01/2022 23.0  20.0 - 31.0 mmol/L Final    Anion Gap 05/01/2022 7  5 - 14 mmol/L Final    BUN 05/01/2022 10  9 - 23 mg/dL Final    Creatinine 63/87/5643 0.73  0.60 - 1.10 mg/dL Final    BUN/Creatinine Ratio 05/01/2022 14   Final    eGFR CKD-EPI (2021) Male 05/01/2022 >90  >=60 mL/min/1.22m2 Final    eGFR calculated with CKD-EPI 2021 equation in accordance with [employer] and [employer] of Nephrology Task Force recommendations.    Glucose 05/01/2022 152  70 - 179 mg/dL Final    Calcium 32/95/1884 8.6 (L)  8.7 - 10.4 mg/dL Final    Albumin 16/60/6301 4.0  3.4 - 5.0 g/dL Final    Total Protein 05/01/2022 6.8  5.7 - 8.2 g/dL Final    Total Bilirubin 05/01/2022 0.8  0.3 - 1.2 mg/dL Final    AST 60/07/9322 11  <=34 U/L Final    ALT 05/01/2022 12  10 - 49 U/L Final    Alkaline Phosphatase 05/01/2022 59  46 - 116 U/L Final    WBC 05/01/2022 11.1  3.6 - 11.2 10*9/L Final    RBC 05/01/2022 5.04  4.26 - 5.60 10*12/L Final    HGB 05/01/2022 16.0  12.9 - 16.5 g/dL Final    HCT 55/73/2202 46.1  39.0 - 48.0 % Final    MCV 05/01/2022 91.5  77.6 - 95.7 fL Final    MCH 05/01/2022 31.9  25.9 - 32.4 pg Final    MCHC 05/01/2022 34.8  32.0 - 36.0 g/dL Final    RDW 54/27/0623 14.5  12.2 - 15.2 % Final    MPV 05/01/2022 8.2  6.8 - 10.7 fL Final    Platelet 05/01/2022 198  150 - 450 10*9/L Final    Neutrophils % 05/01/2022 73.3  % Final    Lymphocytes % 05/01/2022 18.9  % Final    Monocytes % 05/01/2022 7.1  % Final    Eosinophils % 05/01/2022 0.3  % Final    Basophils % 05/01/2022 0.4  % Final    Absolute Neutrophils 05/01/2022 8.2 (H)  1.8 - 7.8 10*9/L Final  Absolute Lymphocytes 05/01/2022 2.1  1.1 - 3.6 10*9/L Final    Absolute Monocytes 05/01/2022 0.8  0.3 - 0.8 10*9/L Final    Absolute Eosinophils 05/01/2022 0.0  0.0 - 0.5 10*9/L Final    Absolute Basophils 05/01/2022 0.0  0.0 - 0.1 10*9/L Final

## 2022-05-07 ENCOUNTER — Ambulatory Visit: Admit: 2022-05-07 | Discharge: 2022-05-08 | Payer: PRIVATE HEALTH INSURANCE | Attending: Family | Primary: Family

## 2022-05-07 NOTE — Unmapped (Signed)
Assessment and Plan:      Diagnosis ICD-10-CM Associated Orders   1. Dog bite, initial encounter  W54.0XXA Healing well. Continue Augmentin until completed. Keep wounds clean and dry. Reviewed urgent and return precautions.           I personally spent 22 minutes face-to-face and non-face-to-face in the care of this patient, which includes all pre, intra, and post visit time on the date of service.    Return in about 3 months (around 08/07/2022) for Next scheduled follow up, due for follow up with PCP .    HPI:      Andre Duncan is here for   Chief Complaint   Patient presents with    Dog Bite     His dog bit him last Wednesday night.  Oncologist NP started him on 10 days of Augmentin.  Improving.     Patient presents for recheck of dog bite. He sustained a dog bite to his left hand on 04/30/22 while trying to break up a fight between his dog and cat. The dog is fully vaccinated. PMH of lymphoma. He saw heme/onc on 05/01/22 and was given Augmentin during the follow up visit. He is on day 7 of antibiotics. First day wound was hard and red. Swelling, has improved.   Redness is almost gone.     Immunocompromised. Cancer in remission.     Diagnosed with DM.   Off decadron   On metformin 500 mg BID.   Wife asking about stopping metformin since A1c is stable in the 5 range.              ROS:      Comprehensive 10 point ROS negative unless otherwise stated in the HPI.      PCMH Components:     Medication adherence and barriers to the treatment plan have been addressed. Opportunities to optimize healthy behaviors have been discussed. Patient / caregiver voiced understanding.    Past Medical/Surgical History:     Past Medical History:   Diagnosis Date    Anxiety     COPD (chronic obstructive pulmonary disease) (CMS-HCC)     Cytarabine poisoning     Depression     Diabetes mellitus (CMS-HCC)     Pneumonia, aspiration (CMS-HCC)     Prostate atrophy      No past surgical history on file.    Family History:     Family History   Problem Relation Age of Onset    Cancer Mother     Diabetes Mother     Cancer Father        Social History:     Social History     Tobacco Use    Smoking status: Every Day     Packs/day: 1.00     Years: 30.00     Pack years: 30.00     Types: Cigarettes    Smokeless tobacco: Never   Vaping Use    Vaping Use: Never used   Substance Use Topics    Alcohol use: Not Currently     Comment: rarely    Drug use: Never       Allergies:     Sulfa (sulfonamide antibiotics) and Tegaderm adhesive-no drug-allergy check    Current Medications:     Current Outpatient Medications   Medication Sig Dispense Refill    albuterol HFA 90 mcg/actuation inhaler Inhale 2-4 puffs every four (4) hours as needed for wheezing. With Spacer 18 g 3  amoxicillin-clavulanate (AUGMENTIN) 875-125 mg per tablet Take 1 tablet by mouth Two (2) times a day for 10 days. 20 tablet 0    cetirizine (ZYRTEC) 10 MG tablet Take 1 tablet (10 mg total) by mouth daily as needed.      cholecalciferol, vitamin D3-50 mcg, 2,000 unit,, 50 mcg (2,000 unit) tablet Take 1 tablet (50 mcg total) by mouth daily.      droNABinol (MARINOL) 2.5 MG capsule Take 1 capsule (2.5 mg total) by mouth Two (2) times a day (30 minutes before a meal). 60 capsule 0    escitalopram oxalate (LEXAPRO) 20 MG tablet Take 1 tablet (20 mg total) by mouth daily. 90 tablet 2    finasteride (PROSCAR) 5 mg tablet Take 1 tablet (5 mg total) by mouth daily.      fish oil/borage/flax/om3,6,9 1 (OMEGA 3-6-9 COMPLEX ORAL) Take 1 capsule by mouth daily.      fluoride, sodium, 1.1 % Gel Brush teeth with a pea-sized amount of the paste for 2 min and spit.  No rinsing. Do not eat or drink for 30 minutes after use. Use 2x/day. 56 g prn    glycopyrrolate-formoteroL (BEVESPI AEROSPHERE) 9-4.8 mcg HFAA inhaler Inhale 2 puffs Two (2) times a day. Use with spacer. 10.7 g 6    ibrutinib (IMBRUVICA) 280 mg tablet Take 2 tablets (560 mg total) by mouth daily. 56 tablet 11    levETIRAcetam (KEPPRA) 500 MG tablet Take 1 tablet (500 mg total) by mouth Two (2) times a day. 60 tablet 4    memantine (NAMENDA) 10 MG tablet Take 1 tablet (10 mg total) by mouth Two (2) times a day. 60 tablet 5    metFORMIN (GLUCOPHAGE) 500 MG tablet Take 2 tablets (1,000 mg total) by mouth in the morning and 2 tablets (1,000 mg total) in the evening. Take with meals. 180 tablet 3    mirtazapine (REMERON) 30 MG tablet Take 1 tablet (30 mg total) by mouth nightly. 90 tablet 1    multivitamin (TAB-A-VITE/THERAGRAN) per tablet Take 1 tablet by mouth daily.      OLANZapine (ZYPREXA) 5 MG tablet Take 1 tablet (5 mg total) by mouth nightly. 90 tablet 1    pantoprazole (PROTONIX) 40 MG tablet Take 1 tablet (40 mg total) by mouth daily. 30 tablet 1     No current facility-administered medications for this visit.       Health Maintenance:     Health Maintenance   Topic Date Due    Foot Exam  Never done    Retinal Eye Exam  Never done    Urine Albumin/Creatinine Ratio  Never done    Zoster Vaccines (1 of 2) Never done    COVID-19 Vaccine (3 - Moderna risk series) 02/20/2020    Pneumococcal Vaccine 0-64 (2 - PPSV23 or PCV20) 09/26/2020    Influenza Vaccine (1) 06/06/2022    Colon Cancer Screening  08/02/2022    Hemoglobin A1c  11/01/2022    Serum Creatinine Monitoring  05/02/2023    Potassium Monitoring  05/02/2023    Lipid Screening  07/17/2026    COPD Spirometry  01/16/2027    DTaP/Tdap/Td Vaccines (2 - Td or Tdap) 01/03/2032    Hepatitis C Screen  Completed       Immunizations:     Immunization History   Administered Date(s) Administered    COVID-19 VACCINE,MRNA(MODERNA)(PF) 12/28/2019, 01/23/2020    INFLUENZA INJ MDCK PF, QUAD,(FLUCELVAX)(42MO AND UP EGG FREE) 07/17/2021    Influenza Vaccine Quad (IIV4  PF) 21mo+ injectable 08/15/2020    Influenza Virus Vaccine, unspecified formulation 08/15/2020    Pneumococcal Conjugate 13-Valent 08/01/2020    TdaP 01/02/2022     I have reviewed and (if needed) updated the patient's problem list, medications, allergies, past medical and surgical history, social and family history.     Vital Signs:     Wt Readings from Last 3 Encounters:   05/07/22 83.9 kg (185 lb)   05/01/22 83 kg (182 lb 15.7 oz)   03/13/22 85.9 kg (189 lb 4.8 oz)     Temp Readings from Last 3 Encounters:   05/01/22 36.3 ??C (97.3 ??F) (Temporal)   03/13/22 36.1 ??C (97 ??F) (Temporal)   03/13/22 36.7 ??C (98.1 ??F) (Temporal)     BP Readings from Last 3 Encounters:   05/01/22 122/83   03/28/22 116/67   03/13/22 113/81     Pulse Readings from Last 3 Encounters:   05/01/22 72   03/28/22 68   03/13/22 70     Estimated body mass index is 24.52 kg/m?? as calculated from the following:    Height as of this encounter: 185 cm (6' 0.84).    Weight as of this encounter: 83.9 kg (185 lb).  Facility age limit for growth percentiles is 20 years.      Objective:      General: Alert and oriented x3. Well-appearing. No acute distress.   HEENT:  Normocephalic.  Atraumatic. Conjunctiva and sclera normal. OP MMM without lesions.   Extremities:  No edema. Peripheral pulses normal.   Skin:  Warm, dry. 5 small well-healing lacerations to left hand. No erythema, edema or drainage.   Neuro:  Non-focal. No obvious weakness.   Psych:  Affect normal, eye contact good, speech clear and coherent.     Noralyn Pick, FNP

## 2022-05-09 NOTE — Unmapped (Signed)
[facility] Refill Coordination Note    Specialty Medication(s) to be Shipped:   Hematology/Oncology: Imbruvica    Other medication(s) to be shipped: No additional medications requested for fill at this time     Andre Duncan, DOB: December 11, 1965  Phone: [phone] (home)       All above HIPAA information was verified with patient's family member, wife.     Was a Nurse, learning disability used for this call? No    Completed refill call assessment today to schedule patient's medication shipment from the [facility] [phone]).  All relevant notes have been reviewed.     Specialty medication(s) and dose(s) confirmed: Regimen is correct and unchanged.   Changes to medications: Jediel reports no changes at this time.  Changes to insurance: No  New side effects reported not previously addressed with a pharmacist or physician: None reported  Questions for the pharmacist: No    Confirmed patient received a Conservation officer, historic buildings and a Surveyor, mining with first shipment. The patient will receive a drug information handout for each medication shipped and additional FDA Medication Guides as required.       DISEASE/MEDICATION-SPECIFIC INFORMATION        N/A    SPECIALTY MEDICATION ADHERENCE     Medication Adherence    Patient reported X missed doses in the last month: 0  Specialty Medication: Imbruvica 280mg   Patient is on additional specialty medications: No  Patient is on more than two specialty medications: No  Any gaps in refill history greater than 2 weeks in the last 3 months: no  Demonstrates understanding of importance of adherence: yes  Informant: spouse              Were doses missed due to medication being on hold? No    Imbruvica 280mg : Patient has 3 days of medication on hand    REFERRAL TO PHARMACIST     Referral to the pharmacist: Not needed      [facility]     Shipping address confirmed in Epic.     Delivery Scheduled: Yes, Expected medication delivery date: 8/7.     Medication will be delivered via Same Day Courier to the prescription address in Epic WAM.    Olga Millers   [facility] Specialty Technician

## 2022-05-12 MED FILL — IMBRUVICA 280 MG TABLET: ORAL | 28 days supply | Qty: 56 | Fill #2

## 2022-05-15 NOTE — Unmapped (Signed)
lvm with via interpreter to call and schedule appointment       Comments:   6 month f/u seizure and nemantine

## 2022-05-21 ENCOUNTER — Ambulatory Visit: Admit: 2022-05-21 | Discharge: 2022-05-21 | Payer: PRIVATE HEALTH INSURANCE

## 2022-05-21 DIAGNOSIS — J439 Emphysema, unspecified: Principal | ICD-10-CM

## 2022-05-21 DIAGNOSIS — F17200 Nicotine dependence, unspecified, uncomplicated: Principal | ICD-10-CM

## 2022-05-21 MED ORDER — NICOTINE 21 MG/24 HR DAILY TRANSDERMAL PATCH
MEDICATED_PATCH | TRANSDERMAL | 0 refills | 42 days | Status: CP
Start: 2022-05-21 — End: 2022-07-02

## 2022-05-21 MED ORDER — IPRATROPIUM 0.5 MG-ALBUTEROL 3 MG (2.5 MG BASE)/3 ML NEBULIZATION SOLN
Freq: Four times a day (QID) | RESPIRATORY_TRACT | 11 refills | 23 days | Status: CP | PRN
Start: 2022-05-21 — End: ?

## 2022-05-21 MED ORDER — NICOTINE (POLACRILEX) 4 MG GUM
BUCCAL | 11 refills | 5 days | Status: CP | PRN
Start: 2022-05-21 — End: ?

## 2022-05-21 NOTE — Unmapped (Signed)
Pulmonary Clinic - Follow-up Visit    HISTORY:     Active Pulmonary Problems & Brief History:  Andre Duncan is a 56 y.o. 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    He was diagnosed with COPD in 2013-2014. No history of asthma. Has some concurrent allergic rhinitis and GERD.     He reportedly developed lung scarring from cytarabine, as well as recurrent aspiration pneumonia while on chemotherapy. He was previously determined not to be a candidate for BMT based on abnormal PFTs (corrected DLCO 49% in 12/2020) and ongoing tobacco use. He underwent palliative whole brain radiation in 2022 in lieu of SCT. He had relapse 08/2021 and again received radiation. This has been complicated by cognitive and short term memory deficits, and he is now on memantine. Recent MRIs without progression of disease. Their overall focus is quality of life. He has done PT to work on conditioning and activity tolerance.    He started smoking at age 83 and used to smoke 3 PPD. 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 He is currently smoking 1.5 PPD. He has tried patches in the past. He hates the lozenges.    Has bad allergies in the Spring and Fall. He takes Zyrtec and Flonase.    Interval History:  Initial visit took place 01/15/2022. He was initially referred for evaluation of COPD, abnormal chest imaging, abnormal PFTs, and consideration of BMT for lymphoma. Patient was last seen in pulmonary clinic 01/15/2022. In the interim, he has been using Bevespi and albuterol PRN. He has had a CT scan that demonstrated COPD (bronchial wall thickening, and emphysematous changes) and scarring at bases from prior pneumonias without clear pulmonary fibrosis. Lung function testing demonstrated potential mild obstructive impairment with preserved ratio and non-significant improvement with bronchodilators. Sleep study has not been scheduled or completed yet.     History of Present Illness:  He is accompanied by his wife. An in-person interpreter is present.     Since his last visit, he has been using Bevespi 2 puffs BID with a spacer. He is also using albuterol 1-2 times per week. He can definitely tell that the inhalers are helping, though his wife notes that he was prescribed CombiVent in the past and it helped more than the albuterol. She would love for him to have a nebulizer.     His cough has resolved. He continues to have morning sputum production, but this is significantly improved. He denies shortness of breath, but his family members think this is because he is used to it; they can tell that he gets winded with exertion. He does not feel short of breath after walking here from the parking deck.    No shortness of breath per him. Family members notice that he looks winded. No SOB from parking deck. He has never required oxygen outside of the hospital. He has not taken oral steroids recently. He recently took Augmentin for a dog bite, but his last antibiotics for breathing issues were 10/2021. He has not been hospitalized recently.       Past Medical History: The medical and surgical history were personally reviewed and updated in the patient's electronic medical record. Pertinent positives are included in HPI.     Home Medications: Medications were reviewed and updated in the patient's electronic medical record. Pertinent positives are included  in HPI.    Allergies: Allergies were reviewed and updated in the patient's electronic medical record.    Family History: Family history was reviewed and updated in the patient's electronic medical record. Pertinent positives are included in HPI.     Social History: Social history was reviewed and updated in the patient's electronic medical record. Pertinent positives are included in HPI.    Review of Systems: A comprehensive review of systems was completed and negative except as noted in HPI.    PHYSICAL EXAM:     Vitals:    05/21/22 1431   BP: 102/70   Pulse: 68   Temp: 36.3 ??C (97.4 ??F)   SpO2: 98%       Gen: Alert, awake. Well-appearing. NAD.  HEENT: NCAT. Sclerae anicteric. Conjunctivae clear. MMM. Oropharynx clear.  Neck: Supple. Trachea midline.  Pulm: CTAB. No crackles, wheezes, or rhonchi. Normal WOB on RA.  CV: RRR, no MRG. Distal pulses 2+.  Abd: Soft, nondistended, nontender.  Ext: Well-perfused. No edema. No clubbing.  Skin: Warm, dry, intact. No rashes, wounds, or cyanosis.  Neuro: Alert, awake. Normal speech & language. Cranial nerves II-XII grossly intact.    LABORATORY and RADIOLOGY DATA:     Records were reviewed within our EMR, CareEverywhere, and the media tab.     Pulmonary Function Tests/Interpretation:    Date FEV1  (Pre-Post) FVC  (Pre-Post) FEV1/FVC   DLCO Comments   01/15/21 2.58 (61.9%) - 2.83 (68%) 3.83 (71.5%) - 3.86 (72.1%) 73 15.66 (50%) Suggestive of obstruction despite normal ratio   12/11/20 2.68 (63.8%) 3.77 (69.9%) 71 15.41 (49%) corrected        :    Date Distance walked (m) Resting SpO2 Nadir SpO2 O2 required Comments   05/21/22 357m 99 on RA 97 on RA at 3 minutes None        Pertinent Laboratory Data:  05/01/2022: WBC 11.1 (0.3% eos, 0 absolute eos), Hgb 16, HCO3 23    Pertinent Imaging Data:  HRCT chest 02/05/2022:  Moderate centrilobular and paraseptal emphysema with mild bronchial wall thickening consistent with likely COPD.   Stable subsegmental atelectasis in the right middle lobe and scarring in the lingula and bilateral lower lobes.  Interval new patchy groundglass opacity in the left upper lobe which could be due to infection or inflammation.   No ILD.    COPD (bronchial wall thickening, and emphysematous changes) and scarring at bases from prior pneumonias, no clear pulmonary fibrosis   Awaiting lung function testing to be scheduled - gave clinic number to call  Is feeling a bit better since starting inhaler, coughing less  All questions answered    Pertinent Cardiac Data:  Echo 12/10/2020:     1. The left ventricle is normal in size with normal wall thickness.    2. The left ventricular systolic function is normal, LVEF is visually estimated at > 55%.    3. The right ventricle is normal in size, with normal systolic function.    4. There is no pulmonary hypertension, estimated pulmonary artery systolic pressure is 21 mmHg.    Pertinent Sleep Data:  Polysomnography ordered, not yet done    ASSESSMENT and PLAN     Problem List:  Patient Active Problem List   Diagnosis    Primary CNS lymphoma (CMS-HCC)    Tobacco use    Neutropenia (CMS-HCC)    Dental disease    COPD (chronic obstructive pulmonary disease) (CMS-HCC)    Diffuse large B cell lymphoma (CMS-HCC)  Anxiety and depression    BPH (benign prostatic hyperplasia)    Seasonal allergies    Insomnia    Poor short term memory    Hyperlipidemia    Elevated glucose    Tobacco use disorder       Assessment & Plan:   Andre Duncan is a 56 y.o. 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 PFTs demonstrate normal FEV1/FVC ratio but remain suggestive of obstruction with mild scooping of expiratory limb on flow volume loop and reduced FVC. This could be suggestive of pseudonormalization in the setting of basilar scarring (potentially from recurrent aspiration pneumonias vs cytarabine toxicity), though recent HRCT was without evidence of significant fibrosis. DLCO also severely reduced. Lung volumes have been ordered but not completed. without evidence of exertional hypoxia. Symptoms have improved significantly with LAMA/LABA, supporting diagnosis of COPD. There have also been concerns for undiagnosed sleep apnea based on snoring and pauses in breathing while sleeping; polysomnography has been ordered but not completed. He continues to smoke. His goals of care are quality of life in the setting of CNS lymphoma treated with palliative radiation. Recommended smoking cessation to improve respiratory symptoms. He is willing to try nicotine patches or gum, but is not interested in lozenges or Chantix. 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    Pulmonary emphysema and chronic bronchitis  - Continue Bevespi 2 puffs BID with spacer. Avoiding ICS at this time due to history of recurrent pneumonia.   -     Start ipratropium-albuteroL; Inhale 3 mL by nebulization every six (6) hours as needed (wheezing, shortness of breath).  -     Nebulizers (DME)    Tobacco use disorder  - Counseling provided  -     nicotine; Place 1 patch on the skin daily. 1 nicotine patch (21 mg/day) daily for six weeks  -     nicotine; Place 1 patch on the skin daily for 14 days. 14 mg patch daily for two weeks  -     nicotine; Place 1 patch on the skin daily for 14 days. 7 mg daily for two week  -     nicotine (polacrilex); Apply 1 each (4 mg total) to cheek every hour as needed for smoking cessation. Use according to the following 12-week dosing schedule: Weeks 1 to 6: Chew 1 piece of gum every 1 to 2 hours (maximum: 24 pieces/day); to increase chances of quitting, chew at least 9 pieces/day during the first 6 weeks. Weeks 7 to 9: Chew 1 piece of gum every 2 to 4 hours (maximum: 24 pieces/day). Weeks 10 to 12: Chew 1 piece of gum every 4 to 8 hours (maximum: 24 pieces/day).    Severely reduced DLCO       - Consider lung volumes    Snoring, pauses in breathing       - Provided phone number to schedule sleep study    Healthcare maintenance       - Qualifies for lung cancer screening    The patient was seen with Dr. Morene Antu and will return to clinic in 6 months or sooner if needed.    Marline Morace L. Lawanda Cousins, MD  Pulmonary & Critical Care Fellow

## 2022-05-21 NOTE — Unmapped (Addendum)
Continue your Bevespi inhaler with a spacer. We are prescribing DuoNebs to use every 6 hours as needed for shortness of breath or wheezing.    Your breathing can improve if you stop smoking. Use the nicotine replacement as follows:   PATCH: 21mg  daily for 6 weeks, THEN 14mg  daily for 2 weeks, THEN 7mg  daily for 2 weeks.  GUM: Use according to the following 12-week dosing schedule:  Weeks 1 to 6: Chew 1 piece of gum every 1 to 2 hours (maximum: 24 pieces/day); to increase chances of quitting, chew at least 9 pieces/day during the first 6 weeks.   Weeks 7 to 9: Chew 1 piece of gum every 2 to 4 hours (maximum: 24 pieces/day).   Weeks 10 to 12: Chew 1 piece of gum every 4 to 8 hours (maximum: 24 pieces/day).    You should call the [facility] to schedule a sleep study to evaluate for sleep apnea. You can reach them at [phone].

## 2022-05-23 NOTE — Unmapped (Signed)
Faxed neb and supplies order to [facility] [phone].  Fax confirmation received.

## 2022-05-27 ENCOUNTER — Other Ambulatory Visit: Payer: Self-pay | Admitting: Urology

## 2022-05-27 DIAGNOSIS — F32A Anxiety and depression: Principal | ICD-10-CM

## 2022-05-27 DIAGNOSIS — F419 Anxiety disorder, unspecified: Principal | ICD-10-CM

## 2022-05-27 MED ORDER — MIRTAZAPINE 30 MG TABLET
ORAL_TABLET | Freq: Every evening | ORAL | 0 refills | 90 days | Status: CP
Start: 2022-05-27 — End: ?

## 2022-05-27 MED ORDER — OLANZAPINE 5 MG TABLET
ORAL_TABLET | Freq: Every evening | ORAL | 0 refills | 90 days | Status: CP
Start: 2022-05-27 — End: ?

## 2022-05-27 NOTE — Unmapped (Signed)
Patient is requesting the following refill  Requested Prescriptions     Pending Prescriptions Disp Refills    mirtazapine (REMERON) 30 MG tablet [Pharmacy Med Name: Mirtazapine 30 MG Oral Tablet] 90 tablet 0     Sig: Take 1 tablet by mouth nightly    OLANZapine (ZYPREXA) 5 MG tablet [Pharmacy Med Name: OLANZapine 5 MG Oral Tablet] 90 tablet 0     Sig: Take 1 tablet by mouth nightly       Recent Visits  Date Type Provider Dept   05/07/22 Office Visit Keri Rosita Fire, FNP [facility] [address] [facility]   11/14/21 Office Visit Amie Portland, FNP [facility]   07/17/21 Office Visit Amie Portland, FNP [facility]   Showing recent visits within past 365 days with a meds authorizing provider and meeting all other requirements  Future Appointments  No visits were found meeting these conditions.  Showing future appointments within next 365 days with a meds authorizing provider and meeting all other requirements

## 2022-06-06 NOTE — Unmapped (Signed)
[facility] Refill Coordination Note    Specialty Medication(s) to be Shipped:   Hematology/Oncology: Imbruvica    Other medication(s) to be shipped: No additional medications requested for fill at this time     Andre Duncan, DOB: 03/12/1966  Phone: [phone] (home)       All above HIPAA information was verified with patient's family member, spouse.     Was a Nurse, learning disability used for this call? No    Completed refill call assessment today to schedule patient's medication shipment from the [facility] [phone]).  All relevant notes have been reviewed.     Specialty medication(s) and dose(s) confirmed: Regimen is correct and unchanged.   Changes to medications: Andre Duncan reports no changes at this time.  Changes to insurance: No  New side effects reported not previously addressed with a pharmacist or physician: None reported  Questions for the pharmacist: No    Confirmed patient received a Conservation officer, historic buildings and a Surveyor, mining with first shipment. The patient will receive a drug information handout for each medication shipped and additional FDA Medication Guides as required.       DISEASE/MEDICATION-SPECIFIC INFORMATION        N/A    SPECIALTY MEDICATION ADHERENCE     Medication Adherence    Specialty Medication: Imbruvica 280mg   Patient is on additional specialty medications: No  Informant: spouse                                Were doses missed due to medication being on hold? No    imbruvica 280 mg: 10 days of medicine on hand       REFERRAL TO PHARMACIST     Referral to the pharmacist: Not needed      [facility]     Shipping address confirmed in Epic.     Delivery Scheduled: Yes, Expected medication delivery date: 09/07.     Medication will be delivered via Next Day Courier to the prescription address in Epic WAM.    Andre Duncan   [facility] Specialty Technician

## 2022-06-11 MED FILL — IMBRUVICA 280 MG TABLET: ORAL | 28 days supply | Qty: 56 | Fill #3

## 2022-06-19 ENCOUNTER — Ambulatory Visit
Admit: 2022-06-19 | Discharge: 2022-06-20 | Payer: PRIVATE HEALTH INSURANCE | Attending: Radiation Oncology | Primary: Radiation Oncology

## 2022-06-19 ENCOUNTER — Ambulatory Visit: Admit: 2022-06-19 | Discharge: 2022-06-19 | Payer: PRIVATE HEALTH INSURANCE

## 2022-06-19 ENCOUNTER — Other Ambulatory Visit: Admit: 2022-06-19 | Discharge: 2022-06-19 | Payer: PRIVATE HEALTH INSURANCE

## 2022-06-19 DIAGNOSIS — C8589 Other specified types of non-Hodgkin lymphoma, extranodal and solid organ sites: Principal | ICD-10-CM

## 2022-06-19 LAB — CBC W/ AUTO DIFF
BASOPHILS ABSOLUTE COUNT: 0.1 10*9/L (ref 0.0–0.1)
BASOPHILS RELATIVE PERCENT: 0.6 %
EOSINOPHILS ABSOLUTE COUNT: 0.1 10*9/L (ref 0.0–0.5)
EOSINOPHILS RELATIVE PERCENT: 0.9 %
HEMATOCRIT: 48.5 % — ABNORMAL HIGH (ref 39.0–48.0)
HEMOGLOBIN: 16.7 g/dL — ABNORMAL HIGH (ref 12.9–16.5)
LYMPHOCYTES ABSOLUTE COUNT: 2.3 10*9/L (ref 1.1–3.6)
LYMPHOCYTES RELATIVE PERCENT: 24.6 %
MEAN CORPUSCULAR HEMOGLOBIN CONC: 34.5 g/dL (ref 32.0–36.0)
MEAN CORPUSCULAR HEMOGLOBIN: 31.9 pg (ref 25.9–32.4)
MEAN CORPUSCULAR VOLUME: 92.6 fL (ref 77.6–95.7)
MEAN PLATELET VOLUME: 8.4 fL (ref 6.8–10.7)
MONOCYTES ABSOLUTE COUNT: 0.8 10*9/L (ref 0.3–0.8)
MONOCYTES RELATIVE PERCENT: 9 %
NEUTROPHILS ABSOLUTE COUNT: 6 10*9/L (ref 1.8–7.8)
NEUTROPHILS RELATIVE PERCENT: 64.9 %
PLATELET COUNT: 211 10*9/L (ref 150–450)
RED BLOOD CELL COUNT: 5.24 10*12/L (ref 4.26–5.60)
RED CELL DISTRIBUTION WIDTH: 14.5 % (ref 12.2–15.2)
WBC ADJUSTED: 9.3 10*9/L (ref 3.6–11.2)

## 2022-06-19 LAB — COMPREHENSIVE METABOLIC PANEL
ALBUMIN: 4 g/dL (ref 3.4–5.0)
ALKALINE PHOSPHATASE: 55 U/L (ref 46–116)
ALT (SGPT): 11 U/L (ref 10–49)
ANION GAP: 8 mmol/L (ref 5–14)
AST (SGOT): 12 U/L (ref <=34)
BILIRUBIN TOTAL: 0.8 mg/dL (ref 0.3–1.2)
BLOOD UREA NITROGEN: 11 mg/dL (ref 9–23)
BUN / CREAT RATIO: 13
CALCIUM: 8.9 mg/dL (ref 8.7–10.4)
CHLORIDE: 109 mmol/L — ABNORMAL HIGH (ref 98–107)
CO2: 24 mmol/L (ref 20.0–31.0)
CREATININE: 0.83 mg/dL
EGFR CKD-EPI (2021) MALE: >90 mL/min/{1.73_m2} (ref >=60)
GLUCOSE RANDOM: 81 mg/dL (ref 70–179)
POTASSIUM: 3.9 mmol/L (ref 3.4–4.8)
PROTEIN TOTAL: 6.9 g/dL (ref 5.7–8.2)
SODIUM: 141 mmol/L (ref 135–145)

## 2022-06-19 MED ORDER — DRONABINOL 2.5 MG CAPSULE
ORAL_CAPSULE | Freq: Two times a day (BID) | ORAL | 0 refills | 60 days | Status: CP
Start: 2022-06-19 — End: 2022-08-18

## 2022-06-19 MED ADMIN — gadobenate dimeglumine (MULTIHANCE) 529 mg/mL (0.1mmol/0.2mL) solution 16.14 mL: .1 mmol/kg | INTRAVENOUS | @ 12:00:00 | Stop: 2022-06-19

## 2022-06-19 NOTE — Unmapped (Signed)
Flu vaccine administered in LEFT arm. Education attached to AVS and patient advised of site soreness, muscle aches and headaches following flu shot administration.

## 2022-06-19 NOTE — Unmapped (Signed)
RADIATION ONCOLOGY FOLLOW UP NOTE     Encounter Date: 06/19/2022  Patient Name: Andre Duncan  Medical Record Number: [id number]    Referring Physician: Dr. Elmon Kirschner    ASSESSMENT:  56 year old man with primary CNS lymphoma s/p matrix regimen, high dose methotrexate, and consolidative radiation with 2340cGy in 02/2021, now s/p CyberKnife (500cGy x 5) 09/2021 for relapsed disease and currently receiving ibrutinib     MRI today small area of approximately 1 cm enhancing focus in the right parietal lobe in the previously treated area which may represent radiation necrosis versus recurrent disease. 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  Currently patient asymptomatic    PLAN:    We will plan for short interval MRI in 6 weeks for further evaluation.  He will alert [place] if he develops any symptoms in which case we would reimage sooner and start steroids.    --------------------------    Interval History:  He is seen with his wife.  No new neurologic symptoms, no seizures, no headaches.  Currently is largely asymptomatic, is having decreased oral intake that he believes is a side effect of his current ibrutinib medication.  It has been difficult to keep his weight.  Additionally had an accident with his dog and cat recently that has been weighing heavily on him.    MRI brain today - New 0.9 cm enhancing focus in the right parafalcine parietal lobe which corresponds to previously treated focus of disease may represent radiation necrosis versus recurrent disease. Attention on follow-up.     Oncology History   Primary CNS lymphoma (CMS-HCC)   11/28/2020 Initial Diagnosis    Primary CNS lymphoma (CMS-HCC)     11/30/2020 - 11/30/2020 Chemotherapy    IP LYMPHOMA HIGH-DOSE METHOTREXATE  riTUXimab 375 mg/m2 (standard and rapid infusion) IV on day 1, vinCRIStine 1.4 mg/m2 (max 2 mg) IV on day 1, DOXOrubicin 50 mg/m2 IV on day 1, cyclophosphamide 750 mg/m2 IV on day 1, predniSONE 100 mg PO on days 1-5, methotrexate (high-dose) IV on Day 15 (even cycles), every 21-day.     01/03/2021 -  Radiation    Radiation Therapy Treatment Details (Noted on 01/03/2021)  Site: Midline Brain  Technique: 3D CRT  Goal: No goal specified  Planned Treatment Start Date: No planned start date specified     08/23/2021 -  Radiation    Radiation Therapy Treatment Details (Noted on 08/23/2021)  Site: Bilateral Brain  Technique: SRS  Goal: No goal specified  Planned Treatment Start Date: No planned start date specified       ROS:  A 10 systems was negative except for pertinent positives noted in HPI.    Physical Examination:  VITAL SIGNS: Temp 36.1 ??C (97 ??F) (Temporal)  - Wt 78.9 kg (174 lb)  - BMI 23.27 kg/m??   CONSTITUTIONAL:   Engaged middle aged male  EYES:  Sclera anicteric, EOMI,   Ears, Nose, Mouth, Throat: Facial movements full and symmetric, tongue midline. No visual field deficits.   NECK:  Supple   CV:  Regular rate   RESPIRATORY: Breathing comfortably   MUSCULOSKELETAL: Independent gait, no assistive devices.  5/5 upper and lower  PSYCHIATRIC:  Mood is normal.    EXTREMITIES:     No edema  NEUROLOGIC:  Oriented x 3. CN II-XII grossly intact.      Avelina Laine, MD  PGY5 Radiation Oncology      -I saw and evaluated/examined  the patient, participating in the key portions of the service.  I discussed the findings, assessment, and plan of care with the resident/APP. I personally reviewed all relevant data associated with this encounter including imaging, labwork, and prior records. I have edited and agree with the findings and plan as documented in the resident/APP's note.  Dana L. Baird Lyons, MD  Assistant Professor  Department of Radiation Oncology

## 2022-06-19 NOTE — Unmapped (Signed)
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  Regimen: MATRIX (Methotrexate, cytarabine, thiotepa, rituximab): started 06/07/20-08/10/20 for 4 cycles --> pr-->single agent methotrexate x 3 cycles --> further decrease in size of masses   Not a transplant candidate due to poor PFTs and active smoking   Whole brain radiation 2340cGy given at 180cGy x 13 from 01/17/21-02/04/21 --> CR  Relapse - progression seen on routine MRI scan in new sites 08/15/2021 , seizure like activity noted 12/1 and keppra started  Regimen #2:  5 fractions of Cyberknife to involved areas (2500cGy) completed 09/06/21  Ibrutinib started 09/06/21--  MRI brain 11/06/21 - significantly improved   MRI 01/09/22 and 03/12/22: No residual enhancing lesions or disease progression; stable T2/flair- may represent posttreatment changes and superimposed chronic small vessel ischemic disease.     CNS lymphoma:  He received cyberknife as a palliative measure to decrease the known masses as quickly as possible. This is largely a palliative measure, particularly since it is likely that he will develop other lesions in the brain without alternative therapy. Therefore, he was started on treatment with ibrutinib.  There is data for ibrutinib, a first generation BTKi (Soussain et al. Eur J Conacer 2019).  A phase II study of 44 evaluable patients with rel/ref primary CNS DLBCL demonstrated that after 2 months of treatment the disease control rate was 70% with a CR in 19%, PR in 33% and stable disease in 10%.  After a median follow up of 25.7 months, the median PFS was 48 months and overall survival was 19.2 months.  13/44 patients received ibrutinib for over 12 months.  Ibrutinib was detectable in the CSF fluid.  Two patients developed pulmonary aspergillosis. NCCN recommendations include ibrutinib.     He has previously met with transplant. With the poor lung function tests Dr. Sheryn Bison still thought the risk fo transplant outweighed the benefit. The patient has an upcoming appt with pulmonology.     Today's Summary:       Neuro function and exam stable.   Still having issues with forgetting to eat/not having an interest in eating sometimes.   He continues to lose weight; started marinol 2.5 mg BID last assessment- with report of some improvement.   Will increase marinol to 5 mg BID.  Encouraged nutrition shakes/supplements.      He continues remeron and also zyprexa for nausea/appetite.     MRI brain today reviewed and discussed with team (Dr. Elmon Kirschner, Dr. Baird Lyons and Dr. Flonnie Hailstone)-- there is note of a new 0.9 cm enhancing focus in the right parietal lobe- site of prior SBRT- differential is new focus of disease versus radiation necrosis.     Will plan for interval MRI and visit in 6 weeks.     Plan:  1. Continue ibrutinib at current dose (560 mg daily)- this is a high risk medication so ongoing monitoring is required with labs nd exam  2. Marinol increase to 5 mg BID   3. Repeat brain MRI and return visit in 6 weeks        ============  Interval Hx:    No significant changes in health status since last assessment.   Some continued weight loss; not much interest in eating; forgets to eat. Marinol has helped some.   Denies fevers, chills, sweats, chest pain or shortness of breath.   Denies headaches, focal deficits.   Denies balance issues or falls.     MEDICATIONS:  Current Outpatient Medications   Medication Sig Dispense Refill  albuterol HFA 90 mcg/actuation inhaler Inhale 2-4 puffs every four (4) hours as needed for wheezing. With Spacer 18 g 3    cetirizine (ZYRTEC) 10 MG tablet Take 1 tablet (10 mg total) by mouth daily as needed.      cholecalciferol, vitamin D3-50 mcg, 2,000 unit,, 50 mcg (2,000 unit) tablet Take 1 tablet (50 mcg total) by mouth daily.      escitalopram oxalate (LEXAPRO) 20 MG tablet Take 1 tablet (20 mg total) by mouth daily. 90 tablet 2    finasteride (PROSCAR) 5 mg tablet Take 1 tablet (5 mg total) by mouth daily.      fish oil/borage/flax/om3,6,9 1 (OMEGA 3-6-9 COMPLEX ORAL) Take 1 capsule by mouth daily.      fluoride, sodium, 1.1 % Gel Brush teeth with a pea-sized amount of the paste for 2 min and spit.  No rinsing. Do not eat or drink for 30 minutes after use. Use 2x/day. 56 g prn    glycopyrrolate-formoteroL (BEVESPI AEROSPHERE) 9-4.8 mcg HFAA inhaler Inhale 2 puffs Two (2) times a day. Use with spacer. 10.7 g 6    ibrutinib (IMBRUVICA) 280 mg tablet Take 2 tablets (560 mg total) by mouth daily. 56 tablet 11    ipratropium-albuteroL (DUO-NEB) 0.5-2.5 mg/3 mL nebulizer Inhale 3 mL by nebulization every six (6) hours as needed (wheezing, shortness of breath). 270 mL 11    levETIRAcetam (KEPPRA) 500 MG tablet Take 1 tablet (500 mg total) by mouth Two (2) times a day. 60 tablet 4    memantine (NAMENDA) 10 MG tablet Take 1 tablet (10 mg total) by mouth Two (2) times a day. 60 tablet 5    metFORMIN (GLUCOPHAGE) 500 MG tablet Take 2 tablets (1,000 mg total) by mouth in the morning and 2 tablets (1,000 mg total) in the evening. Take with meals. 180 tablet 3    mirtazapine (REMERON) 30 MG tablet Take 1 tablet by mouth nightly 90 tablet 0    multivitamin (TAB-A-VITE/THERAGRAN) per tablet Take 1 tablet by mouth daily.      [START ON 07/02/2022] nicotine (NICODERM CQ) 14 mg/24 hr patch Place 1 patch on the skin daily for 14 days. 14 mg patch daily for two weeks 14 patch 0    nicotine (NICODERM CQ) 21 mg/24 hr patch Place 1 patch on the skin daily. 1 nicotine patch (21 mg/day) daily for six weeks 42 patch 0    [START ON 07/16/2022] nicotine (NICODERM CQ) 7 mg/24 hr patch Place 1 patch on the skin daily for 14 days. 7 mg daily for two week 14 patch 0    nicotine polacrilex (NICORETTE) 4 MG gum Apply 1 each (4 mg total) to cheek every hour as needed for smoking cessation. Use according to the following 12-week dosing schedule: Weeks 1 to 6: Chew 1 piece of gum every 1 to 2 hours (maximum: 24 pieces/day); to increase chances of quitting, chew at least 9 pieces/day during the first 6 weeks. Weeks 7 to 9: Chew 1 piece of gum every 2 to 4 hours (maximum: 24 pieces/day). Weeks 10 to 12: Chew 1 piece of gum every 4 to 8 hours (maximum: 24 pieces/day). 100 each 11    OLANZapine (ZYPREXA) 5 MG tablet Take 1 tablet by mouth nightly 90 tablet 0    droNABinol (MARINOL) 2.5 MG capsule Take 2 capsules (5 mg total) by mouth Two (2) times a day (30 minutes before a meal). 240 capsule 0    pantoprazole (PROTONIX) 40 MG tablet Take  1 tablet (40 mg total) by mouth daily. (Patient not taking: Reported on 06/19/2022) 30 tablet 1     Current Facility-Administered Medications   Medication Dose Route Frequency Provider Last Rate Last Admin    influenza vaccine quad (FLUARIX, FLULAVAL, FLUZONE) (6 MOS & UP) 2023-24 ADS Med                  VITAL SIGNS:   Vitals:    06/19/22 1045   BP: 125/76   Pulse: 69   Resp: 17   Temp: 36.2 ??C (97.1 ??F)   TempSrc: Temporal   SpO2: 100%   Weight: 78.9 kg (174 lb)       Physical Exam  Constitutional:       General: He is not in acute distress.  Eyes:      General: No scleral icterus.     Extraocular Movements: Extraocular movements intact.      Pupils: Pupils are equal, round, and reactive to light.   Cardiovascular:      Rate and Rhythm: Regular rhythm.      Heart sounds: Normal heart sounds.   Pulmonary:      Effort: Pulmonary effort is normal.      Breath sounds: Normal breath sounds.   Abdominal:      Palpations: Abdomen is soft.      Tenderness: There is no abdominal tenderness.   Musculoskeletal:         General: No swelling.   Lymphadenopathy:      Cervical: No cervical adenopathy.      Upper Body:      Right upper body: No supraclavicular or axillary adenopathy.      Left upper body: No supraclavicular or axillary adenopathy.   Skin:     General: Skin is warm and dry.   Neurological:      General: No focal deficit present.      Mental Status: He is alert.      Motor: No weakness.   Psychiatric:         Mood and Affect: Mood normal. Behavior: Behavior normal.           LABORATORY:  Lab on 06/19/2022   Component Date Value Ref Range Status    Sodium 06/19/2022 141  135 - 145 mmol/L Final    Potassium 06/19/2022 3.9  3.4 - 4.8 mmol/L Final    Chloride 06/19/2022 109 (H)  98 - 107 mmol/L Final    CO2 06/19/2022 24.0  20.0 - 31.0 mmol/L Final    Anion Gap 06/19/2022 8  5 - 14 mmol/L Final    BUN 06/19/2022 11  9 - 23 mg/dL Final    Creatinine 16/07/9603 0.83  0.60 - 1.10 mg/dL Final    BUN/Creatinine Ratio 06/19/2022 13   Final    eGFR CKD-EPI (2021) Male 06/19/2022 >90  >=60 mL/min/1.29m2 Final    eGFR calculated with CKD-EPI 2021 equation in accordance with [employer] and [employer] of Nephrology Task Force recommendations.    Glucose 06/19/2022 81  70 - 179 mg/dL Final    Calcium 54/06/8118 8.9  8.7 - 10.4 mg/dL Final    Albumin 14/78/2956 4.0  3.4 - 5.0 g/dL Final    Total Protein 06/19/2022 6.9  5.7 - 8.2 g/dL Final    Total Bilirubin 06/19/2022 0.8  0.3 - 1.2 mg/dL Final    AST 21/30/8657 12  <=34 U/L Final    ALT 06/19/2022 11  10 - 49 U/L Final  Alkaline Phosphatase 06/19/2022 55  46 - 116 U/L Final    WBC 06/19/2022 9.3  3.6 - 11.2 10*9/L Final    RBC 06/19/2022 5.24  4.26 - 5.60 10*12/L Final    HGB 06/19/2022 16.7 (H)  12.9 - 16.5 g/dL Final    HCT 16/07/9603 48.5 (H)  39.0 - 48.0 % Final    MCV 06/19/2022 92.6  77.6 - 95.7 fL Final    MCH 06/19/2022 31.9  25.9 - 32.4 pg Final    MCHC 06/19/2022 34.5  32.0 - 36.0 g/dL Final    RDW 54/06/8118 14.5  12.2 - 15.2 % Final    MPV 06/19/2022 8.4  6.8 - 10.7 fL Final    Platelet 06/19/2022 211  150 - 450 10*9/L Final    Neutrophils % 06/19/2022 64.9  % Final    Lymphocytes % 06/19/2022 24.6  % Final    Monocytes % 06/19/2022 9.0  % Final    Eosinophils % 06/19/2022 0.9  % Final    Basophils % 06/19/2022 0.6  % Final    Absolute Neutrophils 06/19/2022 6.0  1.8 - 7.8 10*9/L Final    Absolute Lymphocytes 06/19/2022 2.3  1.1 - 3.6 10*9/L Final    Absolute Monocytes 06/19/2022 0.8  0.3 - 0.8 10*9/L Final    Absolute Eosinophils 06/19/2022 0.1  0.0 - 0.5 10*9/L Final    Absolute Basophils 06/19/2022 0.1  0.0 - 0.1 10*9/L Final

## 2022-06-19 NOTE — Unmapped (Signed)
06/19/2022    Rx:Whole Brai: 02/04/2021: 2,340/2,340 cGy  ZO:XWRUEAVWU: 09/06/2021: 2,500/2,500 cGy      Subjective/Assessment/Recommendations:    1. Fatigue: No issues  2. Chemo:  completed  3. Pain: Denies pain  4. Neurologic status:  no issues  5. Psychosocial: Has home support  6. Other: MRI 06/19/22

## 2022-06-26 NOTE — Unmapped (Signed)
Hi,     [name] contacted the Communication Center requesting to speak with the care team of Andre Duncan to discuss:    -[name] stated she is calling back in regards to a missed call from Care Team    Please contact [name]  at [phone] .      Thank you,   Warnell Bureau  [facility]   [phone]

## 2022-06-26 NOTE — Unmapped (Signed)
[facility] Refill Coordination Note    Specialty Medication(s) to be Shipped:   Hematology/Oncology: Imbruvica    Other medication(s) to be shipped: No additional medications requested for fill at this time     Andre Duncan, DOB: [birth year]  Phone: [phone] (home)       All above HIPAA information was verified with patient's family member, spouse.     Was a Nurse, learning disability used for this call? No    Completed refill call assessment today to schedule patient's medication shipment from the [facility] [phone]).  All relevant notes have been reviewed.     Specialty medication(s) and dose(s) confirmed: Regimen is correct and unchanged.   Changes to medications: [name] reports no changes at this time.  Changes to insurance: No  New side effects reported not previously addressed with a pharmacist or physician: None reported  Questions for the pharmacist: No    Confirmed patient received a Conservation officer, historic buildings and a Surveyor, mining with first shipment. The patient will receive a drug information handout for each medication shipped and additional FDA Medication Guides as required.       DISEASE/MEDICATION-SPECIFIC INFORMATION        N/A    SPECIALTY MEDICATION ADHERENCE     Medication Adherence    Patient reported X missed doses in the last month: 0  Specialty Medication: Imbruvica 280mg   Patient is on additional specialty medications: No  Informant: spouse                       Were doses missed due to medication being on hold? No    Imbruvica 280 mg: 14 days of medicine on hand       REFERRAL TO PHARMACIST     Referral to the pharmacist: Not needed      [facility]     Shipping address confirmed in Epic.     Delivery Scheduled: Yes, Expected medication delivery date: 07/11/22.     Medication will be delivered via Next Day Courier to the prescription address in Epic [place].    [name]   [facility] Specialty Technician

## 2022-06-27 NOTE — Unmapped (Signed)
Appts moved

## 2022-06-27 NOTE — Unmapped (Signed)
Where is Elmon Kirschner ok with her [employer]? 10/5 does not have return appts avail    TY  CG

## 2022-07-02 MED ORDER — NICOTINE 14 MG/24 HR DAILY TRANSDERMAL PATCH
MEDICATED_PATCH | TRANSDERMAL | 0 refills | 14 days | Status: CP
Start: 2022-07-02 — End: 2022-07-16

## 2022-07-07 DIAGNOSIS — C8589 Other specified types of non-Hodgkin lymphoma, extranodal and solid organ sites: Principal | ICD-10-CM

## 2022-07-10 ENCOUNTER — Ambulatory Visit: Admit: 2022-07-10 | Payer: PRIVATE HEALTH INSURANCE | Attending: Radiation Oncology | Primary: Radiation Oncology

## 2022-07-10 ENCOUNTER — Ambulatory Visit
Admit: 2022-07-10 | Discharge: 2022-08-05 | Payer: PRIVATE HEALTH INSURANCE | Attending: Radiation Oncology | Primary: Radiation Oncology

## 2022-07-10 ENCOUNTER — Ambulatory Visit: Admit: 2022-07-10 | Discharge: 2022-07-11 | Payer: PRIVATE HEALTH INSURANCE

## 2022-07-10 ENCOUNTER — Ambulatory Visit
Admit: 2022-07-10 | Discharge: 2022-07-11 | Payer: PRIVATE HEALTH INSURANCE | Attending: Radiation Oncology | Primary: Radiation Oncology

## 2022-07-10 ENCOUNTER — Other Ambulatory Visit: Admit: 2022-07-10 | Discharge: 2022-07-11 | Payer: PRIVATE HEALTH INSURANCE

## 2022-07-10 DIAGNOSIS — C8589 Other specified types of non-Hodgkin lymphoma, extranodal and solid organ sites: Principal | ICD-10-CM

## 2022-07-10 LAB — COMPREHENSIVE METABOLIC PANEL
ALBUMIN: 4.1 g/dL (ref 3.4–5.0)
ALKALINE PHOSPHATASE: 52 U/L (ref 46–116)
ALT (SGPT): 12 U/L (ref 10–49)
ANION GAP: 3 mmol/L — ABNORMAL LOW (ref 5–14)
AST (SGOT): 10 U/L (ref <=34)
BILIRUBIN TOTAL: 0.6 mg/dL (ref 0.3–1.2)
BLOOD UREA NITROGEN: 12 mg/dL (ref 9–23)
BUN / CREAT RATIO: 14
CALCIUM: 9.1 mg/dL (ref 8.7–10.4)
CHLORIDE: 110 mmol/L — ABNORMAL HIGH (ref 98–107)
CO2: 27 mmol/L (ref 20.0–31.0)
CREATININE: 0.87 mg/dL
EGFR CKD-EPI (2021) MALE: >90 mL/min/{1.73_m2} (ref >=60)
GLUCOSE RANDOM: 79 mg/dL (ref 70–179)
POTASSIUM: 4.2 mmol/L (ref 3.4–4.8)
PROTEIN TOTAL: 6.8 g/dL (ref 5.7–8.2)
SODIUM: 140 mmol/L (ref 135–145)

## 2022-07-10 LAB — CBC W/ AUTO DIFF
BASOPHILS ABSOLUTE COUNT: 0.1 10*9/L (ref 0.0–0.1)
BASOPHILS RELATIVE PERCENT: 0.7 %
EOSINOPHILS ABSOLUTE COUNT: 0.1 10*9/L (ref 0.0–0.5)
EOSINOPHILS RELATIVE PERCENT: 1 %
HEMATOCRIT: 49.4 % — ABNORMAL HIGH (ref 39.0–48.0)
HEMOGLOBIN: 16.4 g/dL (ref 12.9–16.5)
LYMPHOCYTES ABSOLUTE COUNT: 2.9 10*9/L (ref 1.1–3.6)
LYMPHOCYTES RELATIVE PERCENT: 33.6 %
MEAN CORPUSCULAR HEMOGLOBIN CONC: 33.2 g/dL (ref 32.0–36.0)
MEAN CORPUSCULAR HEMOGLOBIN: 31.1 pg (ref 25.9–32.4)
MEAN CORPUSCULAR VOLUME: 93.8 fL (ref 77.6–95.7)
MEAN PLATELET VOLUME: 8 fL (ref 6.8–10.7)
MONOCYTES ABSOLUTE COUNT: 0.8 10*9/L (ref 0.3–0.8)
MONOCYTES RELATIVE PERCENT: 9.1 %
NEUTROPHILS ABSOLUTE COUNT: 4.7 10*9/L (ref 1.8–7.8)
NEUTROPHILS RELATIVE PERCENT: 55.6 %
PLATELET COUNT: 232 10*9/L (ref 150–450)
RED BLOOD CELL COUNT: 5.26 10*12/L (ref 4.26–5.60)
RED CELL DISTRIBUTION WIDTH: 14.6 % (ref 12.2–15.2)
WBC ADJUSTED: 8.5 10*9/L (ref 3.6–11.2)

## 2022-07-10 MED ADMIN — gadobenate dimeglumine (MULTIHANCE) 529 mg/mL (0.1mmol/0.2mL) solution 15 mL: 15 mL | INTRAVENOUS | @ 15:00:00 | Stop: 2022-07-10

## 2022-07-10 MED FILL — IMBRUVICA 280 MG TABLET: ORAL | 28 days supply | Qty: 56 | Fill #4
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
## 2022-07-11 DIAGNOSIS — C8589 Other specified types of non-Hodgkin lymphoma, extranodal and solid organ sites: Principal | ICD-10-CM

## 2022-07-11 NOTE — Unmapped (Signed)
Message forwarded.

## 2022-07-11 NOTE — Unmapped (Signed)
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  Regimen: MATRIX (Methotrexate, cytarabine, thiotepa, rituximab): started 06/07/20-08/10/20 for 4 cycles --> pr-->single agent methotrexate x 3 cycles --> further decrease in size of masses   Not a transplant candidate due to poor PFTs and active smoking   Whole brain radiation 2340cGy given at 180cGy x 13 from 01/17/21-02/04/21 --> CR  Relapse - progression seen on routine MRI scan in new sites 08/15/2021 , seizure like activity noted 12/1 and keppra started  Regimen #2:  5 fractions of Cyberknife to involved areas (2500cGy) completed 09/06/21  Ibrutinib started 09/06/21--  MRI brain 11/06/21 - significantly improved   MRI 01/09/22 and 03/12/22: No residual enhancing lesions or disease progression; stable T2/flair- may represent posttreatment changes and superimposed chronic small vessel ischemic disease.  MRI 06/2022 -  ? Of new progression versus radiation necrosis.  MRI 07/10/22 -  Stable 0.8cm area of necrosis vs recurrent disase; New 0.4x0.4cm lesion in right parietal lobe    CNS lymphoma:  Unfortunately, Andre Duncan MRI is suspicious for a relapse of his lymphoma and his wife reports that he has had somre more noticeable changes in his cognition. Therefore, I discussed that the prognosis is poor and that we are aiming for disease control with cure being a less likely possibility at this time. He doesn't have much edema so I am not planning to start steroids now but may need to in the future    Stop ibrutinib after we get the lenalidomide but keep it up until then in case it is slowing progression.    Plan on lenalidomide as it has CNS penetration:  This is based on Lenalidomide 20mg  on days 1-21 for 8 cycles and in  responding patients it can be decreased to 20mg  on days 1-21 for another 12 cycles.  The best responses were 18 CR/uCR (40%) and 12 PR (27%) during the induction phase. The maintenance phase was started and completed by 18 and 5 patients, respectively. With a median follow-up of 19.2 months (range 1.5-31), the median progression-free survival (PFS) and overall survival (OS) were 7.8 months (95% CI 3.9-11.3) and 17.7 months (95% CI 12.9 to not reached), respectively. No unexpected toxicity was observed. [email or web address]    Will try to get him set up for CART as a potential option.  CART therapy there is data on 12 relapsed patients with PCNSL who were treated with tisagenlecleucel and followed for a median time of 12.2 months (range, 3.64-23.5). Grade 1 cytokine release syndrome was observed in 7/12 patients (58.3%), low-grade ICANS in 5/12 (41.6%) patients, and only 1 patient experienced grade 3 ICANS. Seven of 12 patients (58.3%) demonstrated response, including a complete response in 6/12 patients (50%).  Overall, tisagenlecleucel was well tolerated and resulted in a sustained remission in 3/7 (42.9%) of initial responders.  [email or web address]    Could consider temodar and palliative care is definitely reasonable option.    This plan was discussed with Dr. Baird Lyons in radiation oncology and she is in agreeement with this plan.    Clarene Critchley, MD  Associate Professor  Division of Hematology      ============  Interval Hx:  His wife reports that he has been a little more confused at times lately.  And his driving has gotten worse so she has started doing that.     Today, 07/10/2022, I independently reviewed the MRI scan and there is a new lesion of concern but not a lot of surrounding edema.  MEDICATIONS:  Current Outpatient Medications   Medication Sig Dispense Refill   ??? albuterol HFA 90 mcg/actuation inhaler Inhale 2-4 puffs every four (4) hours as needed for wheezing. With Spacer 18 g 3   ??? cetirizine (ZYRTEC) 10 MG tablet Take 1 tablet (10 mg total) by mouth daily as needed.     ??? cholecalciferol, vitamin D3-50 mcg, 2,000 unit,, 50 mcg (2,000 unit) tablet Take 1 tablet (50 mcg total) by mouth daily.     ??? droNABinol (MARINOL) 2.5 MG capsule Take 2 capsules (5 mg total) by mouth Two (2) times a day (30 minutes before a meal). 240 capsule 0   ??? escitalopram oxalate (LEXAPRO) 20 MG tablet Take 1 tablet (20 mg total) by mouth daily. 90 tablet 2   ??? finasteride (PROSCAR) 5 mg tablet Take 1 tablet (5 mg total) by mouth daily.     ??? fish oil/borage/flax/om3,6,9 1 (OMEGA 3-6-9 COMPLEX ORAL) Take 1 capsule by mouth daily.     ??? fluoride, sodium, 1.1 % Gel Brush teeth with a pea-sized amount of the paste for 2 min and spit.  No rinsing. Do not eat or drink for 30 minutes after use. Use 2x/day. 56 g prn   ??? glycopyrrolate-formoteroL (BEVESPI AEROSPHERE) 9-4.8 mcg HFAA inhaler Inhale 2 puffs Two (2) times a day. Use with spacer. 10.7 g 6   ??? ibrutinib (IMBRUVICA) 280 mg tablet Take 2 tablets (560 mg total) by mouth daily. 56 tablet 11   ??? ipratropium-albuteroL (DUO-NEB) 0.5-2.5 mg/3 mL nebulizer Inhale 3 mL by nebulization every six (6) hours as needed (wheezing, shortness of breath). 270 mL 11   ??? memantine (NAMENDA) 10 MG tablet Take 1 tablet (10 mg total) by mouth Two (2) times a day. 60 tablet 5   ??? metFORMIN (GLUCOPHAGE) 500 MG tablet Take 2 tablets (1,000 mg total) by mouth in the morning and 2 tablets (1,000 mg total) in the evening. Take with meals. 180 tablet 3   ??? mirtazapine (REMERON) 30 MG tablet Take 1 tablet by mouth nightly 90 tablet 0   ??? multivitamin (TAB-A-VITE/THERAGRAN) per tablet Take 1 tablet by mouth daily.     ??? nicotine (NICODERM CQ) 14 mg/24 hr patch Place 1 patch on the skin daily for 14 days. 14 mg patch daily for two weeks 14 patch 0   ??? [START ON 07/16/2022] nicotine (NICODERM CQ) 7 mg/24 hr patch Place 1 patch on the skin daily for 14 days. 7 mg daily for two week 14 patch 0   ??? nicotine polacrilex (NICORETTE) 4 MG gum Apply 1 each (4 mg total) to cheek every hour as needed for smoking cessation. Use according to the following 12-week dosing schedule: Weeks 1 to 6: Chew 1 piece of gum every 1 to 2 hours (maximum: 24 pieces/day); to increase chances of quitting, chew at least 9 pieces/day during the first 6 weeks. Weeks 7 to 9: Chew 1 piece of gum every 2 to 4 hours (maximum: 24 pieces/day). Weeks 10 to 12: Chew 1 piece of gum every 4 to 8 hours (maximum: 24 pieces/day). 100 each 11   ??? OLANZapine (ZYPREXA) 5 MG tablet Take 1 tablet by mouth nightly 90 tablet 0   ??? levETIRAcetam (KEPPRA) 500 MG tablet Take 1 tablet (500 mg total) by mouth Two (2) times a day. 60 tablet 4   ??? pantoprazole (PROTONIX) 40 MG tablet Take 1 tablet (40 mg total) by mouth daily. (Patient not taking: Reported on 06/19/2022) 30 tablet 1  No current facility-administered medications for this visit.         VITAL SIGNS:   Vitals:    07/10/22 1232   BP: 119/78   Pulse: 67   Resp: 17   Temp: 37 ??C (98.6 ??F)   TempSrc: Temporal   SpO2: 98%   Weight: 79.9 kg (176 lb 2.4 oz)   Height: 184.2 cm (6' 0.52)       Physical Exam  Eyes:      Extraocular Movements: Extraocular movements intact.   Abdominal:      Palpations: Abdomen is soft.      Tenderness: There is no abdominal tenderness.   Lymphadenopathy:      Cervical: No cervical adenopathy.      Upper Body:      Right upper body: No supraclavicular or axillary adenopathy.      Left upper body: No supraclavicular or axillary adenopathy.   Neurological:      General: No focal deficit present.      Mental Status: He is alert.      Motor: No weakness.      Comments: He can tell me the year.  He follows the conversation and answers questions appropriately   Psychiatric:         Mood and Affect: Mood normal.         Behavior: Behavior normal.         LABORATORY:  Lab on 07/10/2022   Component Date Value Ref Range Status   ??? Sodium 07/10/2022 140  135 - 145 mmol/L Final   ??? Potassium 07/10/2022 4.2  3.4 - 4.8 mmol/L Final   ??? Chloride 07/10/2022 110 (H)  98 - 107 mmol/L Final   ??? CO2 07/10/2022 27.0  20.0 - 31.0 mmol/L Final   ??? Anion Gap 07/10/2022 3 (L)  5 - 14 mmol/L Final   ??? BUN 07/10/2022 12  9 - 23 mg/dL Final   ??? Creatinine 07/10/2022 0.87  0.60 - 1.10 mg/dL Final   ??? BUN/Creatinine Ratio 07/10/2022 14   Final   ??? eGFR CKD-EPI (2021) Male 07/10/2022 >90  >=60 mL/min/1.55m2 Final    eGFR calculated with CKD-EPI 2021 equation in accordance with [employer] and [employer] of Nephrology Task Force recommendations.   ??? Glucose 07/10/2022 79  70 - 179 mg/dL Final   ??? Calcium 16/07/9603 9.1  8.7 - 10.4 mg/dL Final   ??? Albumin 54/06/8118 4.1  3.4 - 5.0 g/dL Final   ??? Total Protein 07/10/2022 6.8  5.7 - 8.2 g/dL Final   ??? Total Bilirubin 07/10/2022 0.6  0.3 - 1.2 mg/dL Final   ??? AST 14/78/2956 10  <=34 U/L Final   ??? ALT 07/10/2022 12  10 - 49 U/L Final   ??? Alkaline Phosphatase 07/10/2022 52  46 - 116 U/L Final   ??? WBC 07/10/2022 8.5  3.6 - 11.2 10*9/L Final   ??? RBC 07/10/2022 5.26  4.26 - 5.60 10*12/L Final   ??? HGB 07/10/2022 16.4  12.9 - 16.5 g/dL Final   ??? HCT 21/30/8657 49.4 (H)  39.0 - 48.0 % Final   ??? MCV 07/10/2022 93.8  77.6 - 95.7 fL Final   ??? MCH 07/10/2022 31.1  25.9 - 32.4 pg Final   ??? MCHC 07/10/2022 33.2  32.0 - 36.0 g/dL Final   ??? RDW 84/69/6295 14.6  12.2 - 15.2 % Final   ??? MPV 07/10/2022 8.0  6.8 - 10.7 fL Final   ??? Platelet 07/10/2022  232  150 - 450 10*9/L Final   ??? Neutrophils % 07/10/2022 55.6  % Final   ??? Lymphocytes % 07/10/2022 33.6  % Final   ??? Monocytes % 07/10/2022 9.1  % Final   ??? Eosinophils % 07/10/2022 1.0  % Final   ??? Basophils % 07/10/2022 0.7  % Final   ??? Absolute Neutrophils 07/10/2022 4.7  1.8 - 7.8 10*9/L Final   ??? Absolute Lymphocytes 07/10/2022 2.9  1.1 - 3.6 10*9/L Final   ??? Absolute Monocytes 07/10/2022 0.8  0.3 - 0.8 10*9/L Final   ??? Absolute Eosinophils 07/10/2022 0.1  0.0 - 0.5 10*9/L Final   ??? Absolute Basophils 07/10/2022 0.1  0.0 - 0.1 10*9/L Final

## 2022-07-11 NOTE — Unmapped (Signed)
Hi,     [name] contacted the [employer] regarding the following:    - States that she is calling to request that Dr. Elmon Kirschner review the MRI from yesterday to see if there is a new lesion, requesting call when MRI is reviewed    Please contact [name] at [phone].    Thanks in advance,    Rosary Lively  [facility]   [phone]

## 2022-07-12 NOTE — Unmapped (Signed)
RADIATION ONCOLOGY FOLLOW UP NOTE     Encounter Date: 07/10/22  Patient Name: Andre Duncan  Medical Record Number: [id number]    Referring Physician: Dr. 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    Although prior area appears stable as radiation necrosis, we reviewed this new area with Dr. Elmon Duncan and our neuro team and are concerned for progression (although not certain).    PLAN:  Dr. Elmon Duncan to switch therapies given likely progression   Close monitoring with interval imaging. Discussed that we can consider radiation again in future should it be warranted    --------------------------    Interval History:  He is seen with his wife. She notes that on windy roads, he has trouble staying in lane but he notes feeling neurologically OK otherwise. No focal deficits. Seeing neuro for formal visit next week.     MRI brain today -    *New enhancing lesion posterior to the right lateral ventricle on the right parietal lobe measuring 0.4 x 0.4 cm.   *No significant change in the 0.8 cm enhancing focus in the right parafalcine parietal lobe may represent sequela of previously treated focus of disease with radiation necrosis versus recurrent disease. Attention on follow-up is recommended.       Oncology History   Primary CNS lymphoma (CMS-HCC)   11/28/2020 Initial Diagnosis    Primary CNS lymphoma (CMS-HCC)     11/30/2020 - 11/30/2020 Chemotherapy    IP LYMPHOMA HIGH-DOSE METHOTREXATE  riTUXimab 375 mg/m2 (standard and rapid infusion) IV on day 1, vinCRIStine 1.4 mg/m2 (max 2 mg) IV on day 1, DOXOrubicin 50 mg/m2 IV on day 1, cyclophosphamide 750 mg/m2 IV on day 1, predniSONE 100 mg PO on days 1-5, methotrexate (high-dose) IV on Day 15 (even cycles), every 21-day.     01/03/2021 -  Radiation    Radiation Therapy Treatment Details (Noted on 01/03/2021)  Site: Midline Brain  Technique: 3D CRT  Goal: No goal specified  Planned Treatment Start Date: No planned start date specified     08/23/2021 -  Radiation    Radiation Therapy Treatment Details (Noted on 08/23/2021)  Site: Bilateral Brain  Technique: SRS  Goal: No goal specified  Planned Treatment Start Date: No planned start date specified       ROS:  A 10 systems was negative except for pertinent positives noted in HPI.    Physical Examination:   CONSTITUTIONAL:   Well appearing  EYES:  Sclera anicteric, EOMI  Ears, Nose, Mouth, Throat: Facial movements full and symmetric, tongue midline.    NECK:  Supple   CV:  Regular rate   RESPIRATORY: Breathing comfortably   MUSCULOSKELETAL: Independent gai   PSYCHIATRIC:  Mood is normal.    EXTREMITIES:     No edema  NEUROLOGIC:  Oriented x 3. CN II-XII grossly intact. No abnormal cerebellar signs.     Izabellah Dadisman L. Baird Lyons, MD  Assistant Professor  Department of Radiation Oncology

## 2022-07-14 DIAGNOSIS — C8589 Other specified types of non-Hodgkin lymphoma, extranodal and solid organ sites: Principal | ICD-10-CM

## 2022-07-16 ENCOUNTER — Institutional Professional Consult (permissible substitution): Admit: 2022-07-16 | Discharge: 2022-07-17 | Payer: PRIVATE HEALTH INSURANCE | Attending: Oncology | Primary: Oncology

## 2022-07-16 DIAGNOSIS — C8589 Other specified types of non-Hodgkin lymphoma, extranodal and solid organ sites: Principal | ICD-10-CM

## 2022-07-16 MED ORDER — LENALIDOMIDE 20 MG CAPSULE
ORAL_CAPSULE | 0 refills | 0 days | Status: CP
Start: 2022-07-16 — End: ?

## 2022-07-16 MED ORDER — NICOTINE 7 MG/24 HR DAILY TRANSDERMAL PATCH
MEDICATED_PATCH | TRANSDERMAL | 0 refills | 14 days | Status: CP
Start: 2022-07-16 — End: 2022-07-30

## 2022-07-16 NOTE — Unmapped (Signed)
Clinical Pharmacist Practitioner: Lymphoma Clinic       Patient Name: Andre Duncan  Patient Age: 56 y.o.  Encounter Date: 07/16/2022    Initial reason for consult: Revlimid education and REMS enrollment  Referred by: Dr. Elmon Kirschner    Chemotherapy regimen: Revlimid and Rituximab (R2)  - Rituximab 375 mg/m2 IV D1 Q28D  - Revlimid 20 mg PO daily, days 1-21/28    Current place in therapy is Pre-Treatment.     Start date: Pending access to Revlimid    Subjective/Objective:  Andre Duncan was contacted today via telephone for Revlimid education and REMS enrollment prior to initiation for treatment of relapsed primary CNS lymphoma (PCNSL).    Revlimid REMS Education  Patient was educated today regarding Revlimid REMS program and verbalizes understanding that:  Revlimid can cause severe birth defects or death to their unborn baby if they have sex with a male who is pregnant or who is able to get pregnant during their treatment  Their semen may contain Revlimid even after they stop treatment. They must use a latex or synthetic condom every time they have sex with a male who is pregnant or who is able to get pregnant while taking Revlimid, during breaks (dose interruptions), and for 4 weeks after stopping Revlimid  Not having sex is the only birth control method that is 100% effective  The Revlimid prescription is only for them and is not to be shared with others  They were provided the Revlimid Patient Medication Guide  This information was provided to them by a healthcare provider and their questions have been answered  They must use a latex or synthetic condom every time they have sex with a male who is pregnant or who is able to get pregnant, even if I have had a successful vasectomy (tying of the tubes to prevent the passing of sperm)  Patient confirms that they will:  Use a latex or synthetic condom every time they have sex with a male who is pregnant or who is able to get pregnant:  While taking Revlimid  During breaks (dose interruptions)  For 4 weeks after stopping Revlimid  Call their healthcare provider right away if:  They have unprotected sex with a male who is pregnant or who is able to get pregnant  They think--for any reason-- that their sexual partner is pregnant or may be pregnant  Their partner becomes pregnant or thinks she may be pregnant  If their healthcare provider is not available they will call the [facility] at [phone]  Complete the mandatory confidential monthly survey while taking Revlimid  Keep their Revlimid prescription out of the reach of children  Return any unused Revlimid capsules for disposal to [employer] by calling [phone]. [employer] will pay for the shipping costs.  Patient confirms that they will NOT:  Share their Revlimid capsules with anyone even if they have symptoms like theirs  Donate blood or sperm while taking Revlimid, during breaks (dose interruptions) and for 4 weeks after stopping Revlimid    Revlimid Education  Lenalidomide (Revlimid)    Medication & Administration   Dosage: 20 mg PO daily, days 1-21/28    Administration:   Administer at about the same time each day with water; administer with or without food. Swallow capsule whole; do not break, open, or chew.  Adherence/Missed dose instruction:   May administer a missed dose if within 12 hours of usual dosing time. If greater than 12 hours, patient should skip dose for  that day and resume usual dosing the following day. Patient should not take 2 doses to make up for a missed dose.  Side Effects & Monitoring Parameters:   Hematologic & oncologic: Thrombocytopenia (24% to 62%; grades 3/4: 13% to 50%), neutropenia (49% to 61%; grades 3/4: 43% to 54%), leukopenia (8% to 32%; grades 3/4: 5% to 24%), anemia (MCL: 31%, grades 3/4: 11%, MDS, MM maintenance: 9% to 12%, grades 3/4: 4% to 6%)  Central nervous system: Fatigue (11% to 34%), dizziness (20%), headache (9% to 20%), paresthesia (13%)  Dermatologic: Pruritus (MDS: 42%; MCL: 17%), skin rash (8% to 36%), xeroderma (?11%)  Gastrointestinal: Diarrhea (MM maintenance, MDS: 39% to 49%; MCL: 31%), nausea (11% to 30%), constipation (13% to 24%), gastroenteritis (23%), decreased appetite (14%), abdominal pain (10% to 12%), vomiting (6% to 12%)  Neuromuscular & skeletal: Muscle spasm (MM maintenance: 33%; MCL: 13%), asthenia (14% to 30%), arthralgia (MDS: 22%; MCL: 8%), back pain (13% to 21%), muscle cramps (18%), limb pain (11%)  CBC with differential (MCL - weekly for the first cycle, every 2 weeks during cycles 2 to 4; MDS - weekly for first 8 weeks; Multiple myeloma - weekly for the first 2 cycles, every 2 weeks during the third cycle; follicular and marginal zone lymphoma - weekly for the first 3 weeks in cycle 1 and every 2 weeks in cycles 2 to 4), then monthly thereafter;   Serum creatinine, liver function tests, thyroid function tests (TSH at baseline then every 2 to 3 months during lenalidomide treatment; ECG when clinically indicated; monitor for signs and symptoms of infection (if neutropenic), secondary malignancies, thromboembolism, dermatologic toxicity, tumor lysis syndrome, or tumor flare reaction.  Females of reproductive potential: Pregnancy test 10 to 14 days and 24 hours prior to initiating therapy, weekly during the first 4 weeks of treatment, then every 2 to 4 weeks through 4 weeks after therapy discontinued.  Warnings & Precautions:   Black Box Warnings   Hematologic toxicity (neutropenia and thrombocytopenia) occurs in a majority of patients (grade 3/4: 80% in patients with del 5q myelodysplastic syndrome) and may require dose reductions and/or delays; the use of blood product support and/or growth factors may be needed.  Thromboembolic events: Lenalidomide has been associated with a significant increase in risk for arterial and venous thromboembolic events in multiple myeloma patients treated with lenalidomide and dexamethasone combination therapy. Deep vein thrombosis (DVT), pulmonary embolism (PE), myocardial infarction, and stroke have occurred; monitor for signs and symptoms of thromboembolism (shortness of breath, chest pain, or arm or leg swelling) and instruct patients to seek prompt medical attention with development of these symptoms. Thromboprophylaxis is recommended; the choice of regimen should be based on an assessment of the patient's underlying risk factors.  Pregnancy: Do not use lenalidomide in pregnant females. Lenalidomide is an analogue of thalidomide (a human teratogen) and could potentially cause severe birth defects or embryo-fetal death; use is contraindicated during pregnancy and pregnancy must be avoided while taking lenalidomide.   Drug/Food Interactions:   Food: In the trials where the efficacy and safety were established for Lenalidomide, the drug was administered without regard to food intake.  Drug-Drug: Lenalidomide is a substrate of P-glycoprotein/ABCB1 and should be avoided in combination with inhibitors/inducers of P-glycoprotein/ABCB1.  Storage, Handling Precautions, & Disposal:   Store at 20??C to 25??C (68??F to 77??F); excursions permitted to 15??C and 30??C (59??F and 86??F).      Assessment:  Mr./Ms. Andre Duncan is a 56 y.o. male with relapsed PCNSL who  is seen in consultation at the request of Dr. Elmon Kirschner for Revlimid education and REMS enrollment. Patient verbalized understanding of Revlimid REMS program and agrees to comply with program rules. He also verbalized understanding of dosing, administration, and potential ADEs secondary to Revlimid. He has been enrolled in the REMS program and benefits investigation has started. Plan to start ASAP, pending medication access. Will follow up with patient virtually 1-2 weeks following initiation for chemotherapy monitoring. Patient verbalized understanding of plan.         The patient reports they are currently: at home. I spent 24 minutes on the phone with the patient on the date of service. I spent an additional 10 minutes on pre- and post-visit activities on the date of service.     The patient was physically located in [place] or a state in which I am permitted to provide care. The patient and/or parent/guardian understood that s/he may incur co-pays and cost sharing, and agreed to the telemedicine visit. The visit was reasonable and appropriate under the circumstances given the patient's presentation at the time.    The patient and/or parent/guardian has been advised of the potential risks and limitations of this mode of treatment (including, but not limited to, the absence of in-person examination) and has agreed to be treated using telemedicine. The patient's/patient's family's questions regarding telemedicine have been answered.     If the visit was completed in an ambulatory setting, the patient and/or parent/guardian has also been advised to contact their provider???s office for worsening conditions, and seek emergency medical treatment and/or call 911 if the patient deems either necessary.    ................    Plan and Recommendations:  PCNSL  - Start Revlimid 20 mg PO daily, days 1-21/28 pending medication access    2. VTE Prophylaxis  - Start Aspirin 81 mg PO daily once Revlimid is started    Follow up:  - 07/18/22 with Dr. Elmon Kirschner    I spent 24 minutes in direct patient care.    [place] Jeane Cashatt, PharmD, BCPS, BCOP, CPP  Clinical Pharmacist Practitioner, Myeloma and Lymphoma  Pager: [phone]      History of Present Illness:  We had the pleasure of seeing Andre Duncan in the Lymphoma Clinic at the [place] [facility] [place] [facility] on 07/16/2022.     His/her oncologic history is as follows:    Oncology History   Primary CNS lymphoma (CMS-HCC)   11/28/2020 Initial Diagnosis    Primary CNS lymphoma (CMS-HCC)     11/30/2020 - 11/30/2020 Chemotherapy    IP LYMPHOMA HIGH-DOSE METHOTREXATE  riTUXimab 375 mg/m2 (standard and rapid infusion) IV on day 1, vinCRIStine 1.4 mg/m2 (max 2 mg) IV on day 1, DOXOrubicin 50 mg/m2 IV on day 1, cyclophosphamide 750 mg/m2 IV on day 1, predniSONE 100 mg PO on days 1-5, methotrexate (high-dose) IV on Day 15 (even cycles), every 21-day.     01/03/2021 -  Radiation    Radiation Therapy Treatment Details (Noted on 01/03/2021)  Site: Midline Brain  Technique: 3D CRT  Goal: No goal specified  Planned Treatment Start Date: No planned start date specified     08/23/2021 -  Radiation    Radiation Therapy Treatment Details (Noted on 08/23/2021)  Site: Bilateral Brain  Technique: SRS  Goal: No goal specified  Planned Treatment Start Date: No planned start date specified           MEDICATIONS:  Current Outpatient Medications   Medication Sig Dispense Refill    albuterol HFA  90 mcg/actuation inhaler Inhale 2-4 puffs every four (4) hours as needed for wheezing. With Spacer 18 g 3    cetirizine (ZYRTEC) 10 MG tablet Take 1 tablet (10 mg total) by mouth daily as needed.      cholecalciferol, vitamin D3-50 mcg, 2,000 unit,, 50 mcg (2,000 unit) tablet Take 1 tablet (50 mcg total) by mouth daily.      droNABinol (MARINOL) 2.5 MG capsule Take 2 capsules (5 mg total) by mouth Two (2) times a day (30 minutes before a meal). 240 capsule 0    escitalopram oxalate (LEXAPRO) 20 MG tablet Take 1 tablet (20 mg total) by mouth daily. 90 tablet 2    finasteride (PROSCAR) 5 mg tablet Take 1 tablet (5 mg total) by mouth daily.      fish oil/borage/flax/om3,6,9 1 (OMEGA 3-6-9 COMPLEX ORAL) Take 1 capsule by mouth daily.      fluoride, sodium, 1.1 % Gel Brush teeth with a pea-sized amount of the paste for 2 min and spit.  No rinsing. Do not eat or drink for 30 minutes after use. Use 2x/day. 56 g prn    glycopyrrolate-formoteroL (BEVESPI AEROSPHERE) 9-4.8 mcg HFAA inhaler Inhale 2 puffs Two (2) times a day. Use with spacer. 10.7 g 6    ibrutinib (IMBRUVICA) 280 mg tablet Take 2 tablets (560 mg total) by mouth daily. 56 tablet 11    ipratropium-albuteroL (DUO-NEB) 0.5-2.5 mg/3 mL nebulizer Inhale 3 mL by nebulization every six (6) hours as needed (wheezing, shortness of breath). 270 mL 11    levETIRAcetam (KEPPRA) 500 MG tablet Take 1 tablet (500 mg total) by mouth Two (2) times a day. 60 tablet 4    memantine (NAMENDA) 10 MG tablet Take 1 tablet (10 mg total) by mouth Two (2) times a day. 60 tablet 5    metFORMIN (GLUCOPHAGE) 500 MG tablet Take 2 tablets (1,000 mg total) by mouth in the morning and 2 tablets (1,000 mg total) in the evening. Take with meals. 180 tablet 3    mirtazapine (REMERON) 30 MG tablet Take 1 tablet by mouth nightly 90 tablet 0    multivitamin (TAB-A-VITE/THERAGRAN) per tablet Take 1 tablet by mouth daily.      nicotine (NICODERM CQ) 14 mg/24 hr patch Place 1 patch on the skin daily for 14 days. 14 mg patch daily for two weeks 14 patch 0    nicotine (NICODERM CQ) 7 mg/24 hr patch Place 1 patch on the skin daily for 14 days. 7 mg daily for two week 14 patch 0    nicotine polacrilex (NICORETTE) 4 MG gum Apply 1 each (4 mg total) to cheek every hour as needed for smoking cessation. Use according to the following 12-week dosing schedule: Weeks 1 to 6: Chew 1 piece of gum every 1 to 2 hours (maximum: 24 pieces/day); to increase chances of quitting, chew at least 9 pieces/day during the first 6 weeks. Weeks 7 to 9: Chew 1 piece of gum every 2 to 4 hours (maximum: 24 pieces/day). Weeks 10 to 12: Chew 1 piece of gum every 4 to 8 hours (maximum: 24 pieces/day). 100 each 11    OLANZapine (ZYPREXA) 5 MG tablet Take 1 tablet by mouth nightly 90 tablet 0    pantoprazole (PROTONIX) 40 MG tablet Take 1 tablet (40 mg total) by mouth daily. (Patient not taking: Reported on 06/19/2022) 30 tablet 1     No current facility-administered medications for this visit.        ALLERGIES:  Allergies   Allergen Reactions    Sulfa (Sulfonamide Antibiotics) Rash     Developed during hospitalization d/c     Tegaderm Adhesive-No Drug-Allergy Check Other (See Comments) LABORATORY DATA:  CMP  No results for input(s): NA, K, CL, CO2, BUN, CREATININE, GLU, MG, CALCIUM, PHOS, AST, ALT, ALKPHOS in the last 72 hours.    Complete Blood Count No results for input(s): ABORH, WBC, RBC, HGB, HCT, MCV, MCH, MCHC, PLT, MPV, RDWCV in the last 72 hours.    Invalid input(s): ABO, RH    Differential (Absolute) No results for input(s): DIFFTYPE, SEGSABS, NEUTROABS, LYMPHSABS, MONOSABS, EOSABS, BASOSABS, METAABS, MYELOABS, PROMYELOABS, BLASTSABS, NRBC in the last 72 hours.    Invalid input(s): ABSBAND, ABSOTHER

## 2022-07-17 DIAGNOSIS — C8589 Other specified types of non-Hodgkin lymphoma, extranodal and solid organ sites: Principal | ICD-10-CM

## 2022-07-18 ENCOUNTER — Ambulatory Visit
Admit: 2022-07-18 | Discharge: 2022-07-18 | Payer: PRIVATE HEALTH INSURANCE | Attending: Student in an Organized Health Care Education/Training Program | Primary: Student in an Organized Health Care Education/Training Program

## 2022-07-18 ENCOUNTER — Other Ambulatory Visit: Admit: 2022-07-18 | Discharge: 2022-07-18 | Payer: PRIVATE HEALTH INSURANCE

## 2022-07-18 ENCOUNTER — Ambulatory Visit: Admit: 2022-07-18 | Discharge: 2022-07-18 | Payer: PRIVATE HEALTH INSURANCE

## 2022-07-18 DIAGNOSIS — R569 Unspecified convulsions: Principal | ICD-10-CM

## 2022-07-18 DIAGNOSIS — C8589 Other specified types of non-Hodgkin lymphoma, extranodal and solid organ sites: Principal | ICD-10-CM

## 2022-07-18 LAB — CBC W/ AUTO DIFF
BASOPHILS ABSOLUTE COUNT: 0.1 10*9/L (ref 0.0–0.1)
BASOPHILS RELATIVE PERCENT: 0.4 %
EOSINOPHILS ABSOLUTE COUNT: 0.1 10*9/L (ref 0.0–0.5)
EOSINOPHILS RELATIVE PERCENT: 0.5 %
HEMATOCRIT: 50.1 % — ABNORMAL HIGH (ref 39.0–48.0)
HEMOGLOBIN: 17.1 g/dL — ABNORMAL HIGH (ref 12.9–16.5)
LYMPHOCYTES ABSOLUTE COUNT: 2.6 10*9/L (ref 1.1–3.6)
LYMPHOCYTES RELATIVE PERCENT: 21.9 %
MEAN CORPUSCULAR HEMOGLOBIN CONC: 34.3 g/dL (ref 32.0–36.0)
MEAN CORPUSCULAR HEMOGLOBIN: 32 pg (ref 25.9–32.4)
MEAN CORPUSCULAR VOLUME: 93.5 fL (ref 77.6–95.7)
MEAN PLATELET VOLUME: 8 fL (ref 6.8–10.7)
MONOCYTES ABSOLUTE COUNT: 1 10*9/L — ABNORMAL HIGH (ref 0.3–0.8)
MONOCYTES RELATIVE PERCENT: 8.6 %
NEUTROPHILS ABSOLUTE COUNT: 8.2 10*9/L — ABNORMAL HIGH (ref 1.8–7.8)
NEUTROPHILS RELATIVE PERCENT: 68.6 %
PLATELET COUNT: 221 10*9/L (ref 150–450)
RED BLOOD CELL COUNT: 5.35 10*12/L (ref 4.26–5.60)
RED CELL DISTRIBUTION WIDTH: 14 % (ref 12.2–15.2)
WBC ADJUSTED: 12 10*9/L — ABNORMAL HIGH (ref 3.6–11.2)

## 2022-07-18 LAB — COMPREHENSIVE METABOLIC PANEL
ALBUMIN: 4 g/dL (ref 3.4–5.0)
ALKALINE PHOSPHATASE: 59 U/L (ref 46–116)
ALT (SGPT): 13 U/L (ref 10–49)
ANION GAP: 5 mmol/L (ref 5–14)
AST (SGOT): 12 U/L (ref <=34)
BILIRUBIN TOTAL: 0.6 mg/dL (ref 0.3–1.2)
BLOOD UREA NITROGEN: 10 mg/dL (ref 9–23)
BUN / CREAT RATIO: 11
CALCIUM: 9.1 mg/dL (ref 8.7–10.4)
CHLORIDE: 109 mmol/L — ABNORMAL HIGH (ref 98–107)
CO2: 26 mmol/L (ref 20.0–31.0)
CREATININE: 0.87 mg/dL
EGFR CKD-EPI (2021) MALE: >90 mL/min/{1.73_m2} (ref >=60)
GLUCOSE RANDOM: 87 mg/dL (ref 70–179)
POTASSIUM: 4.3 mmol/L (ref 3.4–4.8)
PROTEIN TOTAL: 7.2 g/dL (ref 5.7–8.2)
SODIUM: 140 mmol/L (ref 135–145)

## 2022-07-18 MED ORDER — MEMANTINE 10 MG TABLET
ORAL_TABLET | Freq: Two times a day (BID) | ORAL | 4 refills | 30 days | Status: CP
Start: 2022-07-18 — End: ?

## 2022-07-18 NOTE — Unmapped (Addendum)
 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  Regimen: MATRIX (Methotrexate, cytarabine, thiotepa, rituximab): started 06/07/20-08/10/20 for 4 cycles --> pr-->single agent methotrexate x 3 cycles --> further decrease in size of masses   Not a transplant candidate due to poor PFTs and active smoking   Whole brain radiation 2340cGy given at 180cGy x 13 from 01/17/21-02/04/21 --> CR  Relapse - progression seen on routine MRI scan in new sites 08/15/2021 , seizure like activity noted 12/1 and keppra started  Regimen #2:  5 fractions of Cyberknife to involved areas (2500cGy) completed 09/06/21  Ibrutinib started 09/06/21--  MRI brain 11/06/21 - significantly improved   MRI 01/09/22 and 03/12/22: No residual enhancing lesions or disease progression; stable T2/flair- may represent posttreatment changes and superimposed chronic small vessel ischemic disease.  MRI 06/2022 -  ? Of new progression versus radiation necrosis.  MRI 07/10/22 -  Stable 0.8cm area of necrosis vs recurrent disase; New 0.4x0.4cm lesion in right parietal lobe    CNS lymphoma:  Plan on lenalidomide and rituximab as it has CNS penetration:  This is based on Lenalidomide 20mg  on days 1-21 for 8 cycles and in  responding patients it can be decreased to 20mg  on days 1-21 for another 12 cycles.  The best responses were 18 CR/uCR (40%) and 12 PR (27%) during the induction phase. The maintenance phase was started and completed by 18 and 5 patients, respectively. With a median follow-up of 19.2 months (range 1.5-31), the median progression-free survival (PFS) and overall survival (OS) were 7.8 months (95% CI 3.9-11.3) and 17.7 months (95% CI 12.9 to not reached), respectively. No unexpected toxicity was observed. [email or web address].  Once he has the lenalidomide, he will stop the BTKi and start lenalidomide 2 days later.    Will try to get him set up for CART as a potential option.  CART therapy there is data on 12 relapsed patients with PCNSL who were treated with tisagenlecleucel and followed for a median time of 12.2 months (range, 3.64-23.5). Grade 1 cytokine release syndrome was observed in 7/12 patients (58.3%), low-grade ICANS in 5/12 (41.6%) patients, and only 1 patient experienced grade 3 ICANS. Seven of 12 patients (58.3%) demonstrated response, including a complete response in 6/12 patients (50%).  Overall, tisagenlecleucel was well tolerated and resulted in a sustained remission in 3/7 (42.9%) of initial responders.  [email or web address].  It is not FDA approved for [place] in CNS lymphoma but he may be a candidate for our [facility] 1813 trial which is a CD19CAR with an inducible caspase 9 on off switch. This is very appealing because I worry that he could be at increased risk of neurotoxicity given his hx of CNS lymphoma and radiation.  I will check to see if his lymphoma is kappa epxressing,w hich would open the option for another CART trial.      Clarene Critchley, MD  Associate Professor  Division of Hematology      ============  Interval Hx:  Since I saw him last he has not had any new cognitive issues.     MEDICATIONS:  Current Outpatient Medications   Medication Sig Dispense Refill   ??? albuterol HFA 90 mcg/actuation inhaler Inhale 2-4 puffs every four (4) hours as needed for wheezing. With Spacer 18 g 3   ??? cetirizine (ZYRTEC) 10 MG tablet Take 1 tablet (10 mg total) by mouth daily as needed.     ??? cholecalciferol, vitamin D3-50 mcg, 2,000 unit,, 50 mcg (2,000 unit) tablet Take 1 tablet (  50 mcg total) by mouth daily.     ??? droNABinol (MARINOL) 2.5 MG capsule Take 2 capsules (5 mg total) by mouth Two (2) times a day (30 minutes before a meal). 240 capsule 0   ??? escitalopram oxalate (LEXAPRO) 20 MG tablet Take 1 tablet (20 mg total) by mouth daily. 90 tablet 2   ??? finasteride (PROSCAR) 5 mg tablet Take 1 tablet (5 mg total) by mouth daily.     ??? fish oil/borage/flax/om3,6,9 1 (OMEGA 3-6-9 COMPLEX ORAL) Take 1 capsule by mouth daily.     ??? fluoride, sodium, 1.1 % Gel Brush teeth with a pea-sized amount of the paste for 2 min and spit.  No rinsing. Do not eat or drink for 30 minutes after use. Use 2x/day. 56 g prn   ??? glycopyrrolate-formoteroL (BEVESPI AEROSPHERE) 9-4.8 mcg HFAA inhaler Inhale 2 puffs Two (2) times a day. Use with spacer. 10.7 g 6   ??? ibrutinib (IMBRUVICA) 280 mg tablet Take 2 tablets (560 mg total) by mouth daily. 56 tablet 11   ??? metFORMIN (GLUCOPHAGE) 500 MG tablet Take 2 tablets (1,000 mg total) by mouth in the morning and 2 tablets (1,000 mg total) in the evening. Take with meals. 180 tablet 3   ??? mirtazapine (REMERON) 30 MG tablet Take 1 tablet by mouth nightly 90 tablet 0   ??? multivitamin (TAB-A-VITE/THERAGRAN) per tablet Take 1 tablet by mouth daily.     ??? OLANZapine (ZYPREXA) 5 MG tablet Take 1 tablet by mouth nightly 90 tablet 0   ??? ipratropium-albuteroL (DUO-NEB) 0.5-2.5 mg/3 mL nebulizer Inhale 3 mL by nebulization every six (6) hours as needed (wheezing, shortness of breath). (Patient not taking: Reported on 07/18/2022) 270 mL 11   ??? lenalidomide (REVLIMID) 20 mg cap capsule Take 1 capsule by mouth daily for days 1-21 followed by 7 days rest (Patient not taking: Reported on 07/18/2022) 21 capsule 0   ??? levETIRAcetam (KEPPRA) 500 MG tablet Take 1 tablet (500 mg total) by mouth Two (2) times a day. 60 tablet 4   ??? memantine (NAMENDA) 10 MG tablet Take 1 tablet (10 mg total) by mouth Two (2) times a day. 60 tablet 4   ??? nicotine (NICODERM CQ) 7 mg/24 hr patch Place 1 patch on the skin daily for 14 days. 7 mg daily for two week (Patient not taking: Reported on 07/18/2022) 14 patch 0   ??? nicotine polacrilex (NICORETTE) 4 MG gum Apply 1 each (4 mg total) to cheek every hour as needed for smoking cessation. Use according to the following 12-week dosing schedule: Weeks 1 to 6: Chew 1 piece of gum every 1 to 2 hours (maximum: 24 pieces/day); to increase chances of quitting, chew at least 9 pieces/day during the first 6 weeks. Weeks 7 to 9: Chew 1 piece of gum every 2 to 4 hours (maximum: 24 pieces/day). Weeks 10 to 12: Chew 1 piece of gum every 4 to 8 hours (maximum: 24 pieces/day). (Patient not taking: Reported on 07/18/2022) 100 each 11   ??? pantoprazole (PROTONIX) 40 MG tablet Take 1 tablet (40 mg total) by mouth daily. (Patient not taking: Reported on 07/18/2022) 30 tablet 1     No current facility-administered medications for this visit.         VITAL SIGNS:   Vitals:    07/18/22 0851   BP: 114/81   Pulse: 81   Resp: 15   Temp: 36.8 ??C (98.3 ??F)   TempSrc: Oral   SpO2: 95%  Weight: 81.1 kg (178 lb 12.7 oz)   Height: 184.2 cm (6' 0.52)       Physical Exam  Eyes:      Extraocular Movements: Extraocular movements intact.   Abdominal:      Palpations: Abdomen is soft.      Tenderness: There is no abdominal tenderness.   Lymphadenopathy:      Cervical: No cervical adenopathy.      Upper Body:      Right upper body: No supraclavicular or axillary adenopathy.      Left upper body: No supraclavicular or axillary adenopathy.   Neurological:      General: No focal deficit present.      Mental Status: He is alert.      Motor: No weakness.      Comments: He can tell me the year.  He follows the conversation and answers questions appropriately .  He can count backward form 100 by 10s. He can follow commands and name two objects. He has 5/5 strength and a normal gait.  Psychiatric:         Mood and Affect: Mood normal.         Behavior: Behavior normal.         LABORATORY:  Lab on 07/18/2022   Component Date Value Ref Range Status   ??? Sodium 07/18/2022 140  135 - 145 mmol/L Final   ??? Potassium 07/18/2022 4.3  3.4 - 4.8 mmol/L Final   ??? Chloride 07/18/2022 109 (H)  98 - 107 mmol/L Final   ??? CO2 07/18/2022 26.0  20.0 - 31.0 mmol/L Final   ??? Anion Gap 07/18/2022 5  5 - 14 mmol/L Final   ??? BUN 07/18/2022 10  9 - 23 mg/dL Final   ??? Creatinine 07/18/2022 0.87  0.60 - 1.10 mg/dL Final   ??? BUN/Creatinine Ratio 07/18/2022 11   Final   ??? eGFR CKD-EPI (2021) Male 07/18/2022 >90  >=60 mL/min/1.87m2 Final    eGFR calculated with CKD-EPI 2021 equation in accordance with [employer] and [employer] of Nephrology Task Force recommendations.   ??? Glucose 07/18/2022 87  70 - 179 mg/dL Final   ??? Calcium 16/07/9603 9.1  8.7 - 10.4 mg/dL Final   ??? Albumin 54/06/8118 4.0  3.4 - 5.0 g/dL Final   ??? Total Protein 07/18/2022 7.2  5.7 - 8.2 g/dL Final   ??? Total Bilirubin 07/18/2022 0.6  0.3 - 1.2 mg/dL Final   ??? AST 14/78/2956 12  <=34 U/L Final   ??? ALT 07/18/2022 13  10 - 49 U/L Final   ??? Alkaline Phosphatase 07/18/2022 59  46 - 116 U/L Final   ??? WBC 07/18/2022 12.0 (H)  3.6 - 11.2 10*9/L Final   ??? RBC 07/18/2022 5.35  4.26 - 5.60 10*12/L Final   ??? HGB 07/18/2022 17.1 (H)  12.9 - 16.5 g/dL Final   ??? HCT 21/30/8657 50.1 (H)  39.0 - 48.0 % Final   ??? MCV 07/18/2022 93.5  77.6 - 95.7 fL Final   ??? MCH 07/18/2022 32.0  25.9 - 32.4 pg Final   ??? MCHC 07/18/2022 34.3  32.0 - 36.0 g/dL Final   ??? RDW 84/69/6295 14.0  12.2 - 15.2 % Final   ??? MPV 07/18/2022 8.0  6.8 - 10.7 fL Final   ??? Platelet 07/18/2022 221  150 - 450 10*9/L Final   ??? Neutrophils % 07/18/2022 68.6  % Final   ??? Lymphocytes % 07/18/2022 21.9  % Final   ???  Monocytes % 07/18/2022 8.6  % Final   ??? Eosinophils % 07/18/2022 0.5  % Final   ??? Basophils % 07/18/2022 0.4  % Final   ??? Absolute Neutrophils 07/18/2022 8.2 (H)  1.8 - 7.8 10*9/L Final   ??? Absolute Lymphocytes 07/18/2022 2.6  1.1 - 3.6 10*9/L Final   ??? Absolute Monocytes 07/18/2022 1.0 (H)  0.3 - 0.8 10*9/L Final   ??? Absolute Eosinophils 07/18/2022 0.1  0.0 - 0.5 10*9/L Final   ??? Absolute Basophils 07/18/2022 0.1  0.0 - 0.1 10*9/L Final

## 2022-07-18 NOTE — Unmapped (Signed)
Outpatient Neurology Consult Note     [facility]  [address]  [address]  [place] [address]    Date: 07/18/2022  Patient Name: Andre Duncan  MRN: [id number]  PCP: Haynes Bast, Magnus Ivan, FNP     Assessment and Plan        Andre Duncan is a 56 y.o. male presenting in consultation for evaluation following ED presentation for seizure like activity.        Seizure  Patient appears to have at least 1 seizure (12/22), fall with loss of consciousness followed by 30 minutes of confusion.  Was started on Keppra 500 mg twice daily in the emergency department subsequently.  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  Patient also possibly had a second seizure on 09/20/21, where he fell and reports he lost consciousness, however there is no postictal confusion. Since then patient and patient's wife deny any seizures or any seizure like activity, while on Keppra, and no side effects from Keppra.          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 Unfortunately patient appears to have developed cognitive deficits, such as word finding difficulty, impairments  with memory as well as visual-spatial deficits (which appear to correlate with his R parietal lobe lesion).  Patient and wife feel like that memantine has been and continues to be very effective for helping his cognition, and denies any side effects. MocA has been stable (20 today and 19 and prior rto visits)      Episodes of confusion/irratbility   Started several weeks ago, can last from hours to all day.  Occurs several times a week. no reported loss of consciousness or seizure-like activity.  This led to receiving a repeat MRI brain which showed a new 0.4 cm right parietal lobe lesion.  Unclear if the lesion is contributing to these episodes of confusion.  It is possible that these episodes of patient could be due to seizure or a postictal state.  However this appears unlikely given the long duration of these episodes, as well as the lack of seizure-like activity or loss of consciousness. But given patient's known brain lesions and hx of seizure would like to try to capture events on 48-hour ambulatory EEG.       Plan:  -48-hour ambulatory EEG.  -Patient should refrain from driving at least until new seizures are ruled out on EEG (this was communicated via in mychart message, as well as phone voicemail)  -Continue Keppra 500 mg twice daily  -Continue Memantine 10mg  BID  -Continue follow-up with hematology and radiation oncology      Return Visit in: 3-4 months    I personally spent 50 minutes face-to-face and non-face-to-face in the care of this patient, which includes all pre, intra, and post visit time on the date of service.           Patient was discussed with Dr.Mehrabyan who agrees with assessment and plan.    Leo Grosser, MD Neurology (PGY-3)           HPI         HPI:   Andre Duncan is a 56 y.o. male with CNS lymphoma  presenting in consultation for evaluation following ED presentation  for seizure like activity.      Patient has CNS lymphoma, received chemotherapy as well as whole brain radiation, with recent relapse in November 2022 and subsequently restarted on radiation and Ibrutinib.  On September 05, 2021 he presented to the emergency department following an episode concerning for seizure.  Where he had an unwitnessed fall, his wife heard a thud came to check on him and saw him awake lying on the floor, however appeared very confused.  He subsequently  presented to the emergency department where, MRI showed no new lesions or enhancement, but persistent vasogenic edema most notable in right frontoparietal regions.  In the emergency department he appeared to be at his normal baseline and neuro exam was nonfocal.  He subsequently was discharged on 500 mg of Keppra twice daily (no load).  He reports he is tolerating this well without side effect.  Of note on September 20, 2021 he had a fall where he reports he lost consciousness (wife states that he did not lose consciousness) however there was no confusion afterwards.  Aside from this patient and wife deny any seizure-like activity or loss of consciousness episodes.  Since radiation patient's wife reports that his cognition has been declining.  He was started on Memantine for neurocognitive protection prophylaxis during his radiation last year.  Wife states she feels like his cognition was much better when he was on the memantine, however he was only on it for 6 months reportedly as part of the radiation protocol.  The wife says his cognitive deficits mostly include word finding difficulty and issues with short-term memory.  She feels like over the past 7 to 10 days his cognition is gradually getting worse.      [place]vail history (10/22/21-02/04/22)  Andre Duncan and wife report that his cognition has improved significantly since memantine dose reached 10mg  BID. 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 He reports no side effect from the memantine. Also Andre Duncan and wife state he has not had any seizure or seizure like activity since December. He also declines any mood side effects from keppra.       Intervail history (02/04/22-07/18/22)  Started several weeks ago has been having episodes of confusion/irritation that can last minutes to hours.  This led to him receiving a repeat MRI brain which showed a new 0.4 mm parietal lesion, wife reports that oncology is concerned that this may be a recurrence of his lymphoma and are planning for CAR-T treatment.  Patient reportedly has not had any episodes of loss of consciousness regularly therapy.         mocA 20 today.  Allergies Allergen Reactions   ??? Sulfa (Sulfonamide Antibiotics) Rash     Developed during hospitalization d/c    ??? Tegaderm Adhesive-No Drug-Allergy Check Other (See Comments)        Current Outpatient Medications   Medication Sig Dispense Refill   ??? cetirizine (ZYRTEC) 10 MG tablet Take 1 tablet (10 mg total) by mouth daily as needed.     ??? cholecalciferol, vitamin D3-50 mcg, 2,000 unit,, 50 mcg (2,000 unit) tablet Take 1 tablet (50 mcg total) by mouth daily.     ??? droNABinol (MARINOL) 2.5 MG capsule Take 2 capsules (5 mg total) by mouth Two (2) times a day (30 minutes before a meal). 240 capsule 0   ??? escitalopram oxalate (LEXAPRO) 20 MG tablet Take 1 tablet (20 mg total) by mouth daily.  90 tablet 2   ??? finasteride (PROSCAR) 5 mg tablet Take 1 tablet (5 mg total) by mouth daily.     ??? fish oil/borage/flax/om3,6,9 1 (OMEGA 3-6-9 COMPLEX ORAL) Take 1 capsule by mouth daily.     ??? fluoride, sodium, 1.1 % Gel Brush teeth with a pea-sized amount of the paste for 2 min and spit.  No rinsing. Do not eat or drink for 30 minutes after use. Use 2x/day. 56 g prn   ??? glycopyrrolate-formoteroL (BEVESPI AEROSPHERE) 9-4.8 mcg HFAA inhaler Inhale 2 puffs Two (2) times a day. Use with spacer. 10.7 g 6   ??? ibrutinib (IMBRUVICA) 280 mg tablet Take 2 tablets (560 mg total) by mouth daily. 56 tablet 11   ??? levETIRAcetam (KEPPRA) 500 MG tablet Take 1 tablet (500 mg total) by mouth Two (2) times a day. 60 tablet 4   ??? memantine (NAMENDA) 10 MG tablet Take 1 tablet (10 mg total) by mouth Two (2) times a day. 60 tablet 5   ??? metFORMIN (GLUCOPHAGE) 500 MG tablet Take 2 tablets (1,000 mg total) by mouth in the morning and 2 tablets (1,000 mg total) in the evening. Take with meals. 180 tablet 3   ??? mirtazapine (REMERON) 30 MG tablet Take 1 tablet by mouth nightly 90 tablet 0   ??? multivitamin (TAB-A-VITE/THERAGRAN) per tablet Take 1 tablet by mouth daily.     ??? OLANZapine (ZYPREXA) 5 MG tablet Take 1 tablet by mouth nightly 90 tablet 0   ??? albuterol HFA 90 mcg/actuation inhaler Inhale 2-4 puffs every four (4) hours as needed for wheezing. With Spacer 18 g 3   ??? ipratropium-albuteroL (DUO-NEB) 0.5-2.5 mg/3 mL nebulizer Inhale 3 mL by nebulization every six (6) hours as needed (wheezing, shortness of breath). (Patient not taking: Reported on 07/18/2022) 270 mL 11   ??? lenalidomide (REVLIMID) 20 mg cap capsule Take 1 capsule by mouth daily for days 1-21 followed by 7 days rest (Patient not taking: Reported on 07/18/2022) 21 capsule 0   ??? nicotine (NICODERM CQ) 7 mg/24 hr patch Place 1 patch on the skin daily for 14 days. 7 mg daily for two week (Patient not taking: Reported on 07/18/2022) 14 patch 0   ??? nicotine polacrilex (NICORETTE) 4 MG gum Apply 1 each (4 mg total) to cheek every hour as needed for smoking cessation. Use according to the following 12-week dosing schedule: Weeks 1 to 6: Chew 1 piece of gum every 1 to 2 hours (maximum: 24 pieces/day); to increase chances of quitting, chew at least 9 pieces/day during the first 6 weeks. Weeks 7 to 9: Chew 1 piece of gum every 2 to 4 hours (maximum: 24 pieces/day). Weeks 10 to 12: Chew 1 piece of gum every 4 to 8 hours (maximum: 24 pieces/day). (Patient not taking: Reported on 07/18/2022) 100 each 11   ??? pantoprazole (PROTONIX) 40 MG tablet Take 1 tablet (40 mg total) by mouth daily. (Patient not taking: Reported on 07/18/2022) 30 tablet 1     No current facility-administered medications for this visit.       Past Medical History:   Diagnosis Date   ??? Anxiety    ??? COPD (chronic obstructive pulmonary disease) (CMS-HCC)    ??? Cytarabine poisoning    ??? Depression    ??? Diabetes mellitus (CMS-HCC)    ??? Pneumonia, aspiration (CMS-HCC)    ??? Prostate atrophy        No past surgical history on file.    Social History  Socioeconomic History   ??? Marital status: Married     Spouse name: None   ??? Number of children: None   ??? Years of education: None   ??? Highest education level: None   Tobacco Use   ??? Smoking status: Every Day Packs/day: 1.00     Years: 30.00     Additional pack years: 0.00     Total pack years: 30.00     Types: Cigarettes   ??? Smokeless tobacco: Never   Vaping Use   ??? Vaping Use: Never used   Substance and Sexual Activity   ??? Alcohol use: Not Currently     Comment: rarely   ??? Drug use: Never   ??? Sexual activity: Yes     Partners: Female     Social Determinants of Health     Financial Resource Strain: Medium Risk (12/01/2020)    Overall Financial Resource Strain (CARDIA)    ??? Difficulty of Paying Living Expenses: Somewhat hard   Food Insecurity: No Food Insecurity (12/01/2020)    Hunger Vital Sign    ??? Worried About Running Out of Food in the Last Year: Never true    ??? Ran Out of Food in the Last Year: Never true   Transportation Needs: No Transportation Needs (12/01/2020)    PRAPARE - Transportation    ??? Lack of Transportation (Medical): No    ??? Lack of Transportation (Non-Medical): No       Family History   Problem Relation Age of Onset   ??? Cancer Mother    ??? Diabetes Mother    ??? Cancer Father             Objective        Vital signs: BP 118/71 (BP Site: R Arm, BP Position: Sitting, BP Cuff Size: Medium)  - Pulse 76  - Ht 184.2 cm (6' 0.52)  - Wt 80.7 kg (177 lb 14.4 oz)  - BMI 23.78 kg/m??      Vitals:    07/18/22 1353   BP Site: R Arm   BP Position: Sitting   BP Cuff Size: Medium         Physical Exam:  .General Appearance: Well appearing. In no acute distress.  Eyes: Sclera are anicteric and without injection. No discharge.   HENT: Head is atraumatic and normocephalic. Oropharyngeal membranes are moist with no erythema or exudate.  Neck: Grossly normal range of motion.  Lymphatic: Deferred.  Respiratory: Normal work of breathing.  Cardiovascular: Warm and well-perfused.  Gastrointestinal: Nondistended.    Extremities: No clubbing, cyanosis, or edema.  Skin: no rashes or significant lesions on examined skin.  Psychiatric: Behavior and affect are appropriate.     Neurological Examination:     Mental Status: Alert, conversant, able to follow conversation and interview. Spontaneous speech was fluent without word finding pauses, dysarthria, or paraphasic errors. Comprehension was intact. Memory for recent and remote events was intact.   MCoA score 20    Cranial Nerves: PERRL. Pursuit eye movements were uninterrupted with full range and without more than end-gaze nystagmus. Facial sensation intact bilaterally to light touch in all three divisions of CNV. Face symmetric at rest. Normal facial movement bilaterally, including forehead, eye closure and grimace/smile. Hearing intact to conversation. Shoulder shrug full strength bilaterally. Palate movement is symmetric. Tongue protrudes midline and tongue movements are normal.     Motor Exam: Normal bulk.  No tremors, myoclonus, or other adventitious movement.  Pronator drift is absent.  UE R/L:  deltoid 5/5, biceps 5/5, triceps 5/5, and hand grip strong/strong.  LE R/L: hip flexion 5/5, hip extension 5/5, hip abduction 5/5, hip adduction 5/5, quadriceps 5/5, hamstrings 5/5, dorsiflexion 5/5, and plantar flexion 5/5.    Reflexes: DTRs R/L: biceps 2+/2+, brachioradialis 2+/2+, patella 2+/2+, and ankle jerk 2+/2+.     Sensory: Intact and symmetric to light touch in all extremities     Cerebellar/Coordination/Gait:Gait exam demonstrates normal posture, base, stride length, arm swing and turns.          Diagnostic Studies and Review of Records   Labs:  All Labs Last 24hrs:   Recent Results (from the past 24 hour(s))   Comprehensive Metabolic Panel    Collection Time: 07/18/22  8:41 AM   Result Value Ref Range    Sodium 140 135 - 145 mmol/L    Potassium 4.3 3.4 - 4.8 mmol/L    Chloride 109 (H) 98 - 107 mmol/L    CO2 26.0 20.0 - 31.0 mmol/L    Anion Gap 5 5 - 14 mmol/L    BUN 10 9 - 23 mg/dL    Creatinine 1.61 0.96 - 1.10 mg/dL    BUN/Creatinine Ratio 11     eGFR CKD-EPI (2021) Male >90 >=60 mL/min/1.48m2    Glucose 87 70 - 179 mg/dL    Calcium 9.1 8.7 - 04.5 mg/dL    Albumin 4.0 3.4 - 5.0 g/dL    Total Protein 7.2 5.7 - 8.2 g/dL    Total Bilirubin 0.6 0.3 - 1.2 mg/dL    AST 12 <=40 U/L    ALT 13 10 - 49 U/L    Alkaline Phosphatase 59 46 - 116 U/L   CBC w/ Differential    Collection Time: 07/18/22  8:41 AM   Result Value Ref Range    WBC 12.0 (H) 3.6 - 11.2 10*9/L    RBC 5.35 4.26 - 5.60 10*12/L    HGB 17.1 (H) 12.9 - 16.5 g/dL    HCT 98.1 (H) 19.1 - 48.0 %    MCV 93.5 77.6 - 95.7 fL    MCH 32.0 25.9 - 32.4 pg    MCHC 34.3 32.0 - 36.0 g/dL    RDW 47.8 29.5 - 62.1 %    MPV 8.0 6.8 - 10.7 fL    Platelet 221 150 - 450 10*9/L    Neutrophils % 68.6 %    Lymphocytes % 21.9 %    Monocytes % 8.6 %    Eosinophils % 0.5 %    Basophils % 0.4 %    Absolute Neutrophils 8.2 (H) 1.8 - 7.8 10*9/L    Absolute Lymphocytes 2.6 1.1 - 3.6 10*9/L    Absolute Monocytes 1.0 (H) 0.3 - 0.8 10*9/L    Absolute Eosinophils 0.1 0.0 - 0.5 10*9/L    Absolute Basophils 0.1 0.0 - 0.1 10*9/L       Imaging:  MRI brian personally reviewed:  IMPRESSION:  -Interval decrease in size of enhancing lesions in the right centrum semiovale and right parietal lobe. Additionally, there has been a slight interval decrease in surrounding vasogenic edema. No new enhancing lesions.              Specimen Collected: 09/04/21 21:46

## 2022-07-18 NOTE — Unmapped (Signed)
It is possible that these episodes of confusion can be seizures. For this reason we would like to try to capture on EEG. I placed an order of an ambulatory EEG for 2 days. Next time he has one of the episodes of confusion please try to record it on camera, and also ask him questions and to do commands.

## 2022-07-18 NOTE — Unmapped (Signed)
Once you have the revlimid you can stop the imbruvica.  Take a day off in between  I am looking into the option of CAR-T therapy. I need to see if insurance will approve it and see how the lymphoma and CART tream feels about CART for you with your history of CNS lymphoma etc.

## 2022-07-19 DIAGNOSIS — Z720 Tobacco use: Principal | ICD-10-CM

## 2022-07-19 DIAGNOSIS — F32A Anxiety and depression: Principal | ICD-10-CM

## 2022-07-19 DIAGNOSIS — C8589 Other specified types of non-Hodgkin lymphoma, extranodal and solid organ sites: Principal | ICD-10-CM

## 2022-07-19 DIAGNOSIS — F17200 Nicotine dependence, unspecified, uncomplicated: Principal | ICD-10-CM

## 2022-07-19 DIAGNOSIS — F419 Anxiety disorder, unspecified: Principal | ICD-10-CM

## 2022-07-19 DIAGNOSIS — Z8701 Personal history of pneumonia (recurrent): Principal | ICD-10-CM

## 2022-07-19 DIAGNOSIS — C8330 Diffuse large B-cell lymphoma, unspecified site: Principal | ICD-10-CM

## 2022-07-19 DIAGNOSIS — R0609 Other forms of dyspnea: Principal | ICD-10-CM

## 2022-07-19 DIAGNOSIS — J439 Emphysema, unspecified: Principal | ICD-10-CM

## 2022-07-19 DIAGNOSIS — R942 Abnormal results of pulmonary function studies: Principal | ICD-10-CM

## 2022-07-19 MED ORDER — ESCITALOPRAM 20 MG TABLET
ORAL_TABLET | Freq: Every day | ORAL | 2 refills | 90.00000 days
Start: 2022-07-19 — End: ?

## 2022-07-19 MED ORDER — NICOTINE (POLACRILEX) 4 MG GUM
BUCCAL | 11 refills | 5.00000 days | PRN
Start: 2022-07-19 — End: ?

## 2022-07-19 MED ORDER — DRONABINOL 2.5 MG CAPSULE
ORAL_CAPSULE | Freq: Two times a day (BID) | ORAL | 0 refills | 60 days
Start: 2022-07-19 — End: 2022-09-17

## 2022-07-19 MED ORDER — FINASTERIDE 5 MG TABLET
ORAL_TABLET | Freq: Every day | ORAL | 0 refills | 90 days
Start: 2022-07-19 — End: ?

## 2022-07-19 MED ORDER — PANTOPRAZOLE 40 MG TABLET,DELAYED RELEASE
ORAL_TABLET | Freq: Every day | ORAL | 1 refills | 30 days
Start: 2022-07-19 — End: 2023-07-19

## 2022-07-19 MED ORDER — ALBUTEROL SULFATE HFA 90 MCG/ACTUATION AEROSOL INHALER
RESPIRATORY_TRACT | 3 refills | 0 days | PRN
Start: 2022-07-19 — End: ?

## 2022-07-19 MED ORDER — OLANZAPINE 5 MG TABLET
ORAL_TABLET | Freq: Every evening | ORAL | 0 refills | 90 days
Start: 2022-07-19 — End: ?

## 2022-07-19 MED ORDER — MEMANTINE 10 MG TABLET
ORAL_TABLET | Freq: Two times a day (BID) | ORAL | 4 refills | 30 days
Start: 2022-07-19 — End: ?

## 2022-07-19 MED ORDER — MIRTAZAPINE 30 MG TABLET
ORAL_TABLET | Freq: Every evening | ORAL | 0 refills | 90.00000 days
Start: 2022-07-19 — End: ?

## 2022-07-19 MED ORDER — BEVESPI AEROSPHERE 9 MCG-4.8 MCG HFA AEROSOL INHALER
Freq: Two times a day (BID) | RESPIRATORY_TRACT | 6 refills | 30 days
Start: 2022-07-19 — End: ?

## 2022-07-20 MED ORDER — MEMANTINE 10 MG TABLET
ORAL_TABLET | Freq: Two times a day (BID) | ORAL | 4 refills | 30 days
Start: 2022-07-20 — End: ?

## 2022-07-21 MED ORDER — BEVESPI AEROSPHERE 9 MCG-4.8 MCG HFA AEROSOL INHALER
Freq: Two times a day (BID) | RESPIRATORY_TRACT | 6 refills | 30.00000 days | Status: CP
Start: 2022-07-21 — End: ?

## 2022-07-21 MED ORDER — OLANZAPINE 5 MG TABLET
ORAL_TABLET | Freq: Every evening | ORAL | 0 refills | 90.00000 days | Status: CP
Start: 2022-07-21 — End: 2023-07-21

## 2022-07-21 MED ORDER — ESCITALOPRAM 20 MG TABLET
ORAL_TABLET | Freq: Every day | ORAL | 2 refills | 90.00000 days | Status: CP
Start: 2022-07-21 — End: ?

## 2022-07-21 MED ORDER — PANTOPRAZOLE 40 MG TABLET,DELAYED RELEASE
ORAL_TABLET | Freq: Every day | ORAL | 1 refills | 30 days | Status: CP
Start: 2022-07-21 — End: 2023-07-21

## 2022-07-21 MED ORDER — FINASTERIDE 5 MG TABLET
ORAL_TABLET | Freq: Every day | ORAL | 0 refills | 90 days | Status: CP
Start: 2022-07-21 — End: 2023-07-21

## 2022-07-21 MED ORDER — MIRTAZAPINE 30 MG TABLET
ORAL_TABLET | Freq: Every evening | ORAL | 0 refills | 90.00000 days | Status: CP
Start: 2022-07-21 — End: ?

## 2022-07-21 MED ORDER — DRONABINOL 2.5 MG CAPSULE
ORAL_CAPSULE | Freq: Two times a day (BID) | ORAL | 0 refills | 60 days | Status: CP
Start: 2022-07-21 — End: 2022-09-19

## 2022-07-21 MED ORDER — ALBUTEROL SULFATE HFA 90 MCG/ACTUATION AEROSOL INHALER
RESPIRATORY_TRACT | 3 refills | 0 days | Status: CP | PRN
Start: 2022-07-21 — End: ?

## 2022-07-21 NOTE — Unmapped (Signed)
Request received via interface.     Provider: Dr. Sabino Niemann    Last Visit Date: 07/18/2022    Next Visit Date: 10/24/2022    No results found for: CBC, CMP     Results for orders placed or performed during the hospital encounter of 11/02/21   ECG 12 Lead   Result Value Ref Range    EKG Systolic BP  mmHg    EKG Diastolic BP  mmHg    EKG Ventricular Rate 62 BPM    EKG Atrial Rate 62 BPM    EKG P-R Interval 166 ms    EKG QRS Duration 86 ms    EKG Q-T Interval 392 ms    EKG QTC Calculation 397 ms    EKG Calculated P Axis 74 degrees    EKG Calculated R Axis 15 degrees    EKG Calculated T Axis 45 degrees    QTC Fredericia 396 ms

## 2022-07-21 NOTE — Unmapped (Signed)
Pt needs PE or at least follow up appt to get further refills

## 2022-07-21 NOTE — Unmapped (Signed)
Patient is requesting the following refill  Requested Prescriptions     Pending Prescriptions Disp Refills    mirtazapine (REMERON) 30 MG tablet 90 tablet 0     Sig: Take 1 tablet (30 mg total) by mouth nightly.       Recent Visits  Date Type Provider Dept   05/07/22 Office Visit Loran Senters, FNP [facility] [address] [facility]   11/14/21 Office Visit Coralee North, Magnus Ivan, FNP [facility]   Showing recent visits within past 365 days with a meds authorizing provider and meeting all other requirements  Future Appointments  No visits were found meeting these conditions.  Showing future appointments within next 365 days with a meds authorizing provider and meeting all other requirements       Labs: Not applicable this refill

## 2022-07-21 NOTE — Unmapped (Signed)
Patient is requesting the following refill  Requested Prescriptions     Pending Prescriptions Disp Refills    OLANZapine (ZYPREXA) 5 MG tablet 90 tablet 0     Sig: Take 1 tablet (5 mg total) by mouth nightly.       Recent Visits  Date Type Provider Dept   05/07/22 Office Visit Loran Senters, FNP [facility] [address] [facility]   11/14/21 Office Visit Coralee North, Magnus Ivan, FNP [facility]   Showing recent visits within past 365 days with a meds authorizing provider and meeting all other requirements  Future Appointments  No visits were found meeting these conditions.  Showing future appointments within next 365 days with a meds authorizing provider and meeting all other requirements       Labs: Not applicable this refill

## 2022-07-22 DIAGNOSIS — Z006 Encounter for examination for normal comparison and control in clinical research program: Principal | ICD-10-CM

## 2022-07-22 DIAGNOSIS — C8589 Other specified types of non-Hodgkin lymphoma, extranodal and solid organ sites: Principal | ICD-10-CM

## 2022-07-22 MED ORDER — ESCITALOPRAM 20 MG TABLET
ORAL_TABLET | Freq: Every day | ORAL | 2 refills | 90 days | Status: CP
Start: 2022-07-22 — End: ?

## 2022-07-22 MED ORDER — LENALIDOMIDE 20 MG CAPSULE
ORAL_CAPSULE | 0 refills | 0 days | Status: CP
Start: 2022-07-22 — End: ?

## 2022-07-22 NOTE — Unmapped (Signed)
Patient is now scheduled

## 2022-07-23 DIAGNOSIS — C8589 Other specified types of non-Hodgkin lymphoma, extranodal and solid organ sites: Principal | ICD-10-CM

## 2022-07-23 NOTE — Unmapped (Signed)
HE will start revlimid on 10/21.

## 2022-07-24 MED ORDER — NICOTINE (POLACRILEX) 4 MG GUM
BUCCAL | 11 refills | 5.00000 days | Status: CP | PRN
Start: 2022-07-24 — End: ?

## 2022-07-28 DIAGNOSIS — C8589 Other specified types of non-Hodgkin lymphoma, extranodal and solid organ sites: Principal | ICD-10-CM

## 2022-07-28 MED ORDER — HYDROXYZINE HCL 25 MG TABLET
ORAL_TABLET | Freq: Four times a day (QID) | ORAL | 1 refills | 8 days | Status: CP | PRN
Start: 2022-07-28 — End: ?

## 2022-07-28 NOTE — Unmapped (Signed)
S/O: Andre Duncan and his wife were contacted today via telephone to check in following reports over the weekend via MyChart of a skin rash following second dose of Revlimid (see MyChart message from 10/22 for rash details).    Patient's wife confirmed the patient started Revlimid on Saturday and got a rash on Sunday after the second dose. She gave him benadryl 50 mg and allegra 180 mg and topical hydrocortisone. The rash appeared around 4 PM and started to improve around 5:15 PM. Today, the rash is mostly all resolved, just some areas of lightly pink skin and itching symptoms are completely resolved. She denied any signs or symptoms of anaphylaxis.     A/P: Patient experienced rash and itching following second dose of Revlimid. These are not uncommon adverse affects associated with Revlimid. It is also not uncommon for the rash and itching to not recur following a re-challenge of Revlimid. Given the mildness of the rash and how quickly it responded to treatment, patient was instructed to resume Revlimid this morning, once per day. Should rash return, they should treat it as the did over the weekend and contact our office. They verbalized understanding of this plan.    Time spent: 27 minutes    [place] Gunner Iodice, PharmD, BCPS, BCOP, CPP  Clinical Pharmacist Practitioner, Myeloma and Lymphoma  Pager: [phone]

## 2022-07-28 NOTE — Unmapped (Signed)
Reason for Disposition  ??? Patient sounds very sick or weak to the triager    Answer Assessment - Initial Assessment Questions  1. APPEARANCE of RASH: Describe the rash. (e.g., spots, blisters, raised areas, skin peeling, scaly)      *No Answer*  2. SIZE: How big are the spots? (e.g., tip of pen, eraser, coin; inches, centimeters)      *No Answer*  3. LOCATION: Where is the rash located?      *No Answer*  4. COLOR: What color is the rash? (Note: It is difficult to assess rash color in people with darker-colored skin. When this situation occurs, simply ask the caller to describe what they see.)      *No Answer*  5. ONSET: When did the rash begin?      *No Answer*  6. FEVER: Do you have a fever? If Yes, ask: What is your temperature, how was it measured, and when did it start?      *No Answer*  7. ITCHING: Does the rash itch? If Yes, ask: How bad is the itch? (Scale 1-10; or mild, moderate, severe)      *No Answer*  8. CAUSE: What do you think is causing the rash?      *No Answer*  9. MEDICINE FACTORS: Have you started any new medicines within the last 2 weeks? (e.g., antibiotics)       *No Answer*  10. OTHER SYMPTOMS: Do you have any other symptoms? (e.g., dizziness, headache, sore throat, joint pain)        *No Answer*  11. PREGNANCY: Is there any chance you are pregnant? When was your last menstrual period?        *No Answer*    Answer Assessment - Initial Assessment Questions  1. APPEARANCE: What does the rash look like?       Red and pink flat splotches except on arm has bumpy raised patches - subsiding in inflammation and redness since 4:30 p.m.   2. LOCATION: Where is the rash located?       Chest, abdomen, both arms, back, behind ear,forehead    3. NUMBER: How many hives are there?       Over 5  4. SIZE: How big are the hives? (inches, cm, compare to coins) Do they all look the same or is there lots of variation in shape and size?   Smallest 1/2 inch,  right forearm 6 inches by 3 inches   5. ONSET: When did the hives begin? (Hours or days ago)       Today at 1:30 p.m.   6. ITCHING: Does it itch? If Yes, ask: How bad is the itch?     - MILD: doesn't interfere with normal activities    - MODERATE-SEVERE: interferes with work, school, sleep, or other activities       Mild   7. RECURRENT PROBLEM: Have you had hives before? If Yes, ask: When was the last time? and What happened that time?       No   8. TRIGGERS: Were you exposed to any new food, plant, cosmetic product or animal just before the hives began?      New medication - revlimid at 8:30 a.m. 2nd dose, also had first dose at 8:30 a.m. on 07/26/22  9. OTHER SYMPTOMS: Do you have any other symptoms? (e.g., fever, tongue swelling, difficulty breathing, abdomen pain)      No   10. PREGNANCY: Is there any chance you are pregnant? When was  your last menstrual period?        N/a    Answer Assessment - Initial Assessment Questions  1. APPEARANCE of RASH: Describe the rash. (e.g., spots, blisters, raised areas, skin peeling, scaly)      See other GL   2. SIZE: How big are the spots? (e.g., tip of pen, eraser, coin; inches, centimeters)      See other GL   3. LOCATION: Where is the rash located?      See other GL   4. COLOR: What color is the rash? (Note: It is difficult to assess rash color in people with darker-colored skin. When this situation occurs, simply ask the caller to describe what they see.)  Red and pink   5. ONSET: When did the rash begin?      See other GL   6. FEVER: Do you have a fever? If Yes, ask: What is your temperature, how was it measured, and when did it start?      See other Gl   7. ITCHING: Does the rash itch? If Yes, ask: How bad is the itch? (Scale 1-10; or mild, moderate, severe)      See other GL   8. CAUSE: What do you think is causing the rash?      See other GL   9. NEW MEDICINES: What new medicines are you taking? (e.g., name of antibiotic) When did you start taking this medication?.      See other GL  10. OTHER SYMPTOMS: Do you have any other symptoms? (e.g., sore throat, fever, joint pain)        No   11. PREGNANCY: Is there any chance you are pregnant? When was your last menstrual period?        N/a    Protocols used: Rash or Redness - Widespread-A-AH, Hives-A-AH, Rash - Widespread On Drugs-A-AH

## 2022-07-28 NOTE — Unmapped (Signed)
Patient contacted our office to report that the rash has returned. His right antecubital fosa, both axila, and right ear are a dark red and itchy. Parts of his abdomen and much of his back is a lighter red. Patient's wife gave him Benadryl 50 mg, Allegra 180 mg, and topical hydrocortisone around 11 AM (took Revlimid around 10:30 AM) and his is sleeping now, but rash seems to be getting better now (~12:50).     I reviewed the case with Dr. Elmon Kirschner as well. Following our discussion, we decided that patient should continue Revlimid 20 mg once daily and start taking Benadryl 25 mg and Allegra 180 mg about 30 minutes prior to Revlimid. He can continue to use topical hydrocortisone prn. I will also prescribe some Hydroxyzine to use prn itching. This plan was shared with patient's wife and she verbalized understanding of plan.    Time spent: 28 minutes    [place] Delissa Silba, PharmD, BCPS, BCOP, CPP  Clinical Pharmacist Practitioner, Myeloma and Lymphoma  Pager: [phone]

## 2022-07-28 NOTE — Unmapped (Addendum)
TC to patient, per Nurse Connect protocol Nurse Connect RN will call patient back in one hour to check on status of patient and at that time will determine need to contact provider.       Upcoming Appt:  Future Appointments   Date Time Provider Department Center   08/01/2022  7:00 AM ADULT ONC LAB [facility]   08/01/2022  8:00 AM ONCINF CHAIR 21 [id number]   08/14/2022  8:30 AM ADULT ONC LAB [facility]   08/14/2022  9:30 AM Gae Bon, MD [id number]   08/15/2022  8:15 AM ONCDEV CHAIR 51 [id number]   09/12/2022  9:00 AM Luvenia [facility]   09/12/2022 11:30 AM Lim, Si On, DDS [id number]   09/12/2022  1:00 PM ONCINF CHAIR 18 [id number]   09/18/2022 10:20 AM Amie [place], FNP [facility]   10/10/2022 10:00 AM ADULT ONC LAB [facility]   10/10/2022 11:00 AM Gae Bon, MD [id number]   10/10/2022 12:00 PM ONCINF CHAIR 18 [id number]   10/24/2022  1:55 PM Leo Grosser, MD [facility]       Recent:   What is the date of your last related visit?  Seen on 07/21/22 at neuro   Related acute medications Rx'd:  revlimid - started 07/26/22, 50 mg benadryl at 4:30 p.m.,  topical benadryl and hydrocortisone at 4:45 p.m. and allegra at 4:30 p.m. 180 mg  Home treatment tried:  none       Relevant:   Allergies: Sulfa (sulfonamide antibiotics) and Tegaderm adhesive-no drug-allergy check  Medications: revlimid   Health History: CNS lymphoma, seizures, COPD   Weight: n/a     Rash on forehead ears, stomach, backs right arm     [place] [facility] patients only:  What was the date of your last cancer treatment (mm/dd/yy)?: 07/27/22  Was the treatment oral or infusion?: oral  Are you currently on TVEC (yes/no)?: no     Regarding: rash/hives on arms, head, stomach and head after taking second dose of revlimid  ----- Message from Alease Frame, LRT/CTRS  sent at 07/27/2022  5:02 PM EDT -----  If you had not called Nurse Connect, what do you think you would have done?   Go to Emergency Dept

## 2022-07-28 NOTE — Unmapped (Signed)
Additional Information  ??? Commented on: Patient sounds very sick or weak to the triager     Per Nurse Connect protocol, will call patient back in 1 hour to check on status and determine need to page provider or go to ED.    Protocols used: Rash - Widespread On Drugs-A-AH

## 2022-07-28 NOTE — Unmapped (Signed)
TC to patient's wife. She says areas that were now inflamed are now flat on forehead and behind ear and are now pink.  Areas on stomach and  back are now fainter pink color and reducing in size, now 3 inches by 2 inches from abdomen to back, decreased by half, and no longer itchy. No trouble breathing or swallowing.  Reminded patient's wife to call Heme/Onc office tomorrow morning before administering Revlimid and to also send mychart message this evening to Heme/Onc about patient's symptoms this evening and guidance for Revlimid.

## 2022-07-30 DIAGNOSIS — C8589 Other specified types of non-Hodgkin lymphoma, extranodal and solid organ sites: Principal | ICD-10-CM

## 2022-07-30 NOTE — Unmapped (Signed)
HE has started revlimid on 10/20. He is getting a rash with it so we are trying anti-histamines.  We are awaiting insurance approval for CART 1813 study.  Once we have that we will try to collect cells around 11/10 or the following week.   WE will start ritximab after collection - on 11/16.

## 2022-08-01 ENCOUNTER — Ambulatory Visit: Admit: 2022-08-01 | Discharge: 2022-08-02 | Payer: PRIVATE HEALTH INSURANCE

## 2022-08-01 ENCOUNTER — Other Ambulatory Visit: Admit: 2022-08-01 | Discharge: 2022-08-02 | Payer: PRIVATE HEALTH INSURANCE

## 2022-08-01 ENCOUNTER — Institutional Professional Consult (permissible substitution): Admit: 2022-08-01 | Discharge: 2022-08-02 | Payer: PRIVATE HEALTH INSURANCE | Attending: Oncology | Primary: Oncology

## 2022-08-01 DIAGNOSIS — C8589 Other specified types of non-Hodgkin lymphoma, extranodal and solid organ sites: Principal | ICD-10-CM

## 2022-08-01 LAB — COMPREHENSIVE METABOLIC PANEL
ALBUMIN: 3.8 g/dL (ref 3.4–5.0)
ALKALINE PHOSPHATASE: 71 U/L (ref 46–116)
ALT (SGPT): 35 U/L (ref 10–49)
ANION GAP: 8 mmol/L (ref 5–14)
AST (SGOT): 18 U/L (ref <=34)
BILIRUBIN TOTAL: 0.4 mg/dL (ref 0.3–1.2)
BLOOD UREA NITROGEN: 16 mg/dL (ref 9–23)
BUN / CREAT RATIO: 23
CALCIUM: 9 mg/dL (ref 8.7–10.4)
CHLORIDE: 105 mmol/L (ref 98–107)
CO2: 25 mmol/L (ref 20.0–31.0)
CREATININE: 0.7 mg/dL — ABNORMAL LOW
EGFR CKD-EPI (2021) MALE: >90 mL/min/{1.73_m2} (ref >=60)
GLUCOSE RANDOM: 154 mg/dL (ref 70–179)
POTASSIUM: 4.1 mmol/L (ref 3.4–4.8)
PROTEIN TOTAL: 7.2 g/dL (ref 5.7–8.2)
SODIUM: 138 mmol/L (ref 135–145)

## 2022-08-01 LAB — CBC W/ AUTO DIFF
BASOPHILS ABSOLUTE COUNT: 0 10*9/L (ref 0.0–0.1)
BASOPHILS RELATIVE PERCENT: 0.2 %
EOSINOPHILS ABSOLUTE COUNT: 0.1 10*9/L (ref 0.0–0.5)
EOSINOPHILS RELATIVE PERCENT: 1.4 %
HEMATOCRIT: 48.1 % — ABNORMAL HIGH (ref 39.0–48.0)
HEMOGLOBIN: 16.5 g/dL (ref 12.9–16.5)
LYMPHOCYTES ABSOLUTE COUNT: 1.5 10*9/L (ref 1.1–3.6)
LYMPHOCYTES RELATIVE PERCENT: 16.1 %
MEAN CORPUSCULAR HEMOGLOBIN CONC: 34.3 g/dL (ref 32.0–36.0)
MEAN CORPUSCULAR HEMOGLOBIN: 31.8 pg (ref 25.9–32.4)
MEAN CORPUSCULAR VOLUME: 92.7 fL (ref 77.6–95.7)
MEAN PLATELET VOLUME: 7.5 fL (ref 6.8–10.7)
MONOCYTES ABSOLUTE COUNT: 0.5 10*9/L (ref 0.3–0.8)
MONOCYTES RELATIVE PERCENT: 5.6 %
NEUTROPHILS ABSOLUTE COUNT: 7.1 10*9/L (ref 1.8–7.8)
NEUTROPHILS RELATIVE PERCENT: 76.7 %
PLATELET COUNT: 243 10*9/L (ref 150–450)
RED BLOOD CELL COUNT: 5.19 10*12/L (ref 4.26–5.60)
RED CELL DISTRIBUTION WIDTH: 14 % (ref 12.2–15.2)
WBC ADJUSTED: 9.2 10*9/L (ref 3.6–11.2)

## 2022-08-01 MED ORDER — METHYLPREDNISOLONE 4 MG TABLETS IN A DOSE PACK
0 refills | 0 days | Status: CP
Start: 2022-08-01 — End: ?
  Filled 2022-08-01: qty 21, 6d supply, fill #0

## 2022-08-01 MED ADMIN — riTUXimab-abbs (TRUXIMA) 758 mg in sodium chloride (NS) 0.9 % 615.8 mL IVPB: 375 mg/m2 | INTRAVENOUS | @ 14:00:00 | Stop: 2022-08-01

## 2022-08-01 MED ADMIN — meperidine (DEMEROL) injection 25 mg: 25 mg | INTRAVENOUS | @ 17:00:00 | Stop: 2022-08-01

## 2022-08-01 MED ADMIN — sodium chloride (NS) 0.9 % infusion: 100 mL/h | INTRAVENOUS | @ 14:00:00

## 2022-08-01 MED ADMIN — famotidine (PF) (PEPCID) injection 20 mg: 20 mg | INTRAVENOUS | @ 17:00:00

## 2022-08-01 MED ADMIN — diphenhydrAMINE (BENADRYL) capsule 50 mg: 50 mg | ORAL | @ 14:00:00 | Stop: 2022-08-01

## 2022-08-01 MED ADMIN — acetaminophen (TYLENOL) tablet 650 mg: 650 mg | ORAL | @ 14:00:00 | Stop: 2022-08-01

## 2022-08-01 NOTE — Unmapped (Signed)
Patient arrived to chair 11. Complaints of a rash on torso, arms and right leg noted. Pt states MD is aware.  Access of PIV intact with positive blood return. CPP up to counsel on C1D1 rituximab. Around 1245, patient complained of shaking. Pt stated he was cold. Warm blankets given and shaking appeared to subside. VSS throughout. APP made aware. At rate change at 1315, pt was seen visibly shaking again. Infusion paused and APP made aware again. Demerol and pepcid given. Infusion continued at 1422. At next vital check, temp was 99.9 orally. APP made aware. Patient completed and tolerated treatment with no additional concerns.  AVS declined and patient discharged to home.

## 2022-08-01 NOTE — Unmapped (Signed)
NA

## 2022-08-01 NOTE — Unmapped (Signed)
Clinical Pharmacist Practitioner: Lymphoma Clinic       Patient Name: Andre Duncan  Patient Age: 56 y.o.  Encounter Date: 08/01/2022    Initial reason for consult: Chemotherapy Monitoring  Referred by: Dr. Elmon Kirschner    Chemotherapy regimen: Revlimid and Rituximab (R2)  - Rituximab 375 mg/m2 IV D1 Q28D  - Revlimid 20 mg PO daily, days 1-21/28    Current place in therapy is C1D7 of Revlimid and C1D1 of Rituximab     Start date: 07/26/22 for Revlimid and 08/01/22 for Rituximab    Subjective/Objective:  Andre Duncan was seen today in infusion clinic for chemotherapy monitoring following initiation of R2 for treatment of relapsed primary CNS lymphoma (PCNSL).    Adherence  Andre Duncan is taking Revlimid correctly, once per day in the morning, with no missed doses. He is also taking diphenhydramine 25 mg and fexofenadine 180 mg 30 minutes prior to each dose. He is using topical hydrocortisone as needed and has used one dose of hydroxyzine for itching.     Tolerability  Patient first reported a skin rash on 10/22, the day after starting Revlimid. Since our telephone call on 10/23, they have been using diphenhydramine and fexofenadine as a pre-medication prior to each dose as well as topical hydrocortisone cream and oral hydroxyzine as needed. The rash continues to come and go, responding well to topical hydrocortisone when needed. It has been occurring in the same affected areas of his upper arms, axilla, abdomen, and back. Today, in clinic, the only signs of rash is a small area on the patient's right shin, about 3 inches long by 1 inch wide.       Assessment:  Andre Duncan is a 56 y.o. male with relapsed PCNSL who is seen in consultation at the request of Dr. Elmon Kirschner for chemotherapy monitoring following initiation of R2 (details above). Patient is taking Revlimid and supportive care medications correctly, with no missed doses. Revlimid is known to cause skin rash; however the appearance as well as the frequent coming and going of the rash are atypical. That said, based off timing of Revlimid administration and onset of symptoms, it is likely the Revlimid causing the rash. Typically, these rashes run their course over 1-2 weeks, resolve, and do not return. The patient has had this rash for about 1 week so far. I think the patient could benefit from oral steroids to better control the rash for the next week or so and have prescribed a Medrol Dosepak for him to take for the next 6 days. He is also to continue his current premedications and prn medications. Patient verbalized understanding of plan.       ................    Plan and Recommendations:  PCNSL  - Continue Revlimid 20 mg PO daily, days 1-21/28  - Start Rituximab today    2. Rash  - Start Medrol dosepak  - Continue diphenhydramine 25 mg 30 minutes before Revlimid  - Continue fexofenadine 180 mg 30 minutes before Revlimid  - Continue topical hydrocortisone prn  - Continue hydroxyzine 25 mg PO Q6H prn    3. VTE Prophylaxis  - Start Aspirin 81 mg PO daily once Revlimid is started    Follow up:  - 08/14/22 with Dr. Elmon Kirschner    I spent 18 minutes in direct patient care.    [place] Ralphael Southgate, PharmD, BCPS, BCOP, CPP  Clinical Pharmacist Practitioner, Myeloma and Lymphoma  Pager: [phone]      History of Present Illness:  We had  the pleasure of seeing Andre Duncan in the Lymphoma Clinic at the [facility] [place] on 08/01/2022.     His/her oncologic history is as follows:    Oncology History   Primary CNS lymphoma (CMS-HCC)   11/28/2020 Initial Diagnosis    Primary CNS lymphoma (CMS-HCC)     11/30/2020 - 11/30/2020 Chemotherapy    IP LYMPHOMA HIGH-DOSE METHOTREXATE  riTUXimab 375 mg/m2 (standard and rapid infusion) IV on day 1, vinCRIStine 1.4 mg/m2 (max 2 mg) IV on day 1, DOXOrubicin 50 mg/m2 IV on day 1, cyclophosphamide 750 mg/m2 IV on day 1, predniSONE 100 mg PO on days 1-5, methotrexate (high-dose) IV on Day 15 (even cycles), every 21-day.     01/03/2021 -  Radiation    Radiation Therapy Treatment Details (Noted on 01/03/2021)  Site: Midline Brain  Technique: 3D CRT  Goal: No goal specified  Planned Treatment Start Date: No planned start date specified     08/23/2021 -  Radiation    Radiation Therapy Treatment Details (Noted on 08/23/2021)  Site: Bilateral Brain  Technique: SRS  Goal: No goal specified  Planned Treatment Start Date: No planned start date specified     08/01/2022 -  Chemotherapy    OP LYMPHOMA LENALIDOMIDE +/- RITUXIMAB +/- MAINTENANCE  Lenalidomide 20 mg PO on days 1 to 21, riTUXimab 375 mg/m2 IV on days 1, 8, 15, 22 of Cycle 1, then 375 mg/m2 IV on day 1 monthly or bimonthly for 1-2 years, every 28 days           MEDICATIONS:  Current Outpatient Medications   Medication Sig Dispense Refill    albuterol HFA 90 mcg/actuation inhaler Inhale 2-4 puffs every four (4) hours as needed for wheezing. With Spacer 18 g 3    cetirizine (ZYRTEC) 10 MG tablet Take 1 tablet (10 mg total) by mouth daily as needed.      cholecalciferol, vitamin D3-50 mcg, 2,000 unit,, 50 mcg (2,000 unit) tablet Take 1 tablet (50 mcg total) by mouth daily.      droNABinol (MARINOL) 2.5 MG capsule Take 2 capsules (5 mg total) by mouth Two (2) times a day (30 minutes before a meal). 240 capsule 0    escitalopram oxalate (LEXAPRO) 20 MG tablet Take 1 tablet (20 mg total) by mouth daily. 90 tablet 2    escitalopram oxalate (LEXAPRO) 20 MG tablet Take 1 tablet (20 mg total) by mouth daily. 90 tablet 2    escitalopram oxalate (LEXAPRO) 20 MG tablet Take 1 tablet (20 mg total) by mouth daily. 90 tablet 2    finasteride (PROSCAR) 5 mg tablet Take 1 tablet (5 mg total) by mouth daily. 90 tablet 0    fish oil/borage/flax/om3,6,9 1 (OMEGA 3-6-9 COMPLEX ORAL) Take 1 capsule by mouth daily.      fluoride, sodium, 1.1 % Gel Brush teeth with a pea-sized amount of the paste for 2 min and spit.  No rinsing. Do not eat or drink for 30 minutes after use. Use 2x/day. 56 g prn    fluoride, sodium, 1.1 % Gel Brush teeth with a pea-sized amount of the paste for 2 min and spit.  No rinsing. Do not eat or drink for 30 minutes after use. Use 2x/day. 56 g prn    glycopyrrolate-formoteroL (BEVESPI AEROSPHERE) 9-4.8 mcg HFAA inhaler Inhale 2 puffs Two (2) times a day. Use with spacer. 10.7 g 6    glycopyrrolate-formoteroL (BEVESPI AEROSPHERE) 9-4.8 mcg HFAA inhaler Inhale 2 puffs Two (2) times a  day. Use with spacer. 10.7 g 6    hydrOXYzine (ATARAX) 25 MG tablet Take 1 tablet (25 mg total) by mouth every six (6) hours as needed. 30 tablet 1    ibrutinib (IMBRUVICA) 280 mg tablet Take 2 tablets (560 mg total) by mouth daily. 56 tablet 11    ipratropium-albuteroL (DUO-NEB) 0.5-2.5 mg/3 mL nebulizer Inhale 3 mL by nebulization every six (6) hours as needed (wheezing, shortness of breath). (Patient not taking: Reported on 07/18/2022) 270 mL 11    lenalidomide (REVLIMID) 20 mg cap capsule Take 1 capsule by mouth daily for days 1-21 followed by 7 days rest 21 capsule 0    levETIRAcetam (KEPPRA) 500 MG tablet Take 1 tablet (500 mg total) by mouth Two (2) times a day. 60 tablet 4    memantine (NAMENDA) 10 MG tablet Take 1 tablet (10 mg total) by mouth Two (2) times a day. 60 tablet 4    metFORMIN (GLUCOPHAGE) 500 MG tablet Take 2 tablets (1,000 mg total) by mouth in the morning and 2 tablets (1,000 mg total) in the evening. Take with meals. 180 tablet 3    methylPREDNISolone (MEDROL DOSEPACK) 4 mg tablet follow package directions 21 each 0    mirtazapine (REMERON) 30 MG tablet Take 1 tablet (30 mg total) by mouth nightly. 90 tablet 0    multivitamin (TAB-A-VITE/THERAGRAN) per tablet Take 1 tablet by mouth daily.      nicotine polacrilex (NICORETTE) 4 MG gum Apply 1 each (4 mg total) to cheek every hour as needed for smoking cessation. Use according to the following 12-week dosing schedule: Weeks 1 to 6: Chew 1 piece of gum every 1 to 2 hours (maximum: 24 pieces/day); to increase chances of quitting, chew at least 9 pieces/day during the first 6 weeks. Weeks 7 to 9: Chew 1 piece of gum every 2 to 4 hours (maximum: 24 pieces/day). Weeks 10 to 12: Chew 1 piece of gum every 4 to 8 hours (maximum: 24 pieces/day). 100 each 11    nicotine polacrilex (NICORETTE) 4 MG gum Apply 1 each (4 mg total) to cheek every hour as needed for smoking cessation. Use according to the following 12-week dosing schedule: Weeks 1 to 6: Chew 1 piece of gum every 1 to 2 hours (maximum: 24 pieces/day); to increase chances of quitting, chew at least 9 pieces/day during the first 6 weeks. Weeks 7 to 9: Chew 1 piece of gum every 2 to 4 hours (maximum: 24 pieces/day). Weeks 10 to 12: Chew 1 piece of gum every 4 to 8 hours (maximum: 24 pieces/day). 100 each 11    OLANZapine (ZYPREXA) 5 MG tablet Take 1 tablet (5 mg total) by mouth nightly. 90 tablet 0    pantoprazole (PROTONIX) 40 MG tablet Take 1 tablet (40 mg total) by mouth daily. 30 tablet 1     No current facility-administered medications for this visit.     Facility-Administered Medications Ordered in Other Visits   Medication Dose Route Frequency Provider Last Rate Last Admin    OKAY TO SEND MEDICATION/CHEMOTHERAPY TO OUTPATIENT UNIT   Other Once Gae Bon, MD        sodium chloride (NS) 0.9 % infusion  100 mL/hr Intravenous Continuous Gae Bon, MD 100 mL/hr at 08/01/22 1022 100 mL/hr at 08/01/22 1022        ALLERGIES:  Allergies   Allergen Reactions    Sulfa (Sulfonamide Antibiotics) Rash     Developed during hospitalization d/c  Tegaderm Adhesive-No Drug-Allergy Check Other (See Comments)         LABORATORY DATA:  CMP  Recent Labs     08/01/22  0814   NA 138   K 4.1   CL 105   CO2 25.0   BUN 16   CREATININE 0.70*   GLU 154   CALCIUM 9.0   AST 18   ALT 35   ALKPHOS 71       Complete Blood Count   Recent Labs     08/01/22  0814   WBC 9.2   RBC 5.19   HGB 16.5   HCT 48.1*   MCV 92.7   MCH 31.8   MCHC 34.3   PLT 243   MPV 7.5       Differential (Absolute)   Recent Labs     08/01/22  0814 NEUTROABS 7.1   LYMPHSABS 1.5   MONOSABS 0.5   EOSABS 0.1   BASOSABS 0.0

## 2022-08-01 NOTE — Unmapped (Signed)
Infusion Progress Note    Patient Name: Andre Duncan  Patient Age: 56 y.o.  Encounter Date: 08/01/2022  Allergies   Allergen Reactions    Sulfa (Sulfonamide Antibiotics) Rash     Developed during hospitalization d/c     Tegaderm Adhesive-No Drug-Allergy Check Other (See Comments)       Reason for visit  Infusion Center visit   Today is CYCLE 1/ DAY 1 of Lymphoma Lenalidomide/Rituximab therapy.   Chief Complaint: Rigors   Oncologist: Dr. Renae Fickle        Assessment/Plan:  Hypersensitivity reaction Grade 1  --Pre-medications: Acetaminophen 650 mg, Diphenhydramine 50 mg   --Symptoms: Rigors  --Amount Infused:~300 mL  --Reaction medications: Famotidine 20 mg, Demerol 25 mg       Disposition: Upon reassessment he has had complete resolve of his symptoms and was able to take a nap in the infusion chair. Infusion nurse given instructions to restart infusion at half rate and increase per previous administration instructions.       Interval History/HPI:   Andre Duncan is a 56 yo male with relapsed PCNSL whom is in the infusion clinic to receive CYCLE 1/ DAY 1 of Lymphoma Lenalidomide/Rituximab therapy. He received approximately of Rituximab and was noted to be shivering. He stated to the infusion nurse he was cold. He did not have any other complaints at that time. VSS. Afebrile. Additional blankets were given to him. He continued to shiver. The infusion was stopped.     Upon my arrival patient had very mild rigors and appeared flushed in the face. VSS. He had no c/o hcest pain, SOB, swelling of the tongue or lips, abdominal or back pain. There is a preexisting skin rash for which his primary oncology team is managing. The skin rash has not changed since the initiation of the infusion. Patient given Famotidine 20 mg and Demerol 25 mg for grade 1 HSR.       ROS:  All other systems reviewed were negative.      Physical Exam:  There were no vitals filed for this visit.    General:    Healthy-appearing, resting in no acute distress in the recliner chair. Mild rigors present.   HEENT:   Normocephalic and atraumatic. No scleral icterus or conjunctival injection. Oral mucosa moist without ulceration, erythema, exudate or purpura.  Cardiovascular:    Regular rate, & rhythm. S1/S2 No significant murmurs or gallops.  Respiratory:   Bilateral breath sounds clear to auscultation without adventitious sounds.  No increased work of breathing noted.   Gastrointestinal:    Abdomen soft, nontender, non-distended.  Bowel sounds present in all quadrants.  Skin:   Grossly intact, warm/dry. Facial flushing. Macular skin rash to left arm noted.   Neuro:   Alert, oriented X4.      Results:  Recent Labs     08/01/22  0814   WBC 9.2   HGB 16.5   HCT 48.1*   PLT 243   NEUTROABS 7.1   LYMPHSABS 1.5   MONOSABS 0.5   EOSABS 0.1   BASOSABS 0.0     Recent Labs     08/01/22  0814   NA 138   K 4.1   CL 105   CO2 25.0   BUN 16   CREATININE 0.70*   GLU 154   CALCIUM 9.0   ALBUMIN 3.8   PROT 7.2   BILITOT 0.4   AST 18   ALT 35   ALKPHOS 71  I personally spent 35 minutes face-to-face and non-face-to-face in the care of this patient, which includes all pre, intra, and post visit time on the date of service.         Montel Culver, ANP  ADVANCED PRACTICE PROVIDER FOR INFUSION PROGRAM

## 2022-08-02 NOTE — Unmapped (Signed)
Lab on 08/01/2022   Component Date Value Ref Range Status    Sodium 08/01/2022 138  135 - 145 mmol/L Final    Potassium 08/01/2022 4.1  3.4 - 4.8 mmol/L Final    Chloride 08/01/2022 105  98 - 107 mmol/L Final    CO2 08/01/2022 25.0  20.0 - 31.0 mmol/L Final    Anion Gap 08/01/2022 8  5 - 14 mmol/L Final    BUN 08/01/2022 16  9 - 23 mg/dL Final    Creatinine 16/07/9603 0.70 (L)  0.73 - 1.18 mg/dL Final    BUN/Creatinine Ratio 08/01/2022 23   Final    eGFR CKD-EPI (2021) Male 08/01/2022 >90  >=60 mL/min/1.31m2 Final    eGFR calculated with CKD-EPI 2021 equation in accordance with [employer] and [employer] of Nephrology Task Force recommendations.    Glucose 08/01/2022 154  70 - 179 mg/dL Final    Calcium 54/06/8118 9.0  8.7 - 10.4 mg/dL Final    Albumin 14/78/2956 3.8  3.4 - 5.0 g/dL Final    Total Protein 08/01/2022 7.2  5.7 - 8.2 g/dL Final    Total Bilirubin 08/01/2022 0.4  0.3 - 1.2 mg/dL Final    AST 21/30/8657 18  <=34 U/L Final    ALT 08/01/2022 35  10 - 49 U/L Final    Alkaline Phosphatase 08/01/2022 71  46 - 116 U/L Final    WBC 08/01/2022 9.2  3.6 - 11.2 10*9/L Final    RBC 08/01/2022 5.19  4.26 - 5.60 10*12/L Final    HGB 08/01/2022 16.5  12.9 - 16.5 g/dL Final    HCT 84/69/6295 48.1 (H)  39.0 - 48.0 % Final    MCV 08/01/2022 92.7  77.6 - 95.7 fL Final    MCH 08/01/2022 31.8  25.9 - 32.4 pg Final    MCHC 08/01/2022 34.3  32.0 - 36.0 g/dL Final    RDW 28/41/3244 14.0  12.2 - 15.2 % Final    MPV 08/01/2022 7.5  6.8 - 10.7 fL Final    Platelet 08/01/2022 243  150 - 450 10*9/L Final    Neutrophils % 08/01/2022 76.7  % Final    Lymphocytes % 08/01/2022 16.1  % Final    Monocytes % 08/01/2022 5.6  % Final    Eosinophils % 08/01/2022 1.4  % Final    Basophils % 08/01/2022 0.2  % Final    Absolute Neutrophils 08/01/2022 7.1  1.8 - 7.8 10*9/L Final    Absolute Lymphocytes 08/01/2022 1.5  1.1 - 3.6 10*9/L Final    Absolute Monocytes 08/01/2022 0.5  0.3 - 0.8 10*9/L Final    Absolute Eosinophils 08/01/2022 0.1  0.0 - 0.5 10*9/L Final    Absolute Basophils 08/01/2022 0.0  0.0 - 0.1 10*9/L Final

## 2022-08-02 NOTE — Unmapped (Signed)
Encounter addended by: Caryl Comes, RN on: 08/01/2022 5:19 PM   Actions taken: Flowsheet accepted, LDA properties accepted

## 2022-08-03 LAB — HEPATITIS B SURFACE ANTIBODY
HEPATITIS B SURFACE ANTIBODY QUANT: <8 m[IU]/mL (ref <8.00)
HEPATITIS B SURFACE ANTIBODY: NONREACTIVE

## 2022-08-03 LAB — HEPATITIS B CORE ANTIBODY, TOTAL: HEPATITIS B CORE TOTAL ANTIBODY: NONREACTIVE

## 2022-08-03 LAB — HEPATITIS B SURFACE ANTIGEN: HEPATITIS B SURFACE ANTIGEN: NONREACTIVE

## 2022-08-04 DIAGNOSIS — C8330 Diffuse large B-cell lymphoma, unspecified site: Principal | ICD-10-CM

## 2022-08-04 NOTE — Unmapped (Signed)
Specialty Medication(s): Imbruvica    Mr.Bajaj has been dis-enrolled from the [facility] specialty pharmacy services due to  medication therapy change and now filling a different pharmacy .    Additional information provided to the patient: NA    Kermit Balo, [facility]  [facility] Specialty Pharmacist

## 2022-08-05 DIAGNOSIS — Z006 Encounter for examination for normal comparison and control in clinical research program: Principal | ICD-10-CM

## 2022-08-05 DIAGNOSIS — C8589 Other specified types of non-Hodgkin lymphoma, extranodal and solid organ sites: Principal | ICD-10-CM

## 2022-08-08 ENCOUNTER — Institutional Professional Consult (permissible substitution): Admit: 2022-08-08 | Discharge: 2022-08-09 | Payer: PRIVATE HEALTH INSURANCE | Attending: Oncology | Primary: Oncology

## 2022-08-08 DIAGNOSIS — C8330 Diffuse large B-cell lymphoma, unspecified site: Principal | ICD-10-CM

## 2022-08-13 NOTE — Unmapped (Addendum)
Cellular Therapy Research Note  Date: 08/14/2022    Clinical Study: [facility] 1813-ATL: A Phase I Study of Autologous Activated T-Cells Targeting the CD19 Antigen and Containing the Inducible Caspase 9 Safety Switch in Subjects with Relapsed/Refractory B-cell Lymphoma and Chronic Lymphocytic Leukemia/ Small Lymphocytic Lymphoma, IRB# [id number]    Protocol Day: Pre-procurement visit    Performance Status: kps/ecog: KPS = 80 - Normal activity with effort; some signs or symptoms of disease. / ECOG equivalent 1       Research Assessment & Plan:  Andre Duncan is a 56 y.o. male with a previous medical history significant for bilateral inguinal hernias, GERD, prostate atrophy, diabetes mellitus, COPD, anxiety and depression.     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 He received 4 cycles of the MATRIX regimen (methotrexate, cytarabine, thiotepa and Rituximab) from 9/2 to 08/10/2020 and achieved a partial remission, then continued on with 3 additional cycles of single agent methotrexate. He later received 2340 cGy of whole brain radiation (180 cGy x 13 fractions) and subsequently achieved a CR. He was not a candidate to proceed with autologous stem cell transplant at that time, due to poor PFTs and active smoking. Unfortunately, a 08/15/21 brain MRI showed progression and he had seizure-like activity on 09/05/21, so was started on Keppra at that time. He received 5 fractions of Cyberknife radiation to involved areas of his brain, completing 09/06/21, and was then started on Ibrutinib at that time. He continued to get surveillance brain MRIs (2/1, 4/6, 6/7, 9/14, and 07/10/22); unfortunately, his 07/10/22 brain MRI did exhibit new right parietal lobe lesions, consistent with disease progression.    He discontinued his ibrutinib and started on lenalidomide (20 mg x 21 days, 7 days off) on 07/26/22. He received his first dose of Rituximab on 08/01/22. Since starting on lenalidomide, he's experienced a drug-related rash (starting 07/27/22), which had resolved on methylprednisolone but has since returned.    He reports today, 08/14/22, for evaluation by Dr. Renae Fickle, who performed a review of systems and physical examination. I met with him and his wife prior to, during and after the visit to discuss consent for the [facility] 1813 trial and to review the patient's interval history.    At his visit today, the patient reports the following baseline conditions:    - Confusion, agitation and memory impairment: Following relapse of his PCNSL in November, 2022, patient presented to the ED after a syncopal episode and his wife noticed worsening confusion following the incident. He fell on 09/20/21 and lost consciousness again.   - He's seeing Dr. Sabino Niemann in Neurology and was last seen 07/18/22.  - He received whole brain radiation in Apr, 2022; was started on memantine for neurocognitive protection, which both he and his wife feel has been helpful    - Since 2022, he's exhibited several cognitive deficits, such as word finding difficulty, memory impairment, and spatial-visual deficits    - Patient's wife reports more recent (~Sept, 2023) episodes of confusion and irritability    - His wife has previously reported episodes of irritability/ agitation following whole brain radiation in 2022, but again noted worsening irritability as late as Sept, 2023   - At visit today (11/9), Dr. Elmon Kirschner and I evaluated patient's mental status and decision-making capacity; at present there is no concern for patient's capacity to make medical decisions for himself (including consent to a clinical trial); an MMSE was  performed and was considered normal (score of 26/30); we do not believe that his baseline cognition will make it difficult to assess cell therapy mediated neurotoxicity     - Since starting lenalidomide and Rituximab, patient's wife reports improvement in confusion and fewer episodes of agitation/ irritability       - Rash, NOS: Patient first reported a deep red rash on his right forearm, then his right axilla, shoulder, lower back and abdomen. He was prescribed methylprednisolone dose pak on 08/01/22 and has since finished, with his last dose on Thursday, 08/07/22. His rash had largely resolved while on methylprednisolone but has since returned. It is present on his arms and trunk, into his groin area, on his right side. He reports associated pruritus.    The patient acknowledges the following ongoing medical history/ issues:  - Prostate enlargement: Patient's wife reports he's been taking finasteride since 2015. They report he was diagnosed with bilateral inguinal hernias which resolved after he wore a support belt for a period of a month.  - COPD: The patient and his wife report that he was diagnosed in 2022, but there was documentation of COPD at the time of original diagnosis in Aug, 2021. He is a long time smoker.  PFT's in March, 2022, showed reduced DLCO.  - Diabetes mellitus: Diagnosed in 2022; patient is managing with metformin presently. Glucose levels to be well controlled with recent visits.  - Anorexia: Patient reports a reduced appetite since the time of his diagnosis. He reported a 45-pound weight loss following initial diagnosis and treatment on the MATRIX protocol. He's been using marinol with good effect and has maintained a somewhat steady weight over the last 6 months (3 kg weight loss).  - GERD: Patient and his wife note acid reflux starting during MATRIX treatment (Sept, 2021). He reports these symptoms are well controlled on pantoprazole.  - Anxiety and depression: Patient is taking escitalopram for anxiety and olanzapine and mirtazapine for depression.    - Cytarabine intolerance/ poisoning: Of note, patient has reported pulmonary and neurotoxicity associated with high-dose cytarabine (given with MATRIX regimen).    Plan: Patient has not yet consented to the clinical trial but will complete an ECHO today to evaluate baseline cardiac function (EF). Tomorrow, 08/15/22, we will consent the patient to the procurement ICF using an in-person Arabic interpreter. Following consent, we'll draw remaining screening labs, including viral serologies. He is scheduled for a whole blood collection tomorrow, 08/15/22, at 10:00 AM.    Upon collection, if his CAR-T cells are able to be successfully manufactured, he will return (in 4-6 weeks) for LD chemo and CAR-T infusion. He will continue to receive bridging therapy with lenalidomide and Rituximab in the interval.        Prior Cancer Treatment History   Initial diagnosis: Primary CNS lymphoma (Large B-cell lymphoma) + date 06/03/2020      Prior Surgeries:    - Right posterior parietal brain biopsy- 06/03/2020    Prior treatments:  Regimen Date Started Date Ended # Cycles Best Response + Date Date of PD   1A) MATRIX (methotrexate, cytarabine, thiotepa, and Rituximab) 06/07/2020 08/10/20 4 PR N/A   1B) Methotrexate (single agent) 10/09/20 11/30/20 3 PR (near CR) 08/15/21   2B) Ibrutinib (560 mg daily) 09/06/2021 07/23/22  CR 07/10/22   3) Lenalidomide (20 mg x 21 days, 7 days off) + Rituximab 07/26/22 Ongoing 1 N/A N/A             Prior Radiation:  Type of  Radiation Anatomical location Start Date End Date Dose # of Fractions   1C) Whole brain radiation Brain 01/17/21 02/04/21 2340 cGy (total) 13 fractions (180 cGy each)   2A) Cyberknife Brain 09/02/21 09/06/21 2500 cGy (total) 5 fractions (500 cGy each)                              Pertinent Medical History:  Medical History Grade Date Started Date Ended Medication   Bilateral inguinal hernias UNK ~2015 ~2015 None   Prostate enlargement UNK ~2015 Ongoing Finasteride   Respiratory, thoracic and mediastinal disorders- other: COPD 2 ~Aug, 2021 Ongoing Albuterol, glycopyrrolate, ipratropium-albuterol inhalers   Memory impairment 2 ~summer, 2021 Ongoing Memantine   Confusion 1 ~summer, 2021 June, 2022 (worsened) Memantine   Anorexia 2 ~Aug, 2021 Ongoing Dronabinol (Marinol)   GERD 2 ~Sept, 2021 Ongoing Pantoprazole   Anxiety 2 UNK Ongoing Escitalopram oxalate   Depression 1 UNK Ongoing olanzapine, mirtazapine   Metabolism and nutrition disorders- other: Diabetes mellitus 2 ~ 2022 Ongoing Metformin   Confusion 2 ~June, 2022 Ongoing Memantine   Agitation 2 ~July, 2022 Ongoing None   Rash, NOS 2 ~07/27/2022 Ongoing Methylprednisolone (finished)       Adverse Event Log  AE Date Started Date Ended Grade Attribution to procurement Attribution to lymphodepletion Attribution to CAR-T infusion Clinically Significant?  (Y / N)                                                                                                       All lab values and assessments were reviewed by the investigator and are considered not clinically significant unless otherwise noted.    CRS / ICANS Log  Toxicity / Grade / Dates Symptoms related to CRS/ICANS  (Not listed in AE log) Intervention  (Toci, Steroids,   Rimiducid, etc)                           Prior & Concomitant Medication Log  - Patient currently taking the following meds:   - For hx of COPD:     - Albuterol HFA (90 mcg/ actuation) inhaler    - Glycopyrrolate- formoterol (Bevespi aerosphere)- 2 puffs BID    - Ipratropium- albuterol (DUO-NEB)- 3 mL by nebulation q 6 hrs PRN     - For general health    - Cholecalciferol, vitamin D3-50 mcg, 2000 unit, 50 mcg- 1 tab daily    - Fish oil/ borage/ flax/ omega-3, -6, -9- 1 cap daily    - Multivitamin (TAB-A-VITE/ Theragran)- 1 tab daily   - For appetite    - DroNABinol (MARINOL)- 2.5 mg x 2 caps, or  5 mg; BID   - For anxiety:    - Escitalopram oxalate (Lexapro) 20 mg tab daily; PO   - For depression:    - Olanzapine (Zyprexa)- 5 mg tab nightly; PO    - Mirtazapine (Remeron)- 30 mg tab nightly; PO   - For prostate:    - Finasteride (Proscar)- 5  mg daily   - For diabetes:    - Metformin (Glucophage)- 1 tab (500 mg) BID; PO- Chart is incorrect   - For seizure prophylaxis:    - Levetiracetam (Keppra)- 500 mg tab BID; PO- On for 2 years   - For GERD prevention:    - Pantoprazole (Protonix)- 40 mg daily; PO   - For neurocognitive prophylaxis:    - Memantine (Namenda)- 1 tab; BID; PO            Medication Dose Route Start Date Stop Date Indication Related Med Hx or AE, if any                                                                                                 Eligibility Criteria (Not Otherwise Documented):    Serum creatinine: 0.68 mg/dL (L) 16/10/96 0454  Estimated creatinine clearance: 138.4 mL/min (A)     - Subject has no prior or concurrent second malignancies that has the potential to interfere with the safety or efficacy assessment of the investigational drug  - Subject's disease is localized to brain; he does not have a tumor in a location that could cause airway obstruction  - Subject's disease is measurable by response criteria for primary CNS lymphoma; discussed with Dr. Elmon Kirschner, who agreed enhancing lesions < 1 cm did, indeed, constitute measurable disease  - Subject's Hep B testing from 08/01/22 returned as nonreactive; viral serologies for Hep C, HIV and HTLV will be drawn 08/15/22 and will be pending at time of procurement  - Subject has no known exposure to, and therefore, no known history of intolerance to fludarabine; of note, patient has had prior intolerance to cytarabine: as part of his MATRIX regimen in 2021, he received high-dose (2 g/m2) cytarabine and experienced possible cytarabine-induced pneumonitis; this was discussed with Dr. Elmon Kirschner and clinical pharmacists Rulon Abide and Eliezer Bottom on 08/14/22 and it is felt that fludarabine has a low risk of causing similar toxicity for the following reasons: 1) fludarabine will be given at much lower doses; 2) Fludarabine and cytarabine share a small portion of molecule that is same but are distinct structures; and 3) pneumonitis has only been reported in < 6% of patients receiving fludarabine  - Subject's life expectancy > 12 weeks

## 2022-08-14 ENCOUNTER — Ambulatory Visit: Admit: 2022-08-14 | Discharge: 2022-08-15 | Payer: PRIVATE HEALTH INSURANCE

## 2022-08-14 ENCOUNTER — Other Ambulatory Visit: Admit: 2022-08-14 | Discharge: 2022-08-15 | Payer: PRIVATE HEALTH INSURANCE

## 2022-08-14 ENCOUNTER — Institutional Professional Consult (permissible substitution): Admit: 2022-08-14 | Discharge: 2022-08-15 | Payer: PRIVATE HEALTH INSURANCE

## 2022-08-14 DIAGNOSIS — Z006 Encounter for examination for normal comparison and control in clinical research program: Principal | ICD-10-CM

## 2022-08-14 DIAGNOSIS — C8589 Other specified types of non-Hodgkin lymphoma, extranodal and solid organ sites: Principal | ICD-10-CM

## 2022-08-15 ENCOUNTER — Ambulatory Visit: Admit: 2022-08-15 | Discharge: 2022-08-16 | Payer: PRIVATE HEALTH INSURANCE

## 2022-08-15 ENCOUNTER — Institutional Professional Consult (permissible substitution): Admit: 2022-08-15 | Discharge: 2022-08-16 | Payer: PRIVATE HEALTH INSURANCE

## 2022-08-15 ENCOUNTER — Encounter: Admit: 2022-08-15 | Discharge: 2022-08-16 | Payer: PRIVATE HEALTH INSURANCE

## 2022-08-15 DIAGNOSIS — C8589 Other specified types of non-Hodgkin lymphoma, extranodal and solid organ sites: Principal | ICD-10-CM

## 2022-08-15 MED ORDER — REVLIMID 20 MG CAPSULE
ORAL_CAPSULE | 0 refills | 0 days
Start: 2022-08-15 — End: ?

## 2022-08-19 DIAGNOSIS — C8589 Other specified types of non-Hodgkin lymphoma, extranodal and solid organ sites: Principal | ICD-10-CM

## 2022-08-19 MED ORDER — LENALIDOMIDE 20 MG CAPSULE
ORAL_CAPSULE | 0 refills | 0 days
Start: 2022-08-19 — End: ?

## 2022-08-22 MED ORDER — LENALIDOMIDE 20 MG CAPSULE
ORAL_CAPSULE | 0 refills | 0 days | Status: CP
Start: 2022-08-22 — End: ?

## 2022-08-25 ENCOUNTER — Ambulatory Visit: Admit: 2022-08-25 | Discharge: 2022-08-26 | Payer: PRIVATE HEALTH INSURANCE

## 2022-08-25 MED ORDER — METFORMIN 500 MG TABLET
ORAL_TABLET | Freq: Two times a day (BID) | ORAL | 0 refills | 90 days | Status: CP
Start: 2022-08-25 — End: 2023-08-25

## 2022-08-26 DIAGNOSIS — Z006 Encounter for examination for normal comparison and control in clinical research program: Principal | ICD-10-CM

## 2022-08-26 DIAGNOSIS — C8589 Other specified types of non-Hodgkin lymphoma, extranodal and solid organ sites: Principal | ICD-10-CM

## 2022-09-03 ENCOUNTER — Ambulatory Visit: Admit: 2022-09-03 | Discharge: 2022-09-04 | Payer: PRIVATE HEALTH INSURANCE

## 2022-09-08 ENCOUNTER — Ambulatory Visit: Admit: 2022-09-08 | Discharge: 2022-09-09 | Payer: PRIVATE HEALTH INSURANCE

## 2022-09-08 DIAGNOSIS — E785 Hyperlipidemia, unspecified: Principal | ICD-10-CM

## 2022-09-08 DIAGNOSIS — F419 Anxiety disorder, unspecified: Principal | ICD-10-CM

## 2022-09-08 DIAGNOSIS — N400 Enlarged prostate without lower urinary tract symptoms: Principal | ICD-10-CM

## 2022-09-08 DIAGNOSIS — Z125 Encounter for screening for malignant neoplasm of prostate: Principal | ICD-10-CM

## 2022-09-08 DIAGNOSIS — D709 Neutropenia, unspecified: Principal | ICD-10-CM

## 2022-09-08 DIAGNOSIS — F32A Anxiety and depression: Principal | ICD-10-CM

## 2022-09-08 DIAGNOSIS — Z0000 Encounter for general adult medical examination without abnormal findings: Principal | ICD-10-CM

## 2022-09-08 DIAGNOSIS — E1169 Type 2 diabetes mellitus with other specified complication: Principal | ICD-10-CM

## 2022-09-08 DIAGNOSIS — G4700 Insomnia, unspecified: Principal | ICD-10-CM

## 2022-09-08 DIAGNOSIS — Z1211 Encounter for screening for malignant neoplasm of colon: Principal | ICD-10-CM

## 2022-09-08 DIAGNOSIS — Z1329 Encounter for screening for other suspected endocrine disorder: Principal | ICD-10-CM

## 2022-09-08 DIAGNOSIS — C8330 Diffuse large B-cell lymphoma, unspecified site: Principal | ICD-10-CM

## 2022-09-08 DIAGNOSIS — J439 Emphysema, unspecified: Principal | ICD-10-CM

## 2022-09-10 DIAGNOSIS — C8589 Other specified types of non-Hodgkin lymphoma, extranodal and solid organ sites: Principal | ICD-10-CM

## 2022-09-10 MED ORDER — LENALIDOMIDE 20 MG CAPSULE
ORAL_CAPSULE | 0 refills | 0 days
Start: 2022-09-10 — End: ?

## 2022-09-15 DIAGNOSIS — E785 Hyperlipidemia, unspecified: Principal | ICD-10-CM

## 2022-09-15 MED ORDER — EZETIMIBE 10 MG TABLET
ORAL_TABLET | Freq: Every day | ORAL | 3 refills | 90 days | Status: CP
Start: 2022-09-15 — End: 2023-09-15

## 2022-09-16 ENCOUNTER — Ambulatory Visit: Admit: 2022-09-16 | Discharge: 2022-09-17 | Payer: PRIVATE HEALTH INSURANCE

## 2022-09-17 ENCOUNTER — Ambulatory Visit: Admit: 2022-09-17 | Discharge: 2022-09-18 | Payer: PRIVATE HEALTH INSURANCE

## 2022-09-17 ENCOUNTER — Institutional Professional Consult (permissible substitution): Admit: 2022-09-17 | Discharge: 2022-09-18 | Payer: PRIVATE HEALTH INSURANCE

## 2022-09-17 ENCOUNTER — Other Ambulatory Visit: Admit: 2022-09-17 | Discharge: 2022-09-18 | Payer: PRIVATE HEALTH INSURANCE

## 2022-09-17 ENCOUNTER — Ambulatory Visit
Admit: 2022-09-17 | Discharge: 2022-09-18 | Payer: PRIVATE HEALTH INSURANCE | Attending: Pharmacist Clinician (PhC)/ Clinical Pharmacy Specialist | Primary: Pharmacist Clinician (PhC)/ Clinical Pharmacy Specialist

## 2022-09-17 DIAGNOSIS — C8589 Other specified types of non-Hodgkin lymphoma, extranodal and solid organ sites: Principal | ICD-10-CM

## 2022-09-17 DIAGNOSIS — Z006 Encounter for examination for normal comparison and control in clinical research program: Principal | ICD-10-CM

## 2022-09-18 DIAGNOSIS — C8589 Other specified types of non-Hodgkin lymphoma, extranodal and solid organ sites: Principal | ICD-10-CM

## 2022-09-18 MED ORDER — LENALIDOMIDE 20 MG CAPSULE
ORAL_CAPSULE | 0 refills | 0.00000 days | Status: CP
Start: 2022-09-18 — End: 2022-09-18

## 2022-09-18 NOTE — Unmapped (Signed)
.  Hi,     Deovion's spouse [name] contacted the [employer] requesting to speak with the care team of Levere Fahrer to discuss:    She is calling in reference to her spouses Revlimid 20mg  daily. She is wanting to know to know what is the treatment plan for him while he is taking this medication. She is afraid if his treatment plan changes he may not be able to join the study so she has several questions.     Please contact [name] back  at [phone] asap.        Thank you,   Noland Fordyce  [facility]   [phone]

## 2022-09-19 ENCOUNTER — Ambulatory Visit: Admit: 2022-09-19 | Discharge: 2022-09-20 | Payer: PRIVATE HEALTH INSURANCE

## 2022-09-20 ENCOUNTER — Ambulatory Visit: Admit: 2022-09-20 | Discharge: 2022-09-21 | Payer: PRIVATE HEALTH INSURANCE

## 2022-09-21 ENCOUNTER — Ambulatory Visit: Admit: 2022-09-21 | Discharge: 2022-09-22 | Payer: PRIVATE HEALTH INSURANCE

## 2022-09-22 DIAGNOSIS — F419 Anxiety disorder, unspecified: Principal | ICD-10-CM

## 2022-09-22 DIAGNOSIS — F32A Anxiety and depression: Principal | ICD-10-CM

## 2022-09-22 DIAGNOSIS — C8330 Diffuse large B-cell lymphoma, unspecified site: Principal | ICD-10-CM

## 2022-09-22 MED ORDER — OLANZAPINE 5 MG TABLET
ORAL_TABLET | Freq: Every evening | ORAL | 1 refills | 90 days
Start: 2022-09-22 — End: 2023-09-22

## 2022-09-22 MED ORDER — MIRTAZAPINE 30 MG TABLET
ORAL_TABLET | Freq: Every evening | ORAL | 1 refills | 90 days
Start: 2022-09-22 — End: 2023-09-22

## 2022-09-22 MED ORDER — PANTOPRAZOLE 40 MG TABLET,DELAYED RELEASE
ORAL_TABLET | Freq: Every day | ORAL | 1 refills | 30 days
Start: 2022-09-22 — End: 2023-09-22

## 2022-09-23 DIAGNOSIS — C8589 Other specified types of non-Hodgkin lymphoma, extranodal and solid organ sites: Principal | ICD-10-CM

## 2022-09-23 MED ORDER — DRONABINOL 2.5 MG CAPSULE
ORAL_CAPSULE | Freq: Two times a day (BID) | ORAL | 0 refills | 60 days | Status: CP
Start: 2022-09-23 — End: 2022-11-22

## 2022-09-23 MED ORDER — PANTOPRAZOLE 40 MG TABLET,DELAYED RELEASE
ORAL_TABLET | Freq: Every day | ORAL | 1 refills | 30 days | Status: CP
Start: 2022-09-23 — End: 2023-09-23

## 2022-09-24 ENCOUNTER — Ambulatory Visit
Admit: 2022-09-24 | Discharge: 2022-09-24 | Disposition: A | Discharge status: Home / Self Care | Payer: PRIVATE HEALTH INSURANCE

## 2022-09-24 ENCOUNTER — Emergency Department
Admit: 2022-09-24 | Discharge: 2022-09-24 | Disposition: A | Discharge status: Home / Self Care | Payer: PRIVATE HEALTH INSURANCE

## 2022-09-24 DIAGNOSIS — U071 COVID-19: Principal | ICD-10-CM

## 2022-09-24 DIAGNOSIS — K4090 Unilateral inguinal hernia, without obstruction or gangrene, not specified as recurrent: Principal | ICD-10-CM

## 2022-09-24 MED ORDER — PAXLOVID 300 MG (150 MG X 2)-100 MG TABLETS IN A DOSE PACK
ORAL_TABLET | 0 refills | 0 days | Status: CP
Start: 2022-09-24 — End: 2022-09-24

## 2022-09-24 MED ORDER — MIRTAZAPINE 30 MG TABLET
ORAL_TABLET | Freq: Every evening | ORAL | 1 refills | 90 days | Status: CP
Start: 2022-09-24 — End: 2023-09-24

## 2022-09-24 MED ORDER — OLANZAPINE 5 MG TABLET
ORAL_TABLET | Freq: Every evening | ORAL | 1 refills | 90 days | Status: CP
Start: 2022-09-24 — End: 2023-09-24

## 2022-09-24 MED ORDER — VALACYCLOVIR 500 MG TABLET
ORAL_TABLET | Freq: Two times a day (BID) | ORAL | 11 refills | 30 days | Status: CP
Start: 2022-09-24 — End: 2022-10-24

## 2022-09-25 DIAGNOSIS — Z006 Encounter for examination for normal comparison and control in clinical research program: Principal | ICD-10-CM

## 2022-09-26 ENCOUNTER — Ambulatory Visit: Admit: 2022-09-26 | Discharge: 2022-09-27 | Payer: PRIVATE HEALTH INSURANCE

## 2022-09-26 ENCOUNTER — Ambulatory Visit: Admit: 2022-09-26 | Payer: PRIVATE HEALTH INSURANCE

## 2022-09-26 ENCOUNTER — Ambulatory Visit: Admit: 2022-09-26 | Payer: PRIVATE HEALTH INSURANCE | Attending: Family | Primary: Family

## 2022-09-26 ENCOUNTER — Encounter
Admit: 2022-09-26 | Discharge: 2022-09-27 | Payer: PRIVATE HEALTH INSURANCE | Attending: Student in an Organized Health Care Education/Training Program | Primary: Student in an Organized Health Care Education/Training Program

## 2022-09-26 DIAGNOSIS — C8589 Other specified types of non-Hodgkin lymphoma, extranodal and solid organ sites: Principal | ICD-10-CM

## 2022-09-26 DIAGNOSIS — Z9285 S/P chimeric antigen receptor T-cell therapy: Principal | ICD-10-CM

## 2022-09-27 MED ORDER — LEVOFLOXACIN 500 MG TABLET
ORAL_TABLET | ORAL | 0 refills | 30 days | Status: CP
Start: 2022-09-27 — End: ?
  Filled 2022-09-27: qty 30, 30d supply, fill #0

## 2022-09-27 MED ORDER — FLUCONAZOLE 200 MG TABLET
ORAL_TABLET | Freq: Every day | ORAL | 0 refills | 30 days | Status: CP
Start: 2022-09-27 — End: ?
  Filled 2022-09-27: qty 60, 30d supply, fill #0

## 2022-09-30 ENCOUNTER — Other Ambulatory Visit: Admit: 2022-09-30 | Discharge: 2022-09-30 | Payer: PRIVATE HEALTH INSURANCE

## 2022-09-30 ENCOUNTER — Ambulatory Visit: Admit: 2022-09-30 | Discharge: 2022-09-30 | Payer: PRIVATE HEALTH INSURANCE | Attending: Family | Primary: Family

## 2022-09-30 DIAGNOSIS — C8589 Other specified types of non-Hodgkin lymphoma, extranodal and solid organ sites: Principal | ICD-10-CM

## 2022-09-30 DIAGNOSIS — Z9285 History of chimeric antigen receptor T-cell therapy: Principal | ICD-10-CM

## 2022-09-30 DIAGNOSIS — U071 COVID-19 in immunocompromised patient (CMS-HCC): Principal | ICD-10-CM

## 2022-09-30 DIAGNOSIS — D849 Immunodeficiency, unspecified: Principal | ICD-10-CM

## 2022-10-04 ENCOUNTER — Ambulatory Visit
Admit: 2022-10-04 | Discharge: 2022-10-10 | Disposition: A | Discharge status: Home / Self Care | Payer: PRIVATE HEALTH INSURANCE | Admitting: Internal Medicine

## 2022-10-04 ENCOUNTER — Encounter
Admit: 2022-10-04 | Discharge: 2022-10-10 | Payer: PRIVATE HEALTH INSURANCE | Attending: Student in an Organized Health Care Education/Training Program

## 2022-10-04 ENCOUNTER — Encounter
Admit: 2022-10-04 | Discharge: 2022-10-10 | Disposition: A | Discharge status: Home / Self Care | Payer: PRIVATE HEALTH INSURANCE | Attending: Student in an Organized Health Care Education/Training Program | Admitting: Internal Medicine

## 2022-10-04 ENCOUNTER — Ambulatory Visit: Admit: 2022-10-04 | Discharge: 2022-10-10 | Payer: PRIVATE HEALTH INSURANCE

## 2022-10-04 DIAGNOSIS — Z9285 History of chimeric antigen receptor T-cell therapy: Principal | ICD-10-CM

## 2022-10-05 MED ORDER — LEVETIRACETAM 500 MG TABLET
ORAL_TABLET | Freq: Two times a day (BID) | ORAL | 4 refills | 30 days
Start: 2022-10-05 — End: ?

## 2022-10-07 MED ORDER — LEVETIRACETAM 500 MG TABLET
ORAL_TABLET | Freq: Two times a day (BID) | ORAL | 4 refills | 30 days | Status: CP
Start: 2022-10-07 — End: ?

## 2022-10-10 DIAGNOSIS — C8590 Non-Hodgkin lymphoma, unspecified, unspecified site: Principal | ICD-10-CM

## 2022-10-10 IMAGING — US US RENAL
1 series · 14 of 25 positions shown · non-contrast
Comparison: None.

CLINICAL DATA: Pyelonephritis.

EXAM:
RENAL / URINARY TRACT ULTRASOUND COMPLETE

[Series 1: us renal · 14 of 56 slices shown]
[im 1/56]
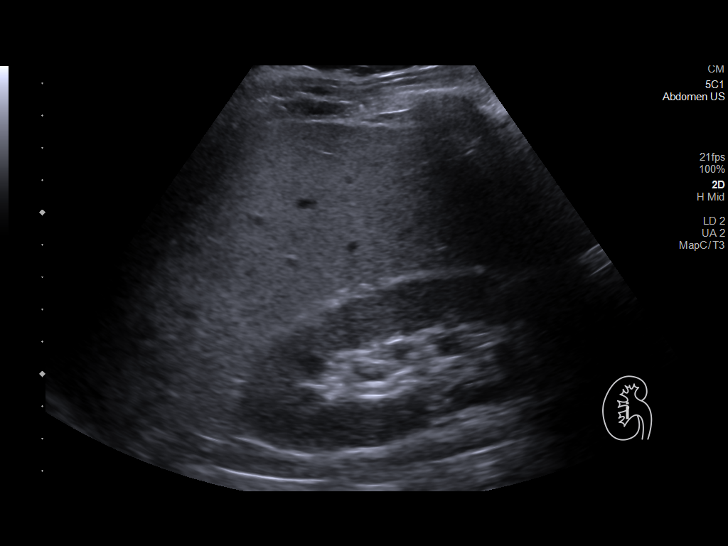
[im 5/56]
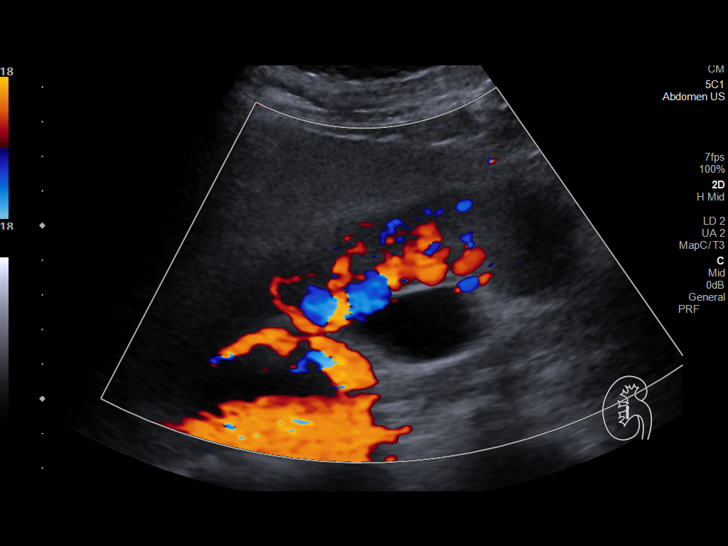
[im 10/56]
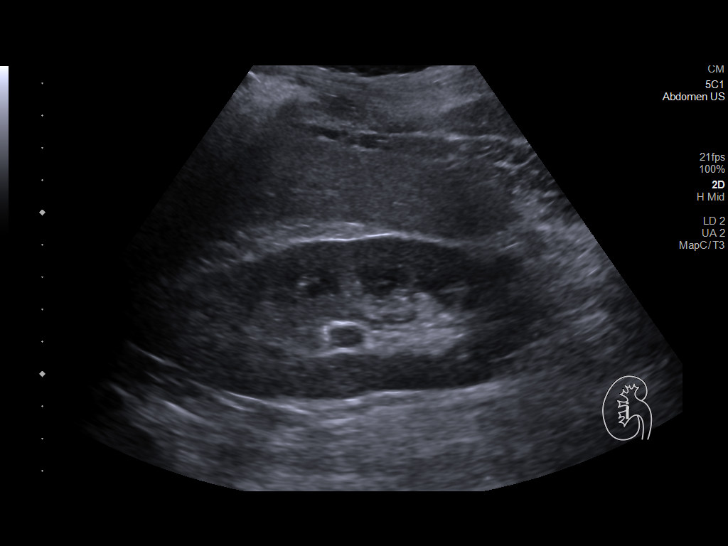
[im 14/56]
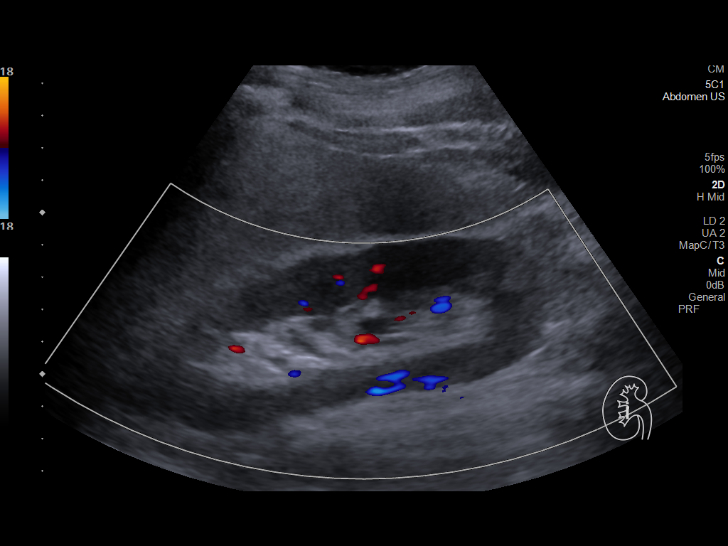
[im 19/56]
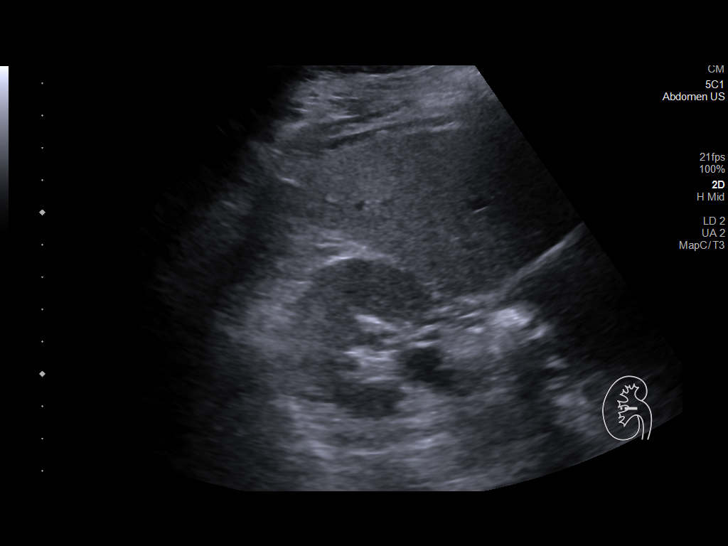
[im 21/56]
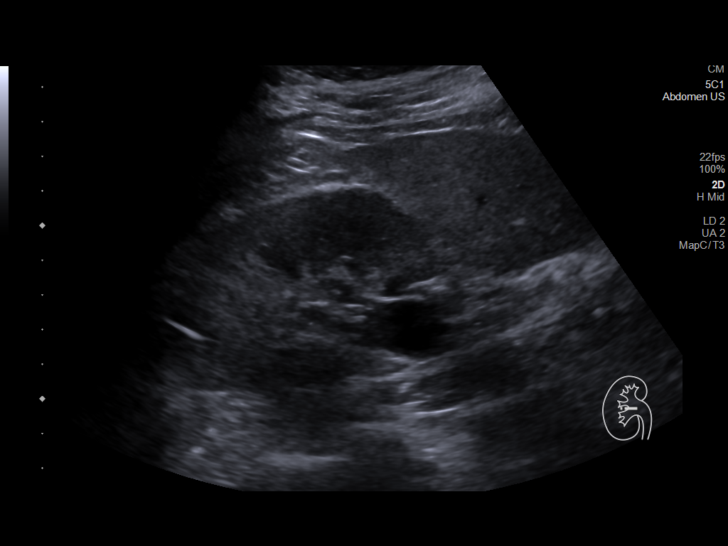
[im 26/56]
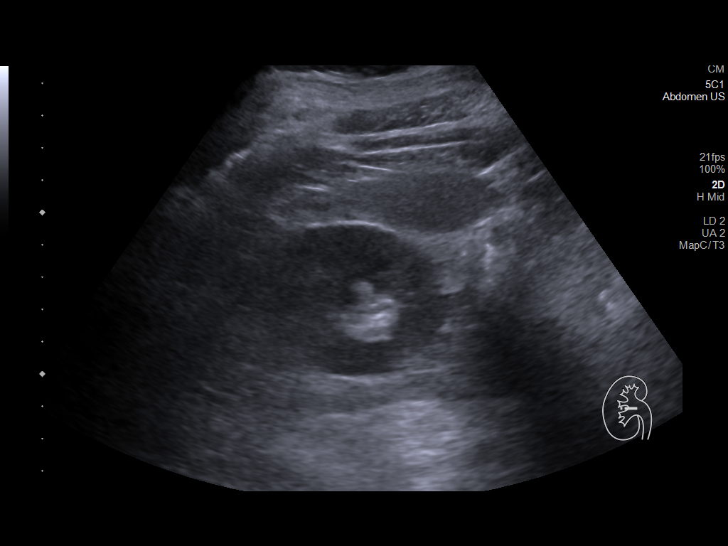
[im 30/56]
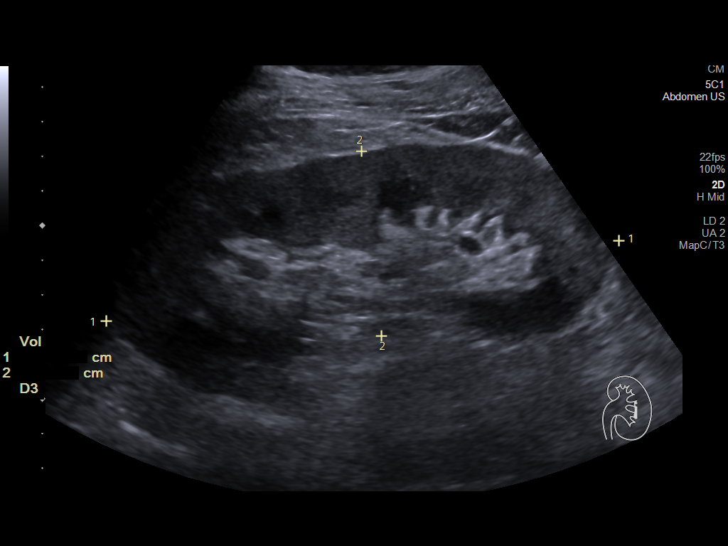
[im 35/56]
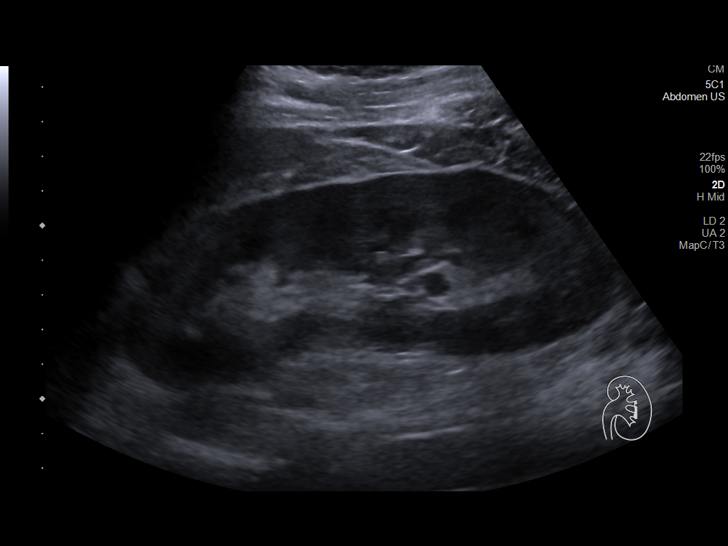
[im 37/56]
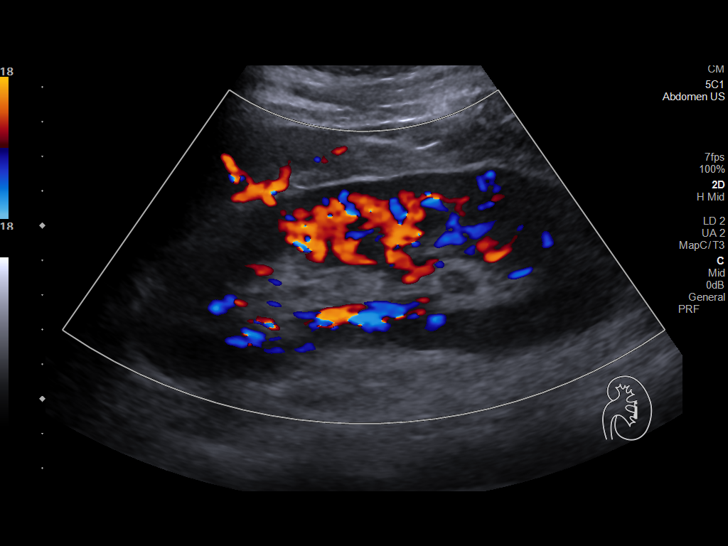
[im 42/56]
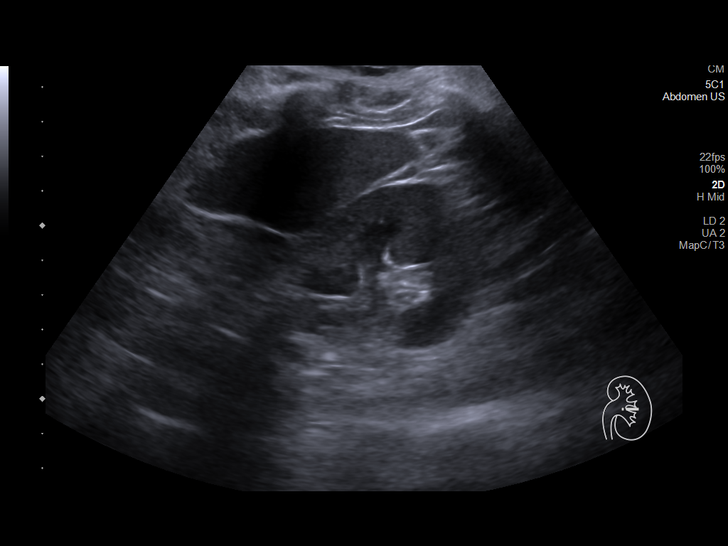
[im 46/56]
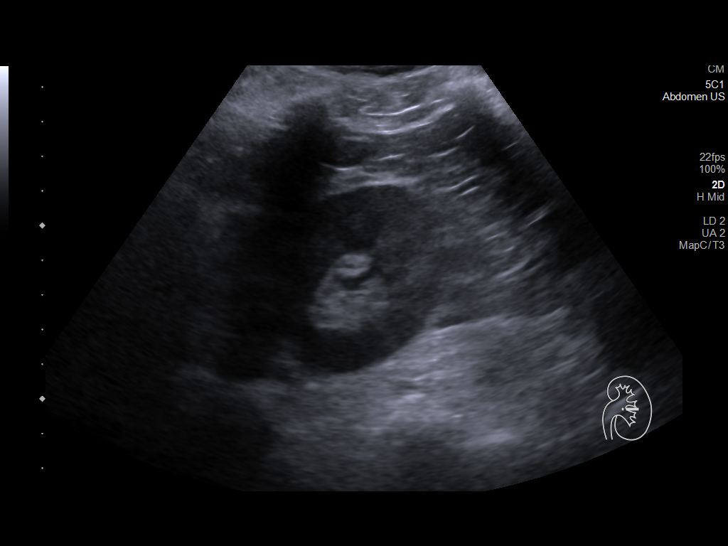
[im 51/56]
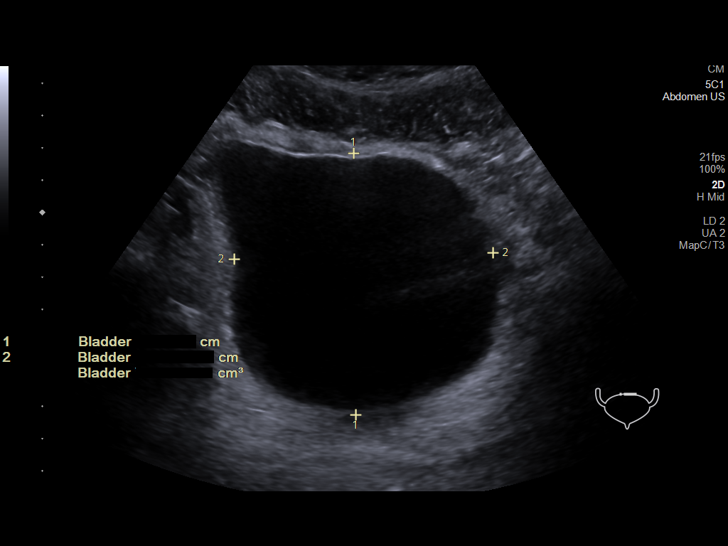
[im 56/56]
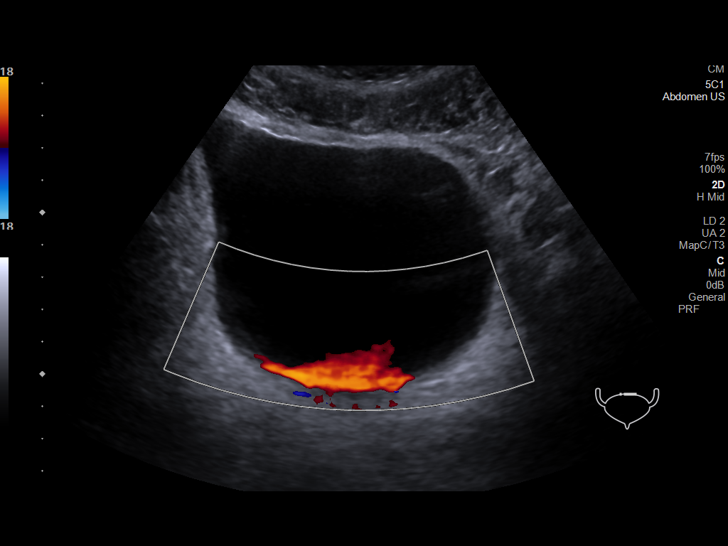

[14 of 25 positions shown; findings below may reference images not displayed]

FINDINGS: Right Kidney:

Renal measurements: 14.3 cm x 4.3 cm x 6.3 cm = volume: 204.8 mL.
Echogenicity within normal limits. No mass is visualized. Mild
pelvic fullness is noted.

Left Kidney:

Renal measurements: 15.0 cm x 5.4 cm x 5.9 cm = volume: 246.4 mL.
Echogenicity within normal limits. No mass or hydronephrosis
visualized.

Bladder:

Appears normal for degree of bladder distention.

Other:

None.
IMPRESSION: 1. Mild fullness of the RIGHT renal pelvis.
2. Otherwise, unremarkable renal ultrasound.

## 2022-10-10 MED ORDER — DEXAMETHASONE 2 MG TABLET
ORAL_TABLET | Freq: Three times a day (TID) | ORAL | 0 refills | 7 days | Status: CP
Start: 2022-10-10 — End: ?
  Filled 2022-10-10: qty 105, 7d supply, fill #0

## 2022-10-10 MED ORDER — PENICILLIN V POTASSIUM 500 MG TABLET
ORAL_TABLET | Freq: Two times a day (BID) | ORAL | 1 refills | 30 days | Status: CP
Start: 2022-10-10 — End: ?
  Filled 2022-10-10: qty 60, 30d supply, fill #0

## 2022-10-10 MED ORDER — POSACONAZOLE 100 MG TABLET,DELAYED RELEASE
ORAL_TABLET | 0 refills | 0 days
Start: 2022-10-10 — End: 2022-10-10

## 2022-10-10 MED FILL — THIAMINE HCL (VITAMIN B1) 100 MG TABLET: ORAL | 100 days supply | Qty: 100 | Fill #0

## 2022-10-12 DIAGNOSIS — C8589 Other specified types of non-Hodgkin lymphoma, extranodal and solid organ sites: Principal | ICD-10-CM

## 2022-10-12 MED ORDER — DRONABINOL 2.5 MG CAPSULE
ORAL_CAPSULE | 0 refills | 0 days
Start: 2022-10-12 — End: ?

## 2022-10-13 ENCOUNTER — Other Ambulatory Visit: Admit: 2022-10-13 | Discharge: 2022-10-14 | Payer: PRIVATE HEALTH INSURANCE

## 2022-10-13 ENCOUNTER — Ambulatory Visit
Admit: 2022-10-13 | Discharge: 2022-10-14 | Payer: PRIVATE HEALTH INSURANCE | Attending: Primary Care | Primary: Primary Care

## 2022-10-13 DIAGNOSIS — C8590 Non-Hodgkin lymphoma, unspecified, unspecified site: Principal | ICD-10-CM

## 2022-10-13 DIAGNOSIS — G92 Immune effector cell-associated neurotoxicity syndrome (ICANS): Principal | ICD-10-CM

## 2022-10-13 DIAGNOSIS — D696 Thrombocytopenia, unspecified: Principal | ICD-10-CM

## 2022-10-13 DIAGNOSIS — Z9285 History of chimeric antigen receptor T-cell therapy: Principal | ICD-10-CM

## 2022-10-13 DIAGNOSIS — D84821 Immunocompromised state due to drug therapy (CMS-HCC): Principal | ICD-10-CM

## 2022-10-13 DIAGNOSIS — C8589 Other specified types of non-Hodgkin lymphoma, extranodal and solid organ sites: Principal | ICD-10-CM

## 2022-10-13 DIAGNOSIS — Z79899 Other long term (current) drug therapy: Principal | ICD-10-CM

## 2022-10-13 MED ORDER — DRONABINOL 2.5 MG CAPSULE
ORAL_CAPSULE | 0 refills | 0 days | Status: CP
Start: 2022-10-13 — End: 2022-10-13

## 2022-10-13 MED ORDER — DRONABINOL 5 MG CAPSULE
ORAL_CAPSULE | Freq: Two times a day (BID) | ORAL | 0 refills | 30 days | Status: CP
Start: 2022-10-13 — End: 2022-11-12
  Filled 2022-10-18: qty 60, 30d supply, fill #0

## 2022-10-14 MED ORDER — THIAMINE HCL (VITAMIN B1) 100 MG TABLET
ORAL_TABLET | Freq: Every day | ORAL | 1 refills | 100 days | Status: CP
Start: 2022-10-14 — End: ?

## 2022-10-17 ENCOUNTER — Ambulatory Visit
Admit: 2022-10-17 | Discharge: 2022-10-22 | Disposition: A | Discharge status: Home / Self Care | Payer: PRIVATE HEALTH INSURANCE | Admitting: Student in an Organized Health Care Education/Training Program

## 2022-10-17 ENCOUNTER — Ambulatory Visit: Admit: 2022-10-17 | Discharge: 2022-10-22 | Payer: PRIVATE HEALTH INSURANCE

## 2022-10-17 DIAGNOSIS — Z9285 History of chimeric antigen receptor T-cell therapy: Principal | ICD-10-CM

## 2022-10-21 MED ORDER — BLOOD SUGAR DIAGNOSTIC STRIPS
0 refills | 0 days | Status: CP
Start: 2022-10-21 — End: ?

## 2022-10-21 MED ORDER — BLOOD-GLUCOSE METER KIT WRAPPER
0 refills | 0 days | Status: CP
Start: 2022-10-21 — End: ?
  Filled 2022-10-22: qty 1, 30d supply, fill #0

## 2022-10-21 MED ORDER — LANCETS
0 refills | 0 days | Status: CP
Start: 2022-10-21 — End: ?
  Filled 2022-10-22: qty 100, 25d supply, fill #0

## 2022-10-21 MED ORDER — DOCUSATE SODIUM 100 MG CAPSULE
ORAL_CAPSULE | Freq: Two times a day (BID) | ORAL | 0 refills | 15 days | PRN
Start: 2022-10-21 — End: ?

## 2022-10-22 DIAGNOSIS — C8589 Other specified types of non-Hodgkin lymphoma, extranodal and solid organ sites: Principal | ICD-10-CM

## 2022-10-22 DIAGNOSIS — Z9285 History of chimeric antigen receptor T-cell therapy: Principal | ICD-10-CM

## 2022-10-22 MED ORDER — INSULIN GLARGINE (U-100) 100 UNIT/ML (3 ML) SUBCUTANEOUS PEN
Freq: Every evening | SUBCUTANEOUS | 0 refills | 100 days | Status: CP
Start: 2022-10-22 — End: ?
  Filled 2022-10-22: qty 15, 30d supply, fill #0

## 2022-10-22 MED ORDER — INSULIN LISPRO (U-100) 100 UNIT/ML SUBCUTANEOUS PEN
2 refills | 0 days | Status: CP
Start: 2022-10-22 — End: ?

## 2022-10-22 MED ORDER — DOCUSATE SODIUM 100 MG CAPSULE
ORAL_CAPSULE | Freq: Two times a day (BID) | ORAL | 0 refills | 15 days | Status: CP | PRN
Start: 2022-10-22 — End: ?
  Filled 2022-10-22: qty 30, 15d supply, fill #0

## 2022-10-22 MED ORDER — AMOXICILLIN 875 MG-POTASSIUM CLAVULANATE 125 MG TABLET
ORAL_TABLET | Freq: Two times a day (BID) | ORAL | 0 refills | 4 days | Status: CP
Start: 2022-10-22 — End: ?
  Filled 2022-10-22: qty 7, 4d supply, fill #0

## 2022-10-22 MED ORDER — DEXAMETHASONE 2 MG TABLET
ORAL_TABLET | Freq: Every day | ORAL | 0 refills | 15 days | Status: CP
Start: 2022-10-22 — End: ?
  Filled 2022-10-22: qty 60, 15d supply, fill #0

## 2022-10-22 MED ORDER — PEN NEEDLE, DIABETIC 32 GAUGE X 1/4" (6 MM)
0 refills | 0 days | Status: CP
Start: 2022-10-22 — End: ?

## 2022-10-22 MED ORDER — LEVOFLOXACIN 500 MG TABLET
ORAL_TABLET | Freq: Every day | ORAL | 0 refills | 30 days | Status: CP
Start: 2022-10-22 — End: ?
  Filled 2022-10-22: qty 30, 30d supply, fill #0

## 2022-10-22 MED FILL — ACCU-CHEK GUIDE TEST STRIPS: 25 days supply | Qty: 100 | Fill #0

## 2022-10-22 MED FILL — UNIFINE PENTIPS 32 GAUGE X 1/4" NEEDLE (6 MM): 25 days supply | Qty: 100 | Fill #0

## 2022-10-22 MED FILL — HUMALOG KWIKPEN (U-100) INSULIN 100 UNIT/ML SUBCUTANEOUS: 30 days supply | Qty: 15 | Fill #0

## 2022-10-23 ENCOUNTER — Ambulatory Visit: Admit: 2022-10-23 | Discharge: 2022-10-24 | Payer: PRIVATE HEALTH INSURANCE

## 2022-10-23 ENCOUNTER — Encounter
Admit: 2022-10-23 | Discharge: 2022-10-24 | Payer: PRIVATE HEALTH INSURANCE | Attending: Adult Health | Primary: Adult Health

## 2022-10-24 ENCOUNTER — Ambulatory Visit: Admit: 2022-10-24 | Discharge: 2022-10-24 | Payer: PRIVATE HEALTH INSURANCE

## 2022-10-24 ENCOUNTER — Ambulatory Visit
Admit: 2022-10-24 | Discharge: 2022-10-24 | Payer: PRIVATE HEALTH INSURANCE | Attending: Student in an Organized Health Care Education/Training Program | Primary: Student in an Organized Health Care Education/Training Program

## 2022-10-24 ENCOUNTER — Ambulatory Visit
Admit: 2022-10-24 | Discharge: 2022-10-24 | Payer: PRIVATE HEALTH INSURANCE | Attending: Pharmacist Clinician (PhC)/ Clinical Pharmacy Specialist | Primary: Pharmacist Clinician (PhC)/ Clinical Pharmacy Specialist

## 2022-10-24 ENCOUNTER — Other Ambulatory Visit: Admit: 2022-10-24 | Discharge: 2022-10-24 | Payer: PRIVATE HEALTH INSURANCE

## 2022-10-24 MED ORDER — PANTOPRAZOLE 40 MG TABLET,DELAYED RELEASE
ORAL_TABLET | Freq: Two times a day (BID) | ORAL | 2 refills | 30 days | Status: CP
Start: 2022-10-24 — End: 2022-11-23

## 2022-10-27 ENCOUNTER — Ambulatory Visit
Admit: 2022-10-27 | Discharge: 2022-10-27 | Payer: PRIVATE HEALTH INSURANCE | Attending: Primary Care | Primary: Primary Care

## 2022-10-27 ENCOUNTER — Other Ambulatory Visit: Admit: 2022-10-27 | Discharge: 2022-10-27 | Payer: PRIVATE HEALTH INSURANCE

## 2022-10-27 DIAGNOSIS — C8589 Other specified types of non-Hodgkin lymphoma, extranodal and solid organ sites: Principal | ICD-10-CM

## 2022-10-29 ENCOUNTER — Encounter: Admit: 2022-10-29 | Payer: PRIVATE HEALTH INSURANCE

## 2022-10-29 ENCOUNTER — Inpatient Hospital Stay: Admit: 2022-10-23 | Payer: PRIVATE HEALTH INSURANCE

## 2022-10-29 ENCOUNTER — Encounter: Admit: 2022-10-29 | Discharge: 2022-11-27 | Payer: PRIVATE HEALTH INSURANCE

## 2022-10-30 ENCOUNTER — Ambulatory Visit
Admit: 2022-10-30 | Discharge: 2022-10-31 | Payer: PRIVATE HEALTH INSURANCE | Attending: Primary Care | Primary: Primary Care

## 2022-10-30 ENCOUNTER — Ambulatory Visit: Admit: 2022-10-30 | Discharge: 2022-10-31 | Payer: PRIVATE HEALTH INSURANCE

## 2022-10-30 ENCOUNTER — Other Ambulatory Visit: Admit: 2022-10-30 | Discharge: 2022-10-31 | Payer: PRIVATE HEALTH INSURANCE

## 2022-10-30 DIAGNOSIS — C8589 Other specified types of non-Hodgkin lymphoma, extranodal and solid organ sites: Principal | ICD-10-CM

## 2022-10-30 DIAGNOSIS — C8330 Diffuse large B-cell lymphoma, unspecified site: Principal | ICD-10-CM

## 2022-10-30 MED ORDER — INSULIN GLARGINE (U-100) 100 UNIT/ML (3 ML) SUBCUTANEOUS PEN
Freq: Every evening | SUBCUTANEOUS | 0 refills | 125 days | Status: CP
Start: 2022-10-30 — End: ?
  Filled 2022-11-17: qty 15, 30d supply, fill #1

## 2022-10-31 ENCOUNTER — Ambulatory Visit: Admit: 2022-10-31 | Discharge: 2022-11-06 | Payer: PRIVATE HEALTH INSURANCE

## 2022-10-31 ENCOUNTER — Ambulatory Visit
Admit: 2022-10-31 | Discharge: 2022-11-06 | Disposition: A | Discharge status: Home / Self Care | Payer: PRIVATE HEALTH INSURANCE

## 2022-10-31 ENCOUNTER — Encounter: Admit: 2022-10-31 | Discharge: 2022-11-06 | Payer: PRIVATE HEALTH INSURANCE

## 2022-11-06 MED ORDER — DEXAMETHASONE 4 MG TABLET
Freq: Every day | ORAL | 0 refills | 0 days
Start: 2022-11-06 — End: ?

## 2022-11-06 MED ORDER — NIRMATRELVIR 300 MG (150 MG X2)-RITONAVIR 100 MG TABLET,DOSE PACK
ORAL_TABLET | 0 refills | 0 days | Status: CP
Start: 2022-11-06 — End: ?
  Filled 2022-11-07: qty 30, 5d supply, fill #0

## 2022-11-06 MED ORDER — DEXAMETHASONE 2 MG TABLET
ORAL_TABLET | Freq: Every day | ORAL | 0 refills | 30 days | Status: CP
Start: 2022-11-06 — End: ?
  Filled 2022-11-07: qty 60, 30d supply, fill #0

## 2022-11-07 ENCOUNTER — Encounter: Admit: 2022-11-07 | Discharge: 2022-11-07 | Payer: PRIVATE HEALTH INSURANCE

## 2022-11-07 ENCOUNTER — Ambulatory Visit: Admit: 2022-11-07 | Discharge: 2022-11-07 | Payer: PRIVATE HEALTH INSURANCE

## 2022-11-07 ENCOUNTER — Other Ambulatory Visit: Admit: 2022-11-07 | Discharge: 2022-11-07 | Payer: PRIVATE HEALTH INSURANCE

## 2022-11-07 DIAGNOSIS — R479 Unspecified speech disturbances: Principal | ICD-10-CM

## 2022-11-07 DIAGNOSIS — F419 Anxiety disorder, unspecified: Principal | ICD-10-CM

## 2022-11-07 DIAGNOSIS — T8082XD Complication of immune effector cellular therapy, subsequent encounter: Principal | ICD-10-CM

## 2022-11-07 DIAGNOSIS — J449 Chronic obstructive pulmonary disease, unspecified: Principal | ICD-10-CM

## 2022-11-07 DIAGNOSIS — Z9181 History of falling: Principal | ICD-10-CM

## 2022-11-07 DIAGNOSIS — N4289 Other specified disorders of prostate: Principal | ICD-10-CM

## 2022-11-07 DIAGNOSIS — Z923 Personal history of irradiation: Principal | ICD-10-CM

## 2022-11-07 DIAGNOSIS — D61811 Other drug-induced pancytopenia: Principal | ICD-10-CM

## 2022-11-07 DIAGNOSIS — Z6820 Body mass index (BMI) 20.0-20.9, adult: Principal | ICD-10-CM

## 2022-11-07 DIAGNOSIS — R410 Disorientation, unspecified: Principal | ICD-10-CM

## 2022-11-07 DIAGNOSIS — F32A Depression, unspecified: Principal | ICD-10-CM

## 2022-11-07 DIAGNOSIS — Z7952 Long term (current) use of systemic steroids: Principal | ICD-10-CM

## 2022-11-07 DIAGNOSIS — C8339 Diffuse large B-cell lymphoma, extranodal and solid organ sites: Principal | ICD-10-CM

## 2022-11-07 DIAGNOSIS — T380X5A Adverse effect of glucocorticoids and synthetic analogues, initial encounter: Principal | ICD-10-CM

## 2022-11-07 DIAGNOSIS — R634 Abnormal weight loss: Principal | ICD-10-CM

## 2022-11-07 DIAGNOSIS — E0965 Drug or chemical induced diabetes mellitus with hyperglycemia: Principal | ICD-10-CM

## 2022-11-07 DIAGNOSIS — R413 Other amnesia: Principal | ICD-10-CM

## 2022-11-07 DIAGNOSIS — C8589 Other specified types of non-Hodgkin lymphoma, extranodal and solid organ sites: Principal | ICD-10-CM

## 2022-11-07 DIAGNOSIS — Z794 Long term (current) use of insulin: Principal | ICD-10-CM

## 2022-11-07 DIAGNOSIS — R918 Other nonspecific abnormal finding of lung field: Principal | ICD-10-CM

## 2022-11-07 DIAGNOSIS — R41842 Visuospatial deficit: Principal | ICD-10-CM

## 2022-11-07 DIAGNOSIS — Z7984 Long term (current) use of oral hypoglycemic drugs: Principal | ICD-10-CM

## 2022-11-07 DIAGNOSIS — U071 COVID-19: Principal | ICD-10-CM

## 2022-11-07 MED ORDER — LEVOFLOXACIN 500 MG TABLET
ORAL_TABLET | Freq: Every day | ORAL | 0 refills | 30 days | Status: CN
Start: 2022-11-07 — End: ?

## 2022-11-07 NOTE — Unmapped (Addendum)
Cellular Therapy Research Note  Date: 11/07/2022    Clinical Study: [facility] 1813-ATL: A Phase I Study of Autologous Activated T-Cells Targeting the CD19 Antigen and Containing the Inducible Caspase 9 Safety Switch in Subjects with Relapsed/Refractory B-cell Lymphoma and Chronic Lymphocytic Leukemia/ Small Lymphocytic Lymphoma, IRB# [id number]    Protocol Day: Week 6, Day 1    Performance Status: kps/ecog: KPS = 70 - Cares for self; unable to carry on normal activity or to do active work. / ECOG equivalent 1     Important dates:   - Collection: 08/15/22   - LD chemo: 12/15- 09/21/22   - iC9-CAR19 cell infusion- 09/26/22   - End of DLT period- 10/24/22      Research Assessment & Plan:  Andre Duncan is a 57 y.o. male with a previous medical history significant for bilateral inguinal hernias, GERD, prostate atrophy, diabetes mellitus, COPD, anxiety and depression.     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 He received 4 cycles of the MATRIX regimen (methotrexate, cytarabine, thiotepa and Rituximab) from 9/2 to 08/10/2020 and achieved a partial remission, then continued on with 3 additional cycles of single agent methotrexate. He later received 2340 cGy of whole brain radiation (180 cGy x 13 fractions) and subsequently achieved a CR. He was not a candidate to proceed with autologous stem cell transplant at that time, due to poor PFTs and active smoking. Unfortunately, a 08/15/21 brain MRI showed progression and he had seizure-like activity on 09/05/21, so was started on Keppra at that time. He received 5 fractions of Cyberknife radiation to involved areas of his brain, completing 09/06/21, and was then started on Ibrutinib at that time. He continued to get surveillance brain MRIs (2/1, 4/6, 6/7, 9/14, and 07/10/22); unfortunately, his 07/10/22 brain MRI did exhibit new right parietal lobe lesions, consistent with disease progression.    He discontinued his ibrutinib and started on lenalidomide (20 mg x 21 days, 7 days off) on 07/26/22. He received his first and second doses of Rituximab on 08/01/22 and 08/25/22, respectively. He just finished his 2nd cycle of Lenalidomide dosing on 09/12/22.    He was consented using an Arabic interpreter on 08/15/22, and his lymphocytes were collected via whole blood on 08/15/2022. Manufacturing of iC9-CD19 CAR-T cells was successful and he was found eligible to proceed (with HAMA testing pending at time of treatment initiation). He received lymphodepleting chemotherapy with bendamustine and fludarabine from 12/15 through 09/21/22.    Unfortunately, patient tested positive for COVID-19 on 09/24/22, after presentation to ED for an incarcerated left inguinal hernia spurred on by a worsening cough that started 09/24/22. He was started on Paxlovid immediately and the decision was made to proceed with CAR-T cell infusion on 09/26/22 despite the COVID-19 infection because his symptoms were improving on antiviral therapy and the risk-benefit analysis favored proceeding given he had already been lymphodepleted.  He received his iC9-CAR19 cell infusion on 09/26/2022 at a dose of 2 x 10^6 cells/kg. He was admitted overnight for observation, given his active COVID-19 infection (and history of smoking and COPD).    Post-CAR-T infusion, his course has been complicated by G1 CRS (10/04/22), G1 ICANS, and lung infection resulting in admissions between 1/12- 1/17 and 1/26- 11/06/22, likely related to COVID-19 infection starting 09/24/22.      Assessment: Patient returns today for his Week 6, Day 1 visit today after being discharged yesterday following a 6-day  admission for neutropenic fever following presentation for altered mental status, falls and fevers on 10/31/22.    He's seen in clinic today by Dr. Renae Fickle, who performed a review of systems and physical examination. I met with the patient and his wife during and after his evaluation with Dr. Elmon Kirschner, to discuss his interval history since the time of my last note (1/19)    Over the past 2 weeks, the patient experienced the following:    - Inguinal hernia: Incarcerated left inguinal hernia was able to be reduced in ED on 09/24/22   - He reports pain at site of left inguinal hernia this morning; as opposed to incarcerated hernia in December, he reports only mild pain (2 or 3 out of 10 on pain scale); Dr. Elmon Kirschner palpated during exam; was advised to ice over the weekend and to try to reduce on his own or to call if pain was worsening    - Febrile neutropenia- He presented to [facility] ED on 10/31/22, reporting home fevers to 103F, in addition to confusion and a fall in the early morning hours. Vitals upon arrival were: BP= 100/61, HR=80, resp rate= 17, SpO2=97%. X-rays of bilateral ankles and knees performed and no acute fracture or misalignment was identified. Chest x-ray showed bibasilar consolidation with developing pneumonia.; bilateral lower lobe infiltrates worse than 1/12- 1/17 admission. Subsequent head CT showed no acute intracranial abnormality. 11/02/22 chest CT showed consolidation in bilateral lower lobes; in context of low cycle threshold with SAR-CoV-2 PCR, infection concerning for COVID-19 with superimposed bacterial pneumonia. However, blood cultures drawn 10/31/22 were negative for growth at 24 hours; B-pertussis PCR negative; MRSA not detected, and besides persistent positivity with SAR-CoV-2 testing, patient remained negative for all other pathogens on respiratory pathogen panel.     He was given IV fluids upon arrival and started on cefepime (2 g IV q 8 hours) and vancomycin upon arrival, and was initiated on ral azithromycin (500 mg daily) on 1/27 through 11/03/22. Hhe completed a 7-day course of cefepime (ending 11/06/22). He was given a loading dose (200 mg) of remdesivir on 1/27 and continued with IV remdesivir (100 mg IV daily) for treatment of persistent COVID-19 infection through 11/06/22 . He was receiving oxygen supplementation at 3L via nasal cannula but was quickly weaned to room air. His strength recovered with IV antibiotic and antiviral treatment and he was discharged in stable condition on 11/06/22; he was discharged with a 5-day course of Paxlovid and at least an additional 7 days of levofloxacin.     - Lung infection/ pneumonia AND COVID-19 infection- Admitted 1/12- 10/22/22 after presenting with progressively worsening fatigue, weakness, and shortness of breath (starting 1/12); given cefe/ vanc, then switched to zosyn until discharge; given Augmentin at discharge   - Admitted again 1/26- 11/06/22 (see above) for continued pneumonia; all other viral pathogens negative except for persistently positive SARS-CoV-2 PCR testing (low cycle threshold indicating COVID-19 infection still likely contributor to ongoing pneumonia); completed 7-day course of remdesivir, as well as 8-day course of IV cefepime, 3 day course of oral azithromycin; discharged with paxlovid and levofloxacin   - See notes above regarding ongoing lung infection/ pneumonia     - Pancytopenia- Most likely related to CAR-T therapy, but possibly related to lung infection. He continues to get prophylactic fluconazole and valacyclovir, and to continue levofloxacin for antibiotic prophylaxis.    -   - Transfusion support: Received 1 U platelets on 10/18/22 (plat= 8) and 10/23/22 (plat= 16); platelet transfusions again  on 1/25 and 11/04/22; platelets still very low but stable over the last week  - G-CSF support for prolonged neutropenia- as patient is nearly 30 days out (Day +28) from CAR-T, he received filgrastim- aafi (Nivestym) on 1/19; also received Udenyca (6 mg sub-Q) on 10/30/22; ANC now recovered (11/07/22)  - Prophylaxis- Continues on valacyclovir and levofloxacin    - Fatigue/ deconditioning/ generalized muscle weakness - Patient reports some ongoing fatigue since the time of his COVID-19 diagnosis (updated AE table below). He noted markedly worsening fatigue and weakness in the days leading up to his admission on 1/12. Energy level improved during admission. Worked with PT/OT on several occasions during hospital stay; to follow with home PT.   - He continues to report ongoing fatigue at time of Week 6 visit; it is likely that his fatigue and weakness are at least possibly related to deconditioning resulting from underlying infection and repeated inpatient hospitalizations; he reports that his energy level is overall stable in recent weeks    - Constipation- Wife previously reported giving him some tea containing Senna.    - He continues to report intermittent constipation. It again has been several days since his last bowel movement. His wife reports Colace use twice daily, as well as once daily peri-colace.     - Cytokine release syndrome- Admitted 12/30 after reporting fevers to 100.378F but remained afebrile throughout admission; given IV cefepime from 12/31- 10/06/22 then discontinued; discharged with penicillin and fluconazole as prophylaxis; no further CRS symptoms other than home temp to 100.378F (on 12/30)    - ICANS- Noted to have progressive muscle weakness, gait/coordination issues, left-sided weakness starting 10/07/22; ICE score decreased to 7/10 on 1/3 and patient started on IV dexamethasone (10 mg q 6 hours); continuous EEG with signs of mild encephalopathy; repeat brain MRI showed: increased size of largest enhancing lesions in the right frontal and right parietal lobes with more prominent associated intralesional hemorrhage of the right frontal lobe lesion and increased right cerebral hemisphere vasogenic edema. Numerous small foci of enhancement are less prominent than prior, for example in the left centrum semiovale   - ICE score returned to 10/10 on 10/10/22 with improvement in strength (able to sit up) and coordination issues; discharged 10/10/22 on PO dexamethasone (10 mg q 8 hours PO)   - ICE score remained 10/10 throughout admission from 1/12- 10/22/22   - Dexamethasone taper:  - 10 mg BID on 1/8  - 10 mg daily on 1/10  - 8 mg daily on 1/18  - 6 mg daily on 1/23  - 4 mg daily on 1/29; will continue     - COVID-19 infection: Coughing started 09/24/22; violent coughing led to hernia (see below); tested positive for SARS-CoV-2 on 09/24/22 and started on Paxlovid  - Treatment to date:  - Paxlovid (1)- 12/20- 10/03/22  - Remdesivir (1)- 1/12 through 10/22/22   - Remdesivir (2)- 1/27 through 11/06/22  - Paxlovid (2)- 11/06/22   - Given continued low cycle threshhold on SARS-CoV-2 PCR, patient's underlying COVID-19 infection suspected to continue to contribute to pneumonia    - Confusion, agitation and memory impairment: Following relapse of his PCNSL in November, 2022, patient presented to the ED after a syncopal episode and his wife noticed worsening confusion following the incident. He fell on 09/20/21 and lost consciousness again.   - He's seeing Dr. Sabino Niemann in Neurology and was last seen 07/18/22.  - He received whole brain radiation in Apr, 2022; was started on memantine for  neurocognitive protection, which both he and his wife feel has been helpful    - Since 2022, he's exhibited several cognitive deficits, such as word finding difficulty, memory impairment, and spatial-visual deficits    - Patient's wife reports more recent (~Sept, 2023) episodes of confusion and irritability    - His wife has previously reported episodes of irritability/ agitation following whole brain radiation in 2022, but again noted worsening irritability as late as Sept, 2023   - At visit today (11/9), Dr. Elmon Kirschner and I evaluated patient's mental status and decision-making capacity; at present there is no concern for patient's capacity to make medical decisions for himself (including consent to a clinical trial); an MMSE was performed and was considered normal (score of 26/30); we do not believe that his baseline cognition will make it difficult to assess cell therapy mediated neurotoxicity   - Since starting lenalidomide and Rituximab, his wife continues to report improvement in confusion and fewer episodes of agitation/ irritability    - ICE score 9/10 on 10/07/22 (inability to write sentence)   - ICE score worsened to 7/10 on 10/08/22 (inability to write sentence, count backward from 100, orient to time), but improved to 9/10 on 10/09/22   - ICE score remained 9/10 or 10/10 throughout admission between 1/12 and 1/17   - ICE score 10/10 at time of Week 6 visit on 11/07/22    - He continues to report some ongoing nausea and continues to use ondansetron as needed. He continues to report some mild dysgeusia as well. Both nausea and dysgeusia, continue to contribute to ongoing Grade 2 weight loss.       - D-dimer elevated to 2099 ng/mL on 10/05/22; continues to be elevated; last check on 11/03/22= 852      The patient acknowledges the following ongoing medical history/ issues (changes are underlined):    - Hyperlipidemia- Patient met with his PCP on 09/15/22, who repeated his lipid panel. Patient reportedly had stopped taking his prescribed statin due to leg cramps. His PCP Ali Lowe) gave a prescription for ezetimibe (Zetia).  - Prostate enlargement: Patient's wife reports he's been taking finasteride since 2015. They report he was diagnosed with bilateral inguinal hernias which resolved after he wore a support belt for a period of a month.  - COPD: The patient and his wife report that he was diagnosed in 2022, but there was documentation of COPD at the time of original diagnosis in Aug, 2021. He is a long time smoker.  PFT's in March, 2022, showed reduced DLCO.  - Diabetes mellitus: Diagnosed in 2022; Blood glucose levels increased with continuing steroid use; glucose levels as high as 530 (by POC on 1/15); significant insulin requirements while inpatient and discharged on 15U lantus nightly with instructions to adjust as needed;    - Glucose levels remain elevated, but overall stable on current regimen:    - Metformin (Glucophage)- 1 tab (500 mg) BID; PO    - Humalog 100 U/inj pen- Injected under the skin 4x/daily (before meals and nightly)    - Insulin glargine- 0.12 mL (100 U/mL) under the skin nightly.    - Anorexia: Patient reports a reduced appetite since the time of his diagnosis. He reported a 45-pound weight loss following initial diagnosis and treatment on the MATRIX protocol. He's been using marinol with good effect and has maintained a somewhat steady weight over the last 6 months (3 kg weight loss).   - Worsened prior to admission on 1/12 but improved  upon admission   - Weight loss- Baseline weight on 09/17/22 was 82.1 kg; weight on 10/21/22 was 68.2 kg, a 16.9% decrease    - Had several nutrition consults during admission    - Weight today, 11/07/22= 70.1 kg (14.6% decrease since baseline)      - GERD: Patient and his wife note acid reflux starting during MATRIX treatment (Sept, 2021). He reports these symptoms are well controlled on pantoprazole.   - Reported (at 1/8 visit)  increased issues with heartburn since initiating dexamethasone dosing on 1/3; discussed protonix dosing, which will be maintained; can continue use of Mylanta as needed    - Anxiety and depression: Patient is taking escitalopram for anxiety and olanzapine and mirtazapine for depression.    - Cytarabine intolerance/ poisoning: Of note, patient has reported pulmonary and neurotoxicity associated with high-dose cytarabine (given with MATRIX regimen).      Study notes:   - ICE score performed at Week 6 visit on 2/2: 10/10   - PRO Questionnaires (optional):   - Given pre-treatment questionnaires on 09/17/22; patient completed at home and is yet to return  - Refused completing questionnaires at Week 0, Day 1 visit on 09/26/22  - Week 1, Day 1 visit missed on 10/02/22  - Asked patient and his wife to try to locate and return pre-LD questionnaires given and completed on 09/17/22  - Week 6, Day 1 visit- Completed prior to clinic visit with Dr. Elmon Kirschner on 11/07/22  - Next: @ Month 6 visit  - Correlative biospecimens   - Week 0, Day 1 (pre- and post-dose)- 09/26/22   - Week 1, Day 1 visit (drawn late due to patient missing 10/02/22 visit)- 10/07/22  - Week 2, Day 1 visit- Collected as inpatient on 10/09/22  - Week 3 biospecimens not collected; patient admitted on 10/17/22 (deviation)- tubes drawn at midnight on 10/17/22 but were not processed and were beyond stability   - Week 4, Day 1- 10/24/22  - Week 6, Day 1- collected today, 11/07/22   - Next: @ Month 3 visit on 12/19/22  - Imaging (Brain MRI)   - Pre-procurement: 07/10/22   - Pre-LD brain MRI: 09/16/22   - Unscheduled: 10/07/22   - Week 6 brain MRI w/ and w/o contrast on 11/07/22= Overall stable disease: Since 10/07/2022, slight decrease in size of a posterior right frontal periventricular lesion. No significant change in size of a right parietal parafalcine enhancing lesion. Both lesions showed decrease of adjacent vasogenic edema since 10/07/2022.      - Next: @ Month 6 visit (required by protocol), but likely prior to that, as clinically indicated      Plan: Patient has now complete short-term follow up visits following his 09/26/22 CAR-T infusion. Starting at his Month 3 visit (planned for 12/19/22), he will follow at q 3 month intervals until Month 12 and then annually until 5 years from CAR-T infusion. Following Year 5, he'll be followed for survival and CAR-T mediated long term effects annually until 15 years past CAR-T infusion.    He will continue to taper his dexamethasone (4 mg daily currently) and will follow with Dr. Elmon Kirschner in Hem Onc at regular intervals. Until recovery of his platelets, he'll have twice weekly labs to assess need for transfusion support. He will be established with home health to assist with ongoing needs (twice weekly labs, physical therapy to address issues with strength, etc).        Prior Cancer Treatment History   Initial diagnosis:  Primary CNS lymphoma (Large B-cell lymphoma) + date 06/03/2020      Prior Surgeries:    - Right posterior parietal brain biopsy- 06/03/2020    Prior treatments:  Regimen Date Started Date Ended # Cycles Best Response + Date Date of PD   1A) MATRIX (methotrexate, cytarabine, thiotepa, and Rituximab) 06/07/2020 08/10/20 4 PR N/A   1B) Methotrexate (single agent) 10/09/20 11/30/20 3 PR (near CR) 08/15/21   2B) Ibrutinib (560 mg daily) 09/06/2021 07/23/22  CR 07/10/22   3) Lenalidomide (20 mg x 21 days, 7 days off) + Rituximab 07/26/22 (Len); 08/01/22 (Ritux) Last lenalidomide: 09/12/22; Last Rituximab: 08/25/22 2 PD 09/16/22             Prior Radiation:  Type of Radiation Anatomical location Start Date End Date Dose # of Fractions   1C) Whole brain radiation Brain 01/17/21 02/04/21 2340 cGy (total) 13 fractions (180 cGy each)   2A) Cyberknife Brain 09/02/21 09/06/21 2500 cGy (total) 5 fractions (500 cGy each)                              Pertinent Medical History:  Medical History Grade Date Started Date Ended Medication   Hyperlipidemia 2 UNK Ongoing Ezetimibe (Zetia)   Bilateral inguinal hernias UNK ~2015 ~2015 None   Prostate enlargement UNK ~2015 Ongoing Finasteride   Respiratory, thoracic and mediastinal disorders- other: COPD 2 ~Aug, 2021 Ongoing Albuterol, glycopyrrolate, ipratropium-albuterol inhalers   Memory impairment 2 ~summer, 2021 Ongoing Memantine   Confusion 1 ~summer, 2021 June, 2022 (worsened) Memantine   Anorexia 2 ~Aug, 2021 Ongoing Dronabinol (Marinol)   GERD 2 ~Sept, 2021 Ongoing Pantoprazole   Anxiety 2 UNK Ongoing Escitalopram oxalate   Depression 1 UNK Ongoing olanzapine, mirtazapine   Metabolism and nutrition disorders- other: Diabetes mellitus 2 ~ 2022 Ongoing Metformin   Confusion 2 ~June, 2022 Ongoing Memantine   Agitation 2 ~July, 2022 Ongoing None   Rash, NOS 2 ~07/27/2022 ~09/12/22 Methylprednisolone (finished)       Adverse Event Log    - Note: Protocol requirements for AE reporting only require reporting of AE's through the end of the DLT period (10/24/22). The patient's febrile neutropenia is the only AE between 1/19 and today (2/2) that is being recorded in the table below.  AE Date Started Date Ended Grade Attribution to procurement Attribution to lymphodepletion Attribution to CAR-T infusion Clinically Significant?  (Y / N)   Cholesterol high 09/08/22 Ongoing 1 Unrelated N/A N/A N   Constipation 09/20/22 09/24/22 2 Unrelated Possibly N/A N   Dysgeusia ~09/22/22 Ongoing 1 Unrelated Likely N/A N   Gastrointestinal disorders-other: inguinal hernia 09/24/22 09/24/22 2 Unrelated Unlikely N/A N   Infection- other: COVID-19 09/24/22 ~10/03/22 (date of paxlovid completion) 2 Unrelated Possibly N/A N   Nausea 09/24/22 ~10/10/22 1 Unrelated Likely N/A N   Fatigue ~09/25/22 ~10/15/22 2 Unrelated Possibly N/A N   Vomiting 09/24/22 09/25/22 1 Unrelated Possibly N/A N   Generalized muscle weakness 09/24/22 09/25/22 2 Unrelated Possibly N/A N   Fall 09/24/22 09/24/22 1 Unrelated Possibly N/A N   WBC count decreased 09/24/22 09/27/22 2 Unrelated Definitely N/A N   Lymphocyte count decreased 09/24/22 09/27/22 4 Unrelated Definitely N/A N   Neutrophil count decreased 09/26/22 09/27/22 1 Unrelated Definitely N/A N   WBC count decreased 09/27/22 09/30/22 3 Unrelated Definitely Unlikely N   Neutrophil count decreased 09/27/22 09/30/22 3 Unrelated Definitely Unlikely N   Lymphocyte count decreased 09/27/22  09/30/22 3 Unrelated Definitely Unlikely N   Hypoalbuminemia 09/27/22 09/30/22 1 Unrelated possibly Unlikely N   WBC count decreased 09/30/22 10/04/22 1 Unrelated Definitely Unlikely N   Neutrophil count decreased 09/30/22 10/04/22 1 Unrelated Definitely Unlikely N   Lymphocyte count decreased 09/30/22 10/05/22 1 Unrelated Definitely Unlikely N   Cytokine release syndrome (CRS) [SAE] 10/04/22 10/04/22 1 Unrelated Unrelated Probably Y   Proteinuria 10/04/22 10/18/22 2 Unrelated possibly Unlikely N   Hyponatremia 10/04/22 10/07/22 1 Unrelated possibly Unlikely N   WBC count decreased 10/05/22 10/08/22 1 Unrelated Definitely Unlikely N   Lymphocyte count decreased 10/05/22 10/06/22 2 Unrelated Definitely Unlikely N   Disseminated intravascular coagulation: D-dimer elevation 10/05/22 Ongoing 2 Unrelated Unlikely Unlikely N   Lymphocyte count decreased 10/06/22 10/08/22 1 Unrelated Definitely Unlikely N   Platelet count decreased 10/07/22 10/10/22 1 Unrelated Probably Unlikely N   Generalized muscle weakness ~10/07/22 10/09/22 3 Unrelated Possibly Possibly N   ICANS [SAE] 10/07/22 10/09/22 1 Unrelated Unrelated Possibly Y   Left-sided muscle weakness 10/08/22 10/09/22 2 Unrelated Unrelated Possibly N   WBC count decreased 10/08/22 10/10/22 2 Unrelated Definitely Possibly N   Neutrophil count decreased 10/08/22 10/09/22 1 Unrelated Definitely Possibly N   Lymphocyte count decreased 10/08/22 10/09/22 2 Unrelated Definitely Possibly N   Generalized muscle weakness/ deconditioning 10/09/22 ~10/16/22 2 Unrelated Possibly Possibly N   Anemia 10/09/22 10/13/22 1 Unrelated Definitely Possibly N   Lymphocyte count decreased 10/09/22 10/17/22 3 Unrelated Definitely Possibly N   Blood bicarbonate decreased 10/09/22 10/10/22 1 Unrelated Unlikely Unlikely N   Hyperglycemia 10/09/22 10/10/22 1 Unrelated Unrelated Unrelated N   Hypoalbuminemia 10/09/22 10/17/22 1 Unrelated possibly unlikely N   Constipation 10/09/22 Ongoing 2 Unrelated unrelated unrelated N   Platelet count decreased 10/10/22 10/13/22 2 Unrelated Definitely Possibly N   Nausea ~10/10/22 Ongoing 2 Unrelated Unlikely Unlikely N   WBC count decreased 10/13/22 10/17/22 2 Unrelated Definitely Possibly N   Platelet count decreased 10/13/22 10/17/22 3 Unrelated Definitely Possibly N   Hyperglycemia 10/13/22 10/19/22 1 Unrelated Unrelated Unrelated N   AST increased 10/13/22 10/17/22 1 Unrelated Unlikely Unlikely N   ALT increased 10/13/22 10/17/22 2 Unrelated Unlikely Unlikely N   Fatigue ~10/15/22 ~10/21/22 3 Unrelated Unlikely Unlikely N   Generalized muscle weakness ~10/16/22 ~10/19/22 3 Unrelated Unlikely Unlikely N   Lung infection [SAE] 10/17/22 10/22/22 3 Unrelated Possibly Probably Y   Hypertension 10/17/22 10/17/22 2 Unrelated Unrelated Unrelated N   Tachycardia 10/17/22 10/17/22 1 Unrelated Unrelated Unrelated N   Weight loss 10/17/22 Ongoing 2 Unrelated Unlikely Unlikely N   WBC count decreased 10/17/22 10/17/22 3 Unrelated Possibly  Probably N   Platelet count decreased 10/17/22 10/19/22 4 Unrelated Possibly Probably N   Neutrophil count decreased 10/17/22 10/17/22 2 Unrelated Possibly Probably N   Lymphocyte count decreased 10/17/22 10/23/22 4 Unrelated Possibly Probably N   Hyponatremia 10/17/22 10/17/22 1 Unrelated Unlikely Unlikely N   ALT increased 10/17/22 10/19/22 1 Unrelated Unlikely Unlikely N   WBC count decreased 10/17/22 11/05/22 4 Unrelated Possibly Probably N   Neutrophil count decreased 10/17/22 10/18/22 3 Unrelated Possibly Probably N   Hyponatremia 10/17/22 10/20/22 1 Unrelated Unlikely Unlikely N   Hypoalbuminemia 10/17/22 10/19/22 1 Unrelated Possibly Unlikely N   Neutrophil count decreased 10/18/22 11/04/22 4 Unrelated Possibly Probably N   Hiccups 10/18/22 10/19/22 2 Unrelated Unrelated Unrelated N   Generalized muscle weakness ~10/19/22 Ongoing (at time of Week 6 visit) 2 Unrelated Unlikely Unlikely N   Hyperglycemia 10/19/22 Ongoing (intermittent) 2  Unrelated Unrelated Unrelated (related to steroid dosing) N   Platelet count decreased 10/19/22 10/20/22 3 Unrelated Possibly Probably N   Anemia 10/19/22 10/23/22 1 Unrelated Unlikely Unlikely N   Hypoalbuminemia 10/19/22 10/24/22 2 Unrelated Possibly Unlikely N   Platelet count decreased 10/20/22 10/23/22 4 Unrelated Possibly Probably N   Fatigue ~10/21/22 Ongoing 2 Unrelated Unlikely Unlikely N   Lung infection (pneumonia) 10/22/22 (date of discharge) Ongoing 2 Unrelated Possibly Probably N   Platelet count decreased 10/23/22 10/27/22 3 Unrelated Possibly Probably N   Lymphocyte count decreased 10/23/22 Ongoing (at time of Week 6 visit) 3 Unrelated Possibly Probably N   Lactate dehydrogenase increased 10/23/22 Ongoing 1 Unrelated Unlikely Unlikely N   Platelet count decreased 10/30/22 Ongoing (at time of Week 6 visit) 4 Unrelated Possibly Probably N   Febrile neutropenia [SAE follow up] 10/31/22 10/31/22 3 Unrelated Possibly Probably Y   Hypoalbuminemia 10/31/22 Ongoing (at time of Week 6 visit) 2 Unrelated Unlikely Possibly N   Anemia 11/06/22 Ongoing (at time of Week 6 visit) 2 Unrelated Possibly Probably N   Hyperglycemia 11/07/22 Ongoing (at time of Week 6 visit) 2 Unrelated Unlikely Unlikely N   WBC count decreased 11/07/22 Ongoing (at time of Week 6 visit) 2 Unrelated Possibly Probably N             All lab values and assessments were reviewed by the investigator and are considered not clinically significant unless otherwise noted.    CRS / ICANS Log  Toxicity / Grade / Dates Symptoms related to CRS/ICANS  (Not listed in AE log) Intervention  (Toci, Steroids,   Rimiducid, etc)   Cytokine release syndrome- Grade 1- Start: 10/04/22; Stop: 10/04/22 Fever No intervention required   ICANS- Grade 1- Start: 10/07/22; Stop: 10/09/22 Inability to write sentence; gait instability; somnolence; cerebral edema; mild encephalopathy Dexamethasone- 10 mg q 6 hrs IV starting 10/08/22; decreased to 10 mg q 8 hrs PO on 10/09/22                 Prior & Concomitant Medication Log  - Patient currently taking the following meds:   - Dexamethasone dose now at 4 mg daily (since 1/29)     - See MAR for administration of antibiotics and supportive care meds during 1/12- 1/17 and 1/26- 11/06/22 admissions     - Prescribed fluconazole (400 mg daily) and penicillin (500 mg BID) at discharge on 10/10/22; discontinued penicillin during 1/12- 1/17 admission    - Fluconazole stopped 11/04/22, with recovery of ANC     - For constipation:    - Docusate sodium (100 mg cap BID PO)    - Senna-docusate (8.6- 50 mg; 1 tab nightly PO)     - For antiviral, antifungal and antibacterial prophylaxis:    - Valacyclovir (VALTREX)- 500 mg tab daily; started 09/24/22    - Levofloxacin (LEVAQUIN)-      - For COVID-19 infection    - Nirmatrelvir-ritonavir (PAXLOVID)- 1st course- 12/20- 10/03/22; 2nd course- 11/06/22 through 11/10/22     - For hyperlipidemia:    - Ezetimibe (Zetia)- 10 mg tab daily; PO- Started on 09/15/22   - For hx of COPD:     - Albuterol HFA (90 mcg/ actuation) inhaler    - Glycopyrrolate- formoterol (Bevespi aerosphere)- 2 puffs BID    - Ipratropium- albuterol (DUO-NEB)- 3 mL by nebulation q 6 hrs PRN     - For general health    - Cholecalciferol, vitamin D3-50 mcg, 2000 unit, 50 mcg-  1 tab daily    - Fish oil/ borage/ flax/ omega-3, -6, -9- 1 cap daily    - Multivitamin (TAB-A-VITE/ Theragran)- 1 tab daily   - For appetite    - DroNABinol (MARINOL)- 2.5 mg x 2 caps, or  5 mg; BID   - For anxiety:    - Escitalopram oxalate (Lexapro) 20 mg tab daily; PO   - For depression:    - Olanzapine (Zyprexa)- 5 mg tab nightly; PO    - Mirtazapine (Remeron)- 30 mg tab nightly; PO   - For prostate:    - Finasteride (Proscar)- 5 mg daily   - For diabetes:    - Metformin (Glucophage)- 1 tab (500 mg) BID; PO    - Humalog 100 U/inj pen- Injected under the skin 4x/daily (before meals and nightly)    - Insulin glargine- 0.12 mL (100 U/mL) under the skin nightly.      - For seizure prophylaxis:    - Levetiracetam (Keppra)- 500 mg tab BID; PO- On for 2 years   - For GERD prevention:    - Pantoprazole (Protonix)- 40 mg daily; PO   - For neurocognitive prophylaxis:    - Memantine (Namenda)- 1 tab; BID; PO   - For smoking cessation    - Nicotine patch          Medication log to record treatment given for cell therapy mediated toxicities    Medication Dose Route Start Date Stop Date Indication Related Med Hx or AE, if any   Dexamethasone 10 mg q 6 hours IV 10/08/22 10/09/22 ICANS vs disease progression (PCNSL)    Dexamethasone 10 mg q 8 hours PO 10/09/22 10/12/22 ICANS vs disease progression (PCNSL)    Dexamethasone 10 mg BID PO 10/13/22 10/14/22 ICANS vs disease progression (PCNSL)    Dexamethasone 10 mg daily PO 10/15/22 10/22/22 ICANS vs disease progression (PCNSL)    Dexamethasone 8 mg (4 tabs) daily PO 10/23/22 11/02/22 ICANS vs disease progression (PCNSL)    Dexamethasone 4 mg daily  PO  11/03/22 Ongoing ICANS vs disease progression (PCNSL)    Platelet transfusion 1 U irradiated platelets IV 10/18/22  10/23/22  10/30/22  11/04/22 10/18/22  10/23/22  10/30/22  11/04/22 Platelet count decrease (likely related to cell therapy)    Filgrastim-aafi (NIVESTYM) 300 mcg; once Sub-Q 10/24/22 10/24/22 Prolonged neutropenia     Pegfilgrastim (Udenyca) 6 mg once Sub-Q 10/30/22 10/30/22 Prolonged neutropenia

## 2022-11-09 DIAGNOSIS — R410 Disorientation, unspecified: Principal | ICD-10-CM

## 2022-11-09 DIAGNOSIS — F419 Anxiety disorder, unspecified: Principal | ICD-10-CM

## 2022-11-09 DIAGNOSIS — T380X5A Adverse effect of glucocorticoids and synthetic analogues, initial encounter: Principal | ICD-10-CM

## 2022-11-09 DIAGNOSIS — R634 Abnormal weight loss: Principal | ICD-10-CM

## 2022-11-09 DIAGNOSIS — R413 Other amnesia: Principal | ICD-10-CM

## 2022-11-09 DIAGNOSIS — F32A Depression, unspecified: Principal | ICD-10-CM

## 2022-11-09 DIAGNOSIS — T8082XD Complication of immune effector cellular therapy, subsequent encounter: Principal | ICD-10-CM

## 2022-11-09 DIAGNOSIS — Z923 Personal history of irradiation: Principal | ICD-10-CM

## 2022-11-09 DIAGNOSIS — R41842 Visuospatial deficit: Principal | ICD-10-CM

## 2022-11-09 DIAGNOSIS — E0965 Drug or chemical induced diabetes mellitus with hyperglycemia: Principal | ICD-10-CM

## 2022-11-09 DIAGNOSIS — Z6820 Body mass index (BMI) 20.0-20.9, adult: Principal | ICD-10-CM

## 2022-11-09 DIAGNOSIS — Z794 Long term (current) use of insulin: Principal | ICD-10-CM

## 2022-11-09 DIAGNOSIS — Z9181 History of falling: Principal | ICD-10-CM

## 2022-11-09 DIAGNOSIS — Z7952 Long term (current) use of systemic steroids: Principal | ICD-10-CM

## 2022-11-09 DIAGNOSIS — U071 COVID-19: Principal | ICD-10-CM

## 2022-11-09 DIAGNOSIS — Z7984 Long term (current) use of oral hypoglycemic drugs: Principal | ICD-10-CM

## 2022-11-09 DIAGNOSIS — R918 Other nonspecific abnormal finding of lung field: Principal | ICD-10-CM

## 2022-11-09 DIAGNOSIS — D61811 Other drug-induced pancytopenia: Principal | ICD-10-CM

## 2022-11-09 DIAGNOSIS — C8339 Diffuse large B-cell lymphoma, extranodal and solid organ sites: Principal | ICD-10-CM

## 2022-11-09 DIAGNOSIS — N4289 Other specified disorders of prostate: Principal | ICD-10-CM

## 2022-11-09 DIAGNOSIS — R479 Unspecified speech disturbances: Principal | ICD-10-CM

## 2022-11-09 DIAGNOSIS — J449 Chronic obstructive pulmonary disease, unspecified: Principal | ICD-10-CM

## 2022-11-10 ENCOUNTER — Ambulatory Visit: Admit: 2022-11-10 | Discharge: 2022-11-11 | Payer: PRIVATE HEALTH INSURANCE

## 2022-11-10 ENCOUNTER — Encounter
Admit: 2022-11-10 | Discharge: 2022-11-11 | Payer: PRIVATE HEALTH INSURANCE | Attending: Adult Health | Primary: Adult Health

## 2022-11-11 DIAGNOSIS — R410 Disorientation, unspecified: Principal | ICD-10-CM

## 2022-11-11 DIAGNOSIS — F419 Anxiety disorder, unspecified: Principal | ICD-10-CM

## 2022-11-11 DIAGNOSIS — U071 COVID-19: Principal | ICD-10-CM

## 2022-11-11 DIAGNOSIS — R41842 Visuospatial deficit: Principal | ICD-10-CM

## 2022-11-11 DIAGNOSIS — N4289 Other specified disorders of prostate: Principal | ICD-10-CM

## 2022-11-11 DIAGNOSIS — R918 Other nonspecific abnormal finding of lung field: Principal | ICD-10-CM

## 2022-11-11 DIAGNOSIS — D61811 Other drug-induced pancytopenia: Principal | ICD-10-CM

## 2022-11-11 DIAGNOSIS — Z7984 Long term (current) use of oral hypoglycemic drugs: Principal | ICD-10-CM

## 2022-11-11 DIAGNOSIS — T380X5A Adverse effect of glucocorticoids and synthetic analogues, initial encounter: Principal | ICD-10-CM

## 2022-11-11 DIAGNOSIS — R479 Unspecified speech disturbances: Principal | ICD-10-CM

## 2022-11-11 DIAGNOSIS — Z6820 Body mass index (BMI) 20.0-20.9, adult: Principal | ICD-10-CM

## 2022-11-11 DIAGNOSIS — F32A Depression, unspecified: Principal | ICD-10-CM

## 2022-11-11 DIAGNOSIS — C8339 Diffuse large B-cell lymphoma, extranodal and solid organ sites: Principal | ICD-10-CM

## 2022-11-11 DIAGNOSIS — Z7952 Long term (current) use of systemic steroids: Principal | ICD-10-CM

## 2022-11-11 DIAGNOSIS — Z923 Personal history of irradiation: Principal | ICD-10-CM

## 2022-11-11 DIAGNOSIS — R413 Other amnesia: Principal | ICD-10-CM

## 2022-11-11 DIAGNOSIS — Z794 Long term (current) use of insulin: Principal | ICD-10-CM

## 2022-11-11 DIAGNOSIS — T8082XD Complication of immune effector cellular therapy, subsequent encounter: Principal | ICD-10-CM

## 2022-11-11 DIAGNOSIS — R634 Abnormal weight loss: Principal | ICD-10-CM

## 2022-11-11 DIAGNOSIS — Z9181 History of falling: Principal | ICD-10-CM

## 2022-11-11 DIAGNOSIS — J449 Chronic obstructive pulmonary disease, unspecified: Principal | ICD-10-CM

## 2022-11-11 DIAGNOSIS — E0965 Drug or chemical induced diabetes mellitus with hyperglycemia: Principal | ICD-10-CM

## 2022-11-12 DIAGNOSIS — R634 Abnormal weight loss: Principal | ICD-10-CM

## 2022-11-12 DIAGNOSIS — R41842 Visuospatial deficit: Principal | ICD-10-CM

## 2022-11-12 DIAGNOSIS — N4289 Other specified disorders of prostate: Principal | ICD-10-CM

## 2022-11-12 DIAGNOSIS — Z7952 Long term (current) use of systemic steroids: Principal | ICD-10-CM

## 2022-11-12 DIAGNOSIS — T8082XD Complication of immune effector cellular therapy, subsequent encounter: Principal | ICD-10-CM

## 2022-11-12 DIAGNOSIS — Z6820 Body mass index (BMI) 20.0-20.9, adult: Principal | ICD-10-CM

## 2022-11-12 DIAGNOSIS — T380X5A Adverse effect of glucocorticoids and synthetic analogues, initial encounter: Principal | ICD-10-CM

## 2022-11-12 DIAGNOSIS — Z923 Personal history of irradiation: Principal | ICD-10-CM

## 2022-11-12 DIAGNOSIS — Z9181 History of falling: Principal | ICD-10-CM

## 2022-11-12 DIAGNOSIS — J449 Chronic obstructive pulmonary disease, unspecified: Principal | ICD-10-CM

## 2022-11-12 DIAGNOSIS — C8339 Diffuse large B-cell lymphoma, extranodal and solid organ sites: Principal | ICD-10-CM

## 2022-11-12 DIAGNOSIS — E0965 Drug or chemical induced diabetes mellitus with hyperglycemia: Principal | ICD-10-CM

## 2022-11-12 DIAGNOSIS — R918 Other nonspecific abnormal finding of lung field: Principal | ICD-10-CM

## 2022-11-12 DIAGNOSIS — R410 Disorientation, unspecified: Principal | ICD-10-CM

## 2022-11-12 DIAGNOSIS — D61811 Other drug-induced pancytopenia: Principal | ICD-10-CM

## 2022-11-12 DIAGNOSIS — Z7984 Long term (current) use of oral hypoglycemic drugs: Principal | ICD-10-CM

## 2022-11-12 DIAGNOSIS — F419 Anxiety disorder, unspecified: Principal | ICD-10-CM

## 2022-11-12 DIAGNOSIS — U071 COVID-19: Principal | ICD-10-CM

## 2022-11-12 DIAGNOSIS — Z794 Long term (current) use of insulin: Principal | ICD-10-CM

## 2022-11-12 DIAGNOSIS — F32A Depression, unspecified: Principal | ICD-10-CM

## 2022-11-12 DIAGNOSIS — R479 Unspecified speech disturbances: Principal | ICD-10-CM

## 2022-11-12 DIAGNOSIS — R413 Other amnesia: Principal | ICD-10-CM

## 2022-11-14 DIAGNOSIS — U071 COVID-19: Principal | ICD-10-CM

## 2022-11-14 DIAGNOSIS — C8339 Diffuse large B-cell lymphoma, extranodal and solid organ sites: Principal | ICD-10-CM

## 2022-11-14 DIAGNOSIS — F32A Depression, unspecified: Principal | ICD-10-CM

## 2022-11-14 DIAGNOSIS — R413 Other amnesia: Principal | ICD-10-CM

## 2022-11-14 DIAGNOSIS — R41842 Visuospatial deficit: Principal | ICD-10-CM

## 2022-11-14 DIAGNOSIS — Z7984 Long term (current) use of oral hypoglycemic drugs: Principal | ICD-10-CM

## 2022-11-14 DIAGNOSIS — T8082XD Complication of immune effector cellular therapy, subsequent encounter: Principal | ICD-10-CM

## 2022-11-14 DIAGNOSIS — F419 Anxiety disorder, unspecified: Principal | ICD-10-CM

## 2022-11-14 DIAGNOSIS — C8589 Other specified types of non-Hodgkin lymphoma, extranodal and solid organ sites: Principal | ICD-10-CM

## 2022-11-14 DIAGNOSIS — C8330 Diffuse large B-cell lymphoma, unspecified site: Principal | ICD-10-CM

## 2022-11-14 DIAGNOSIS — D61811 Other drug-induced pancytopenia: Principal | ICD-10-CM

## 2022-11-14 DIAGNOSIS — R410 Disorientation, unspecified: Principal | ICD-10-CM

## 2022-11-14 DIAGNOSIS — Z7952 Long term (current) use of systemic steroids: Principal | ICD-10-CM

## 2022-11-14 DIAGNOSIS — T380X5A Adverse effect of glucocorticoids and synthetic analogues, initial encounter: Principal | ICD-10-CM

## 2022-11-14 DIAGNOSIS — J449 Chronic obstructive pulmonary disease, unspecified: Principal | ICD-10-CM

## 2022-11-14 DIAGNOSIS — R479 Unspecified speech disturbances: Principal | ICD-10-CM

## 2022-11-14 DIAGNOSIS — Z9181 History of falling: Principal | ICD-10-CM

## 2022-11-14 DIAGNOSIS — Z6820 Body mass index (BMI) 20.0-20.9, adult: Principal | ICD-10-CM

## 2022-11-14 DIAGNOSIS — N4289 Other specified disorders of prostate: Principal | ICD-10-CM

## 2022-11-14 DIAGNOSIS — Z923 Personal history of irradiation: Principal | ICD-10-CM

## 2022-11-14 DIAGNOSIS — E0965 Drug or chemical induced diabetes mellitus with hyperglycemia: Principal | ICD-10-CM

## 2022-11-14 DIAGNOSIS — R918 Other nonspecific abnormal finding of lung field: Principal | ICD-10-CM

## 2022-11-14 DIAGNOSIS — R634 Abnormal weight loss: Principal | ICD-10-CM

## 2022-11-14 DIAGNOSIS — Z794 Long term (current) use of insulin: Principal | ICD-10-CM

## 2022-11-14 MED ORDER — PEN NEEDLE, DIABETIC 32 GAUGE X 1/4" (6 MM)
0 refills | 0 days
Start: 2022-11-14 — End: ?

## 2022-11-14 MED ORDER — FINASTERIDE 5 MG TABLET
ORAL_TABLET | Freq: Every day | ORAL | 1 refills | 90 days | Status: CP
Start: 2022-11-14 — End: 2023-11-14
  Filled 2022-11-17: qty 90, 90d supply, fill #0

## 2022-11-14 MED ORDER — SENNOSIDES 8.6 MG-DOCUSATE SODIUM 50 MG TABLET
ORAL_TABLET | Freq: Every evening | ORAL | 1 refills | 100 days | Status: CP
Start: 2022-11-14 — End: 2023-11-14

## 2022-11-14 MED ORDER — LANCETS
0 refills | 0 days
Start: 2022-11-14 — End: ?

## 2022-11-14 MED ORDER — ACCU-CHEK GUIDE TEST STRIPS
0 refills | 0 days
Start: 2022-11-14 — End: ?

## 2022-11-14 MED ORDER — DEXAMETHASONE 2 MG TABLET
ORAL_TABLET | Freq: Every day | ORAL | 0 refills | 30 days
Start: 2022-11-14 — End: ?

## 2022-11-14 MED ORDER — PAXLOVID 300 MG (150 MG X 2)-100 MG TABLETS IN A DOSE PACK
ORAL_TABLET | 0 refills | 0 days
Start: 2022-11-14 — End: ?

## 2022-11-14 MED ORDER — ESCITALOPRAM 20 MG TABLET
ORAL_TABLET | Freq: Every day | ORAL | 2 refills | 90 days | Status: CP
Start: 2022-11-14 — End: ?
  Filled 2022-11-17: qty 90, 90d supply, fill #0

## 2022-11-14 MED ORDER — NICOTINE 21 MG/24 HR DAILY TRANSDERMAL PATCH
MEDICATED_PATCH | TRANSDERMAL | 0 refills | 28 days | Status: CP
Start: 2022-11-14 — End: 2023-11-14

## 2022-11-14 MED ORDER — DOCUSATE SODIUM 100 MG CAPSULE
ORAL_CAPSULE | Freq: Two times a day (BID) | ORAL | 1 refills | 90 days | Status: CP
Start: 2022-11-14 — End: 2023-11-14
  Filled 2022-11-17: qty 180, 90d supply, fill #0

## 2022-11-16 DIAGNOSIS — C8330 Diffuse large B-cell lymphoma, unspecified site: Principal | ICD-10-CM

## 2022-11-16 MED ORDER — METFORMIN 500 MG TABLET
ORAL_TABLET | 0 refills | 0 days
Start: 2022-11-16 — End: ?

## 2022-11-16 MED ORDER — PANTOPRAZOLE 40 MG TABLET,DELAYED RELEASE
ORAL_TABLET | Freq: Every day | ORAL | 0 refills | 0 days
Start: 2022-11-16 — End: ?

## 2022-11-17 ENCOUNTER — Encounter
Admit: 2022-11-17 | Discharge: 2022-11-18 | Payer: PRIVATE HEALTH INSURANCE | Attending: Adult Health | Primary: Adult Health

## 2022-11-17 ENCOUNTER — Ambulatory Visit: Admit: 2022-11-17 | Discharge: 2022-11-18 | Payer: PRIVATE HEALTH INSURANCE

## 2022-11-17 MED ORDER — METFORMIN 500 MG TABLET
ORAL_TABLET | Freq: Two times a day (BID) | ORAL | 1 refills | 90 days | Status: CP
Start: 2022-11-17 — End: 2023-11-17

## 2022-11-17 MED ORDER — PANTOPRAZOLE 40 MG TABLET,DELAYED RELEASE
ORAL_TABLET | Freq: Every day | ORAL | 0 refills | 30 days | Status: CP
Start: 2022-11-17 — End: ?

## 2022-11-17 MED ORDER — LANCETS
0 refills | 0 days | Status: CP
Start: 2022-11-17 — End: ?

## 2022-11-17 MED ORDER — PEN NEEDLE, DIABETIC 32 GAUGE X 1/4" (6 MM)
0 refills | 0 days | Status: CP
Start: 2022-11-17 — End: ?

## 2022-11-17 MED ORDER — ACCU-CHEK GUIDE TEST STRIPS
0 refills | 0 days | Status: CP
Start: 2022-11-17 — End: ?

## 2022-11-17 MED FILL — DENTAGEL 1.1 %: 30 days supply | Qty: 56 | Fill #0

## 2022-11-17 MED FILL — STIMULANT LAXATIVE PLUS 8.6 MG-50 MG TABLET: ORAL | 100 days supply | Qty: 100 | Fill #0

## 2022-11-18 DIAGNOSIS — N4289 Other specified disorders of prostate: Principal | ICD-10-CM

## 2022-11-18 DIAGNOSIS — C8339 Diffuse large B-cell lymphoma, extranodal and solid organ sites: Principal | ICD-10-CM

## 2022-11-18 DIAGNOSIS — F32A Depression, unspecified: Principal | ICD-10-CM

## 2022-11-18 DIAGNOSIS — J449 Chronic obstructive pulmonary disease, unspecified: Principal | ICD-10-CM

## 2022-11-18 DIAGNOSIS — R41842 Visuospatial deficit: Principal | ICD-10-CM

## 2022-11-18 DIAGNOSIS — T380X5A Adverse effect of glucocorticoids and synthetic analogues, initial encounter: Principal | ICD-10-CM

## 2022-11-18 DIAGNOSIS — D61811 Other drug-induced pancytopenia: Principal | ICD-10-CM

## 2022-11-18 DIAGNOSIS — R413 Other amnesia: Principal | ICD-10-CM

## 2022-11-18 DIAGNOSIS — U071 COVID-19: Principal | ICD-10-CM

## 2022-11-18 DIAGNOSIS — E0965 Drug or chemical induced diabetes mellitus with hyperglycemia: Principal | ICD-10-CM

## 2022-11-18 DIAGNOSIS — R410 Disorientation, unspecified: Principal | ICD-10-CM

## 2022-11-18 DIAGNOSIS — R479 Unspecified speech disturbances: Principal | ICD-10-CM

## 2022-11-18 DIAGNOSIS — Z794 Long term (current) use of insulin: Principal | ICD-10-CM

## 2022-11-18 DIAGNOSIS — Z923 Personal history of irradiation: Principal | ICD-10-CM

## 2022-11-18 DIAGNOSIS — R918 Other nonspecific abnormal finding of lung field: Principal | ICD-10-CM

## 2022-11-18 DIAGNOSIS — T8082XD Complication of immune effector cellular therapy, subsequent encounter: Principal | ICD-10-CM

## 2022-11-18 DIAGNOSIS — F419 Anxiety disorder, unspecified: Principal | ICD-10-CM

## 2022-11-18 DIAGNOSIS — R634 Abnormal weight loss: Principal | ICD-10-CM

## 2022-11-18 DIAGNOSIS — Z7984 Long term (current) use of oral hypoglycemic drugs: Principal | ICD-10-CM

## 2022-11-18 DIAGNOSIS — Z6820 Body mass index (BMI) 20.0-20.9, adult: Principal | ICD-10-CM

## 2022-11-18 DIAGNOSIS — Z7952 Long term (current) use of systemic steroids: Principal | ICD-10-CM

## 2022-11-18 DIAGNOSIS — Z9181 History of falling: Principal | ICD-10-CM

## 2022-11-20 ENCOUNTER — Ambulatory Visit: Admit: 2022-11-20 | Payer: PRIVATE HEALTH INSURANCE

## 2022-11-20 DIAGNOSIS — C8589 Other specified types of non-Hodgkin lymphoma, extranodal and solid organ sites: Principal | ICD-10-CM

## 2022-11-21 ENCOUNTER — Encounter: Admit: 2022-11-21 | Discharge: 2022-11-22 | Payer: PRIVATE HEALTH INSURANCE

## 2022-11-21 ENCOUNTER — Ambulatory Visit: Admit: 2022-11-21 | Discharge: 2022-11-22 | Payer: PRIVATE HEALTH INSURANCE

## 2022-11-21 ENCOUNTER — Other Ambulatory Visit: Admit: 2022-11-21 | Discharge: 2022-11-22 | Payer: PRIVATE HEALTH INSURANCE

## 2022-11-21 DIAGNOSIS — R413 Other amnesia: Principal | ICD-10-CM

## 2022-11-21 DIAGNOSIS — R410 Disorientation, unspecified: Principal | ICD-10-CM

## 2022-11-21 DIAGNOSIS — F32A Depression, unspecified: Principal | ICD-10-CM

## 2022-11-21 DIAGNOSIS — D61811 Other drug-induced pancytopenia: Principal | ICD-10-CM

## 2022-11-21 DIAGNOSIS — Z6820 Body mass index (BMI) 20.0-20.9, adult: Principal | ICD-10-CM

## 2022-11-21 DIAGNOSIS — C8339 Diffuse large B-cell lymphoma, extranodal and solid organ sites: Principal | ICD-10-CM

## 2022-11-21 DIAGNOSIS — N4289 Other specified disorders of prostate: Principal | ICD-10-CM

## 2022-11-21 DIAGNOSIS — R41842 Visuospatial deficit: Principal | ICD-10-CM

## 2022-11-21 DIAGNOSIS — F419 Anxiety disorder, unspecified: Principal | ICD-10-CM

## 2022-11-21 DIAGNOSIS — T380X5A Adverse effect of glucocorticoids and synthetic analogues, initial encounter: Principal | ICD-10-CM

## 2022-11-21 DIAGNOSIS — R634 Abnormal weight loss: Principal | ICD-10-CM

## 2022-11-21 DIAGNOSIS — C8589 Other specified types of non-Hodgkin lymphoma, extranodal and solid organ sites: Principal | ICD-10-CM

## 2022-11-21 DIAGNOSIS — Z7952 Long term (current) use of systemic steroids: Principal | ICD-10-CM

## 2022-11-21 DIAGNOSIS — Z9181 History of falling: Principal | ICD-10-CM

## 2022-11-21 DIAGNOSIS — R918 Other nonspecific abnormal finding of lung field: Principal | ICD-10-CM

## 2022-11-21 DIAGNOSIS — U071 COVID-19: Principal | ICD-10-CM

## 2022-11-21 DIAGNOSIS — T8082XD Complication of immune effector cellular therapy, subsequent encounter: Principal | ICD-10-CM

## 2022-11-21 DIAGNOSIS — E0965 Drug or chemical induced diabetes mellitus with hyperglycemia: Principal | ICD-10-CM

## 2022-11-21 DIAGNOSIS — R479 Unspecified speech disturbances: Principal | ICD-10-CM

## 2022-11-21 DIAGNOSIS — Z794 Long term (current) use of insulin: Principal | ICD-10-CM

## 2022-11-21 DIAGNOSIS — Z7984 Long term (current) use of oral hypoglycemic drugs: Principal | ICD-10-CM

## 2022-11-21 DIAGNOSIS — J449 Chronic obstructive pulmonary disease, unspecified: Principal | ICD-10-CM

## 2022-11-21 DIAGNOSIS — Z923 Personal history of irradiation: Principal | ICD-10-CM

## 2022-11-25 DIAGNOSIS — Z7984 Long term (current) use of oral hypoglycemic drugs: Principal | ICD-10-CM

## 2022-11-25 DIAGNOSIS — Z794 Long term (current) use of insulin: Principal | ICD-10-CM

## 2022-11-25 DIAGNOSIS — R479 Unspecified speech disturbances: Principal | ICD-10-CM

## 2022-11-25 DIAGNOSIS — R413 Other amnesia: Principal | ICD-10-CM

## 2022-11-25 DIAGNOSIS — C8339 Diffuse large B-cell lymphoma, extranodal and solid organ sites: Principal | ICD-10-CM

## 2022-11-25 DIAGNOSIS — R918 Other nonspecific abnormal finding of lung field: Principal | ICD-10-CM

## 2022-11-25 DIAGNOSIS — D61811 Other drug-induced pancytopenia: Principal | ICD-10-CM

## 2022-11-25 DIAGNOSIS — E0965 Drug or chemical induced diabetes mellitus with hyperglycemia: Principal | ICD-10-CM

## 2022-11-25 DIAGNOSIS — F419 Anxiety disorder, unspecified: Principal | ICD-10-CM

## 2022-11-25 DIAGNOSIS — F32A Depression, unspecified: Principal | ICD-10-CM

## 2022-11-25 DIAGNOSIS — R41842 Visuospatial deficit: Principal | ICD-10-CM

## 2022-11-25 DIAGNOSIS — J449 Chronic obstructive pulmonary disease, unspecified: Principal | ICD-10-CM

## 2022-11-25 DIAGNOSIS — Z9181 History of falling: Principal | ICD-10-CM

## 2022-11-25 DIAGNOSIS — Z6820 Body mass index (BMI) 20.0-20.9, adult: Principal | ICD-10-CM

## 2022-11-25 DIAGNOSIS — U071 COVID-19: Principal | ICD-10-CM

## 2022-11-25 DIAGNOSIS — Z7952 Long term (current) use of systemic steroids: Principal | ICD-10-CM

## 2022-11-25 DIAGNOSIS — R634 Abnormal weight loss: Principal | ICD-10-CM

## 2022-11-25 DIAGNOSIS — N4289 Other specified disorders of prostate: Principal | ICD-10-CM

## 2022-11-25 DIAGNOSIS — T8082XD Complication of immune effector cellular therapy, subsequent encounter: Principal | ICD-10-CM

## 2022-11-25 DIAGNOSIS — Z923 Personal history of irradiation: Principal | ICD-10-CM

## 2022-11-25 DIAGNOSIS — T380X5A Adverse effect of glucocorticoids and synthetic analogues, initial encounter: Principal | ICD-10-CM

## 2022-11-25 DIAGNOSIS — R410 Disorientation, unspecified: Principal | ICD-10-CM

## 2022-11-27 ENCOUNTER — Ambulatory Visit: Admit: 2022-11-27 | Discharge: 2022-11-28 | Payer: PRIVATE HEALTH INSURANCE

## 2022-11-28 ENCOUNTER — Encounter: Admit: 2022-11-28 | Payer: PRIVATE HEALTH INSURANCE

## 2022-11-28 ENCOUNTER — Encounter: Admit: 2022-11-28 | Discharge: 2022-12-27 | Payer: PRIVATE HEALTH INSURANCE

## 2022-11-28 ENCOUNTER — Ambulatory Visit: Admit: 2022-11-28 | Discharge: 2022-11-29 | Payer: PRIVATE HEALTH INSURANCE

## 2022-11-28 DIAGNOSIS — D61818 Other pancytopenia: Principal | ICD-10-CM

## 2022-11-28 DIAGNOSIS — Z9285 History of chimeric antigen receptor T-cell therapy: Principal | ICD-10-CM

## 2022-11-28 DIAGNOSIS — C8589 Other specified types of non-Hodgkin lymphoma, extranodal and solid organ sites: Principal | ICD-10-CM

## 2022-12-03 ENCOUNTER — Ambulatory Visit: Admit: 2022-12-03 | Discharge: 2022-12-04 | Payer: PRIVATE HEALTH INSURANCE

## 2022-12-05 ENCOUNTER — Other Ambulatory Visit: Admit: 2022-12-05 | Discharge: 2022-12-05 | Payer: PRIVATE HEALTH INSURANCE

## 2022-12-05 ENCOUNTER — Ambulatory Visit: Admit: 2022-12-05 | Discharge: 2022-12-05 | Payer: PRIVATE HEALTH INSURANCE

## 2022-12-05 DIAGNOSIS — C8589 Other specified types of non-Hodgkin lymphoma, extranodal and solid organ sites: Principal | ICD-10-CM

## 2022-12-05 MED ORDER — DEXAMETHASONE 2 MG TABLET
ORAL_TABLET | Freq: Every day | ORAL | 1 refills | 30 days | Status: CP
Start: 2022-12-05 — End: 2023-02-03

## 2022-12-07 DIAGNOSIS — C8330 Diffuse large B-cell lymphoma, unspecified site: Principal | ICD-10-CM

## 2022-12-07 MED ORDER — FLUCONAZOLE 200 MG TABLET
Freq: Every day | ORAL | 0 refills | 0 days
Start: 2022-12-07 — End: ?

## 2022-12-07 MED ORDER — PANTOPRAZOLE 40 MG TABLET,DELAYED RELEASE
ORAL_TABLET | Freq: Every day | ORAL | 0 refills | 30 days
Start: 2022-12-07 — End: ?

## 2022-12-08 MED ORDER — FLUCONAZOLE 200 MG TABLET
ORAL_TABLET | Freq: Every day | ORAL | 0 refills | 0.00000 days | Status: CP
Start: 2022-12-08 — End: ?

## 2022-12-08 MED ORDER — PANTOPRAZOLE 40 MG TABLET,DELAYED RELEASE
ORAL_TABLET | Freq: Every day | ORAL | 5 refills | 30 days | Status: CP
Start: 2022-12-08 — End: ?

## 2022-12-09 MED ORDER — DRONABINOL 5 MG CAPSULE
ORAL_CAPSULE | Freq: Two times a day (BID) | ORAL | 0 refills | 30 days
Start: 2022-12-09 — End: 2023-01-08

## 2022-12-09 MED ORDER — NICOTINE 21 MG/24 HR DAILY TRANSDERMAL PATCH
MEDICATED_PATCH | TRANSDERMAL | 0 refills | 28 days
Start: 2022-12-09 — End: 2023-12-09

## 2022-12-10 MED ORDER — NICOTINE 21 MG/24 HR DAILY TRANSDERMAL PATCH
MEDICATED_PATCH | TRANSDERMAL | 0 refills | 28 days | Status: CP
Start: 2022-12-10 — End: 2023-12-10
  Filled 2022-12-26: qty 28, 28d supply, fill #0

## 2022-12-10 MED ORDER — DRONABINOL 5 MG CAPSULE
ORAL_CAPSULE | Freq: Two times a day (BID) | ORAL | 0 refills | 30 days | Status: CP
Start: 2022-12-10 — End: 2023-01-09
  Filled 2022-12-26: qty 60, 30d supply, fill #0

## 2022-12-17 ENCOUNTER — Ambulatory Visit
Admit: 2022-12-17 | Discharge: 2022-12-18 | Payer: PRIVATE HEALTH INSURANCE | Attending: Student in an Organized Health Care Education/Training Program | Primary: Student in an Organized Health Care Education/Training Program

## 2022-12-24 ENCOUNTER — Ambulatory Visit: Admit: 2022-12-24 | Discharge: 2022-12-25 | Payer: PRIVATE HEALTH INSURANCE

## 2022-12-24 DIAGNOSIS — J432 Centrilobular emphysema: Principal | ICD-10-CM

## 2022-12-24 DIAGNOSIS — F17200 Nicotine dependence, unspecified, uncomplicated: Principal | ICD-10-CM

## 2022-12-26 ENCOUNTER — Encounter: Admit: 2022-12-26 | Discharge: 2022-12-26 | Payer: PRIVATE HEALTH INSURANCE

## 2022-12-26 ENCOUNTER — Ambulatory Visit: Admit: 2022-12-26 | Discharge: 2022-12-26 | Payer: PRIVATE HEALTH INSURANCE

## 2022-12-26 ENCOUNTER — Other Ambulatory Visit: Admit: 2022-12-26 | Discharge: 2022-12-26 | Payer: PRIVATE HEALTH INSURANCE

## 2022-12-26 DIAGNOSIS — C8589 Other specified types of non-Hodgkin lymphoma, extranodal and solid organ sites: Principal | ICD-10-CM

## 2023-01-16 ENCOUNTER — Ambulatory Visit: Admit: 2023-01-16 | Discharge: 2023-01-17 | Discharge status: Against Medical Advice | Payer: PRIVATE HEALTH INSURANCE

## 2023-01-16 ENCOUNTER — Emergency Department: Admit: 2023-01-16 | Discharge: 2023-01-17 | Discharge status: Against Medical Advice | Payer: PRIVATE HEALTH INSURANCE

## 2023-01-19 ENCOUNTER — Encounter
Admit: 2023-01-19 | Discharge: 2023-01-22 | Disposition: A | Discharge status: Home Health | Payer: PRIVATE HEALTH INSURANCE | Attending: Medical Oncology | Admitting: Medical Oncology

## 2023-01-19 ENCOUNTER — Ambulatory Visit
Admit: 2023-01-19 | Discharge: 2023-01-22 | Disposition: A | Discharge status: Home Health | Payer: PRIVATE HEALTH INSURANCE | Admitting: Medical Oncology

## 2023-01-22 ENCOUNTER — Inpatient Hospital Stay: Admit: 2023-01-22 | Payer: PRIVATE HEALTH INSURANCE

## 2023-01-22 MED ORDER — DEXAMETHASONE 4 MG TABLET
ORAL_TABLET | Freq: Two times a day (BID) | ORAL | 0 refills | 30 days | Status: CP
Start: 2023-01-22 — End: 2023-02-21
  Filled 2023-01-22: qty 60, 30d supply, fill #0

## 2023-01-22 MED ORDER — METFORMIN 500 MG TABLET
ORAL_TABLET | Freq: Two times a day (BID) | ORAL | 0 refills | 30 days | Status: CN
Start: 2023-01-22 — End: 2023-02-21

## 2023-01-23 DIAGNOSIS — C8589 Other specified types of non-Hodgkin lymphoma, extranodal and solid organ sites: Principal | ICD-10-CM

## 2023-01-26 NOTE — Unmapped (Signed)
[facility] Specialty Medication Onboarding    Specialty Medication: Temozolomide 5mg  capsule  Prior Authorization: Approved   Financial Assistance: No - copay  <$25  Final Copay/Day Supply: $0 / 28 days    Insurance Restrictions: None     Notes to Pharmacist: N/A      [facility] Specialty Medication Onboarding    Specialty Medication: Temozolomide 20mg  capsule  Prior Authorization: Approved   Financial Assistance: No - copay  <$25  Final Copay/Day Supply: $0 / 28 days    Insurance Restrictions: None     Notes to Pharmacist: N/A    The triage team has completed the benefits investigation and has determined that the patient is able to fill this medication at [facility]. Please contact the patient to complete the onboarding or follow up with the prescribing physician as needed.

## 2023-01-28 NOTE — Unmapped (Signed)
[facility]   Patient Onboarding/Medication Counseling    Andre Duncan is a 57 y.o. 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  I am speaking to the patient's family member, wife .    Was a Nurse, learning disability used for this call? No    Verified patient's date of birth / HIPAA.    Specialty medication(s) to be sent: Hematology/Oncology: Temozolomide 20 mg and Temozolomide 5mg     Non-specialty medications/supplies to be sent: none    Medications not needed at this time: none     Temodar (temozolomide)    Medication & Administration   Dosage: Take 2 capsules (20 mg strength) plus 2 capsules (5 mg strength) by mouth daily for 50 mg total daily dose.    Administration:   Take without food (on an empty stomach).    At least 1 hour before our two hours after meals  Take with a full glass of water  Swallow capsules whole, do not open or chew.  Anti-emetics are recommended to prevent nausea and vomiting. Take prescribed anti-emetic 30 minutes prior to scheduled dose.  If you throw up after taking this drug, do not repeat the dose.    Remember to take the medication as prescribed.  Your dose may be made up of 2 or more different strengths and colors of capsules.       Adherence/Missed dose instructions:   If you miss a dose, contact your prescriber immediately for instructions for further instructions.  If you throw up after taking this drug, do not repeat the dose  Goals of Therapy     To prevent disease progression  To treat brain cancer  Side Effects & Monitoring Parameters     Commonly reported side effects:  Fatigue  Nausea, vomiting, constipation, diarrhea, stomach pain/upset stomach (Usually antiemetic prescribed for nausea, can take stool softener/Miralax for constipation, Imodium AD/loperamide for diarrhea)  Abdominal pain  Change in taste, loss of appetite  Decreased red blood cell count (anemia) - feeling dizzy, sleepy, tired, or weak  Weight gain  Back pain, muscle pain, joint pain  Difficulty seeping  Common cold symptoms  Hair loss  Dry skin  Headache  Mouth irritation or mouth sores (may use baking soda or salt water rinses 3-4 times daily)    The following side effects should be reported to the provider:  Signs of infection (fever >100.4, chills, mouth sores, sputum production)  Unexplained bruising or bleeding (vomiting or coughing up blood, blood that looks like coffee grounds, blood in the urine or black, red tarry stools, bruising that gets bigger without reason, any persistent or severe bleeding, impaired wound healing)  Signs of liver problems (dark urine, abdominal pain, light-colored stools, vomiting, yellow skin or eyes, not hungry)  Any abdominal burning, numbness, or tingling feeling  Signs of cerebrovascular disease (change in strength on one side is greater than the other, trouble speaking or thinking, change in balance, drooping on one side of the face, or vision changes)  Shortness of breath, excess weight gain, swelling in arms or legs  Confusion, mood changes, trouble with memory, seizures  Difficulty controlling bladder  Burning or numbness feeling  Severe headache  Vision changes  Difficulty swallowing  Severe loss of energy and strength  Pale skin, pinpoint red spots on skin  Signs of allergic reaction / anaphylaxis (wheezing, chest tightness, swelling of face, lips, tongue or throat)  Rash (physician can prescribe prednisone to alleviate)  Breast pain  Monitoring Parameters:   CBC with differential and platelets at baseline and during treatment  Liver function at baseline and during treatment  Evaluate pregnancy status prior to use in females of reproductive potential  Monitor for lymphopenia and for signs/symptoms of Pneumocystis jirovecii pneumonia  Monitor adherence  Contraindications, Warnings, & Precautions     Contraindications:  Hypersensitivity to temozolomide or any component of the formulation  Hypersensitivity to dacarbazine Warnings & Precautions:  Bone marrow suppression: Myelosuppression some with fatal outcomes, may occur. Hematologic toxicity may require treatment interruption, dose reduction, and/or discontinuation.   GI toxicity: Temozolomide is associated with a moderate emetic potential; antiemetics may be recommended to prevent nausea and vomiting.  Hepatotoxicity: Hepatotoxicity has been reported; may be severe or fatal. Monitor liver function tests at baseline, halfway through the first cycle, prior to each subsequent cycle, and at ~2 to 4 weeks after the last temozolomide dose.  Hypersensitivity: Allergic reactions (including anaphylaxis) have been observed with temozolomide.  Pneumocystis pneumonia: Pneumocystis jirovecii pneumonia (PCP) may occur in patients receiving temozolomide; risk is increased in those receiving corticosteroids or with longer temozolomide treatment regimens. Monitor all patients for development of lymphopenia and PCP.   Secondary malignancies: Cases of myelodysplastic syndromes and secondary malignancies, including myeloid leukemia, have been reported following treatment with temozolomide.  Avoid sun exposure  Hepatic impairment: Use with caution in patients with severe hepatic impairment.  Renal impairment: Use with caution in patients with severe renal impairment; has not been studied in dialysis patients.  Elderly: Patients ?55 years of age experienced a higher incidence of grade 4 neutropenia and thrombocytopenia in cycle 1 (compared to younger patients).  Polysorbate 80: Some dosage forms may contain polysorbate 80 (also known as Tweens). Hypersensitivity reactions, usually a delayed reaction, have been reported following exposure to pharmaceutical products containing polysorbate 80 in certain individuals  Temozolomide resistance: Increased MGMT activity/levels within tumor tissue is associated with temozolomide resistance.   Reproductive/Breast Feeding Considerations     Reproductive Considerations: Evaluate pregnancy status prior to use in females of reproductive potential. Females of reproductive potential should use effective contraception during treatment and for at least 6 months after the last temozolomide dose. Males with pregnant partners or with male partners of reproductive potential should use condoms during treatment and for at least 3 months after the last temozolomide dose. Males should not donate semen during treatment and for at least 3 months after the last temozolomide dose.  Temozolomide may impair fertility; limited data indicate changes in sperm parameters during temozolomide treatment; however, there is no information in duration or reversibility of sperm changes.  Based on the mechanism of action and findings in animal reproduction studies, in utero exposure to temozolomide may cause fetal harm.  Breastfeeding Considerations: It is not known if temozolomide is present in breast milk. Due to the potential for serious adverse reactions (including myelosuppression) in the breastfed infant, breastfeeding is not recommended by the manufacturer during treatment and for at least 1 week after the last temozolomide dose  Drug/Food Interactions     Medication list reviewed in Epic. The patient was instructed to inform the care team before taking any new medications or supplements. No drug interactions identified.   Do not receive immunizations/vaccinations without contacting doctor, including flu shot.  Food reduces the rate and extent of absorption.  Storage, Handling Precautions, & Disposal     Store at room temperature in the original container in a dry place (do not use a pillbox or store with other medications).  Caregivers helping administer medication should wear gloves and wash hands immediately after.  This drug is considered hazardous and should be handled as little as possible.   Keep the lid tightly closed. Keep out of the reach of children and pets.  If the capsule I opened or broken, do not touch the contents. If the contents are touched or they get in the eyes, wash hands or eyes immediately.    Do not flush down a toilet or pour down a drain unless instructed to do so.  Check with your local police department or fire station about drug take-back programs in your area.     Current Medications (including OTC/herbals), Comorbidities and Allergies     Current Outpatient Medications   Medication Sig Dispense Refill    acetaminophen (TYLENOL EXTRA STRENGTH) 500 MG tablet Take 1 tablet (500 mg total) by mouth every six (6) hours as needed for pain.      albuterol HFA 90 mcg/actuation inhaler Inhale 2-4 puffs every four (4) hours as needed for wheezing. With Spacer 18 g 3    aluminum-magnesium hydroxide (MAALOX) 200-200 mg/5 mL suspension Take by mouth every six (6) hours as needed for indigestion.      blood sugar diagnostic (ACCU-CHEK GUIDE TEST STRIPS) Strp Use as instructed. Check blood sugar four times a day (before meals and at bedtime). 100 each 0    blood-glucose meter kit Use as instructed. Check blood sugar four times a day (before meals and at bedtime). 1 each 0    cetirizine (ZYRTEC) 10 MG tablet Take 1 tablet (10 mg total) by mouth nightly.      cholecalciferol, vitamin D3-50 mcg, 2,000 unit,, 50 mcg (2,000 unit) tablet Take 1 tablet (50 mcg total) by mouth daily.      dexAMETHasone (DECADRON) 4 MG tablet Take 1 tablet (4 mg total) by mouth every twelve (12) hours. 60 tablet 0    docusate sodium (COLACE) 100 MG capsule Take 1 capsule (100 mg total) by mouth two (2) times a day. 180 capsule 1    escitalopram oxalate (LEXAPRO) 20 MG tablet Take 1 tablet (20 mg total) by mouth daily. 90 tablet 2    ezetimibe (ZETIA) 10 mg tablet Take 1 tablet (10 mg total) by mouth daily. Not started yet      finasteride (PROSCAR) 5 mg tablet Take 1 tablet (5 mg total) by mouth daily. 90 tablet 1    fluoride, sodium, 1.1 % Gel Brush teeth with a pea-sized amount of the paste for 2 min and spit.  No rinsing. Do not eat or drink for 30 minutes after use. Use twice daily 56 g PRN    ipratropium-albuteroL (DUO-NEB) 0.5-2.5 mg/3 mL nebulizer Inhale 3 mL by nebulization every six (6) hours as needed (wheezing, shortness of breath). 270 mL 11    lancets (ACCU-CHEK SOFTCLIX LANCETS) Misc Use as instructed. Check blood sugar four times a day (before meals and at bedtime). 100 each 0    levETIRAcetam (KEPPRA) 500 MG tablet Take 1 tablet (500 mg total) by mouth two (2) times a day. 60 tablet 4    memantine (NAMENDA) 10 MG tablet Take 1 tablet (10 mg total) by mouth Two (2) times a day. 60 tablet 4    metFORMIN (GLUCOPHAGE) 500 MG tablet Take 2 tablets (1,000 mg total) by mouth in the morning and 2 tablets (1,000 mg total) in the evening. Take with meals. 360 tablet 1    mirtazapine (REMERON) 30 MG tablet Take 1  tablet (30 mg total) by mouth nightly. 90 tablet 1    multivitamin (TAB-A-VITE/THERAGRAN) per tablet Take 1 tablet by mouth daily.      nicotine (NICODERM CQ) 21 mg/24 hr patch Place 1 patch on the skin and change daily. 28 patch 0    ondansetron (ZOFRAN) 8 MG tablet Take 1 tablet (8 mg total) by mouth every eight (8) hours as needed for nausea.      pantoprazole (PROTONIX) 40 MG tablet Take 1 tablet (40 mg total) by mouth daily. 30 tablet 5    pen needle, diabetic (UNIFINE PENTIPS) 32 gauge x 1/4 (6 mm) Ndle Use as instructed. Check blood sugar four times a day (before meals and at bedtime). 100 each 0    senna-docusate (PERICOLACE) 8.6-50 mg Take 1 tablet by mouth nightly. 100 tablet 1    [START ON 01/29/2023] temozolomide (TEMODAR) 20 mg capsule Take 2 capsules (40 mg total) by mouth daily  with 1 other temozolomide prescription for 50 mg total. 56 capsule 0    [START ON 01/29/2023] temozolomide (TEMODAR) 5 mg capsule Take 2 capsules (10 mg total) by mouth daily  with 1 other temozolomide prescription for 50 mg total. 56 capsule 0    thiamine (B-1) 100 MG tablet Take 1 tablet (100 mg total) by mouth daily. 100 tablet 1    valACYclovir (VALTREX) 500 MG tablet Take 1 tablet (500 mg total) by mouth two (2) times a day. 60 tablet 11     No current facility-administered medications for this visit.       Allergies   Allergen Reactions    Sulfa (Sulfonamide Antibiotics) Rash     Developed during hospitalization d/c     Tegaderm Adhesive-No Drug-Allergy Check Other (See Comments)       Patient Active Problem List   Diagnosis    Primary CNS lymphoma (CMS-HCC)    Neutropenia (CMS-HCC)    Dental disease    COPD (chronic obstructive pulmonary disease) (CMS-HCC)    Diffuse large B cell lymphoma (CMS-HCC)    Anxiety and depression    BPH (benign prostatic hyperplasia)    Seasonal allergies    Insomnia    Poor short term memory    Hyperlipidemia    Tobacco use disorder    Diabetes mellitus (CMS-HCC)    History of chimeric antigen receptor T-cell therapy    Pancytopenia (CMS-HCC)    COVID-19    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    Appropriateness of Therapy     Acute infections noted within Epic:  COVID-19  Patient reported infection: None    Is medication and dose appropriate based on diagnosis and infection status? Yes    Prescription has been clinically reviewed: Yes    Baseline Quality of Life Assessment      How many days over the past month did your Primary CNS Lymphoma  keep you from your normal activities? For example, brushing your teeth or getting up in the morning. 0    Financial Information     Medication Assistance provided: Prior Authorization    Anticipated copay of $0 / 28 days  reviewed with patient. Verified delivery address.    Delivery Information     Scheduled delivery date: 01/29/23    Expected start date: 01/30/23 (starting same day as rituximab)  Medication will be delivered via Same Day Courier to the prescription address in [facility].  This shipment will not require a signature.      Explained the services we provide at [facility] and that each month we would call to set up refills.  Stressed importance of returning phone calls so that we could ensure they receive their medications in time each month.  Informed patient that we should be setting up refills 7-10 days prior to when they will run out of medication.  A pharmacist will reach out to perform a clinical assessment periodically.  Informed patient that a welcome packet, containing information about our pharmacy and other support services, a Notice of Privacy Practices, and a drug information handout will be sent.      The patient or caregiver noted above participated in the development of this care plan and knows that they can request review of or adjustments to the care plan at any time.      Patient or caregiver verbalized understanding of the above information as well as how to contact the pharmacy at [phone] option 4 with any questions/concerns.  The pharmacy is open Monday through Friday 8:30am-4:30pm.  A pharmacist is available 24/7 via pager to answer any clinical questions they may have.    Patient Specific Needs     Does the patient have any physical, cognitive, or cultural barriers? No    Does the patient have adequate living arrangements? (i.e. the ability to store and take their medication appropriately) Yes    Did you identify any home environmental safety or security hazards? No    Patient prefers to have medications discussed with   wife      Is the patient or caregiver able to read and understand education materials at a high school level or above? Yes    Patient's primary language is  English     Is the patient high risk? Yes, patient is taking oral chemotherapy. Appropriateness of therapy as been assessed    SOCIAL DETERMINANTS OF HEALTH     At the [facility], we have learned that life circumstances - like trouble affording food, housing, utilities, or transportation can affect the health of many of our patients.   That is why we wanted to ask: are you currently experiencing any life circumstances that are negatively impacting your health and/or quality of life? Patient declined to answer    Social Determinants of Health     Financial Resource Strain: Low Risk  (10/22/2022)    Overall Financial Resource Strain (CARDIA)     Difficulty of Paying Living Expenses: Not very hard   Internet Connectivity: Not on file   Food Insecurity: No Food Insecurity (10/22/2022)    Hunger Vital Sign     Worried About Running Out of Food in the Last Year: Never true     Ran Out of Food in the Last Year: Never true   Tobacco Use: Medium Risk (01/21/2023)    Patient History     Smoking Tobacco Use: Former     Smokeless Tobacco Use: Never     Passive Exposure: Not on file   Housing/Utilities: Low Risk  (10/22/2022)    Housing/Utilities     Within the past 12 months, have you ever stayed: outside, in a car, in a tent, in an overnight shelter, or temporarily in someone else's home (i.e. couch-surfing)?: No     Are you worried about losing your housing?: No  Within the past 12 months, have you been unable to get utilities (heat, electricity) when it was really needed?: No   Alcohol Use: Not At Risk (05/07/2022)    Alcohol Use     How often do you have a drink containing alcohol?: Never     How many drinks containing alcohol do you have on a typical day when you are drinking?: Not on file     How often do you have 5 or more drinks on one occasion?: Never   Transportation Needs: No Transportation Needs (10/22/2022)    PRAPARE - Transportation     Lack of Transportation (Medical): No     Lack of Transportation (Non-Medical): No   Substance Use: Low Risk  (05/07/2022)    Substance Use     Taken prescription drugs for non-medical reasons: Never     Taken illegal drugs: Never     Patient indicated they have taken drugs in the past year for non-medical reasons: Yes, [positive answer(s)]: Not on file   Health Literacy: Not on file   Physical Activity: Not on file   Interpersonal Safety: Not on file   Stress: Stress Concern Present (11/05/2020)    Received from [facility] - Occupational Stress Questionnaire     Feeling of Stress : Rather much   Intimate Partner Violence: Unknown (01/10/2022)    Received from [facility]    HITS     Physically Hurt: Not on file     Insult or Talk Down To: Not on file     Threaten Physical Harm: Not on file     Scream or Curse: Not on file   Depression: Not at risk (11/21/2022)    PHQ-2     PHQ-2 Score: 0   Social Connections: Unknown (02/18/2022)    Received from [employer]    Social Network     Social Network: Not on file     Would you be willing to receive help with any of the needs that you have identified today? Not applicable       Kermit Balo, [facility]  [facility] Specialty Pharmacist

## 2023-01-29 DIAGNOSIS — C8589 Other specified types of non-Hodgkin lymphoma, extranodal and solid organ sites: Principal | ICD-10-CM

## 2023-01-29 MED ORDER — TEMOZOLOMIDE 5 MG CAPSULE
ORAL_CAPSULE | Freq: Every day | ORAL | 0 refills | 28 days | Status: CP
Start: 2023-01-29 — End: ?

## 2023-01-29 MED ORDER — ZZ IMS TEMPLATE
Freq: Every day | ORAL | 0 refills | 28 days | Status: CN
Start: 2023-01-29 — End: ?

## 2023-01-29 MED ORDER — TEMOZOLOMIDE 20 MG CAPSULE
ORAL_CAPSULE | Freq: Every day | ORAL | 0 refills | 28 days | Status: CP
Start: 2023-01-29 — End: ?
  Filled 2023-01-29 (×2): qty 56, 28d supply, fill #0

## 2023-01-29 NOTE — Unmapped (Signed)
Remote Patient Monitoring

## 2023-01-29 NOTE — Unmapped (Signed)
Attempted to contact patient and wife to review Temodar administration and discuss antiemetic regimen plan at the request of [place] Miller, PharmD, BCOP, CPP. I was able to speak with wife briefly who confirmed knowledge on appropriate administration of Temodar. Wife confirmed that Temodar should be delivered by [facility] today to the patient. Clarified with patient's wife that Temodar is to be administered daily while the Rituximab will be administered weekly. Patient's wife voiced concerns and frustration and desiring to speak first with Dr. Elmon Kirschner before conversations are continued any further. Patient's wife ended call prematurely.     Team has been alerted of above conversation and request for infusion CPP to speak with patient and wife regarding regimen and antiemetic plan in person has been placed.    Katy Apo. Angelgabriel Willmore PharmD, BCOP, CPP  [place] Clinical Pharmacist Practitioner   Pager: [phone]

## 2023-01-30 ENCOUNTER — Institutional Professional Consult (permissible substitution): Admit: 2023-01-30 | Discharge: 2023-01-30 | Payer: PRIVATE HEALTH INSURANCE

## 2023-01-30 ENCOUNTER — Ambulatory Visit: Admit: 2023-01-30 | Discharge: 2023-01-30 | Payer: PRIVATE HEALTH INSURANCE

## 2023-01-30 ENCOUNTER — Encounter: Admit: 2023-01-30 | Discharge: 2023-01-30 | Payer: PRIVATE HEALTH INSURANCE

## 2023-01-30 DIAGNOSIS — C8589 Other specified types of non-Hodgkin lymphoma, extranodal and solid organ sites: Principal | ICD-10-CM

## 2023-01-30 LAB — CBC W/ AUTO DIFF
BASOPHILS ABSOLUTE COUNT: 0 10*9/L (ref 0.0–0.1)
BASOPHILS RELATIVE PERCENT: 0.1 %
EOSINOPHILS ABSOLUTE COUNT: 0 10*9/L (ref 0.0–0.5)
EOSINOPHILS RELATIVE PERCENT: 0.2 %
HEMATOCRIT: 35.3 % — ABNORMAL LOW (ref 39.0–48.0)
HEMOGLOBIN: 12.6 g/dL — ABNORMAL LOW (ref 12.9–16.5)
LYMPHOCYTES ABSOLUTE COUNT: 0.3 10*9/L — ABNORMAL LOW (ref 1.1–3.6)
LYMPHOCYTES RELATIVE PERCENT: 2.6 %
MEAN CORPUSCULAR HEMOGLOBIN CONC: 35.7 g/dL (ref 32.0–36.0)
MEAN CORPUSCULAR HEMOGLOBIN: 36.4 pg — ABNORMAL HIGH (ref 25.9–32.4)
MEAN CORPUSCULAR VOLUME: 101.9 fL — ABNORMAL HIGH (ref 77.6–95.7)
MEAN PLATELET VOLUME: 7.8 fL (ref 6.8–10.7)
MONOCYTES ABSOLUTE COUNT: 0.7 10*9/L (ref 0.3–0.8)
MONOCYTES RELATIVE PERCENT: 7 %
NEUTROPHILS ABSOLUTE COUNT: 9.2 10*9/L — ABNORMAL HIGH (ref 1.8–7.8)
NEUTROPHILS RELATIVE PERCENT: 90.1 %
PLATELET COUNT: 120 10*9/L — ABNORMAL LOW (ref 150–450)
RED BLOOD CELL COUNT: 3.46 10*12/L — ABNORMAL LOW (ref 4.26–5.60)
RED CELL DISTRIBUTION WIDTH: 19.4 % — ABNORMAL HIGH (ref 12.2–15.2)
WBC ADJUSTED: 10.2 10*9/L (ref 3.6–11.2)

## 2023-01-30 LAB — LYMPH MARKER LIMITED,FLOW
ABSOLUTE CD3 CNT: 189 {cells}/uL — ABNORMAL LOW (ref 915–3400)
ABSOLUTE CD4 CNT: 63 {cells}/uL — ABNORMAL LOW (ref 510–2320)
ABSOLUTE CD8 CNT: 126 {cells}/uL — ABNORMAL LOW (ref 180–1520)
CD3% (T CELLS): 63 % (ref 61–86)
CD4% (T HELPER): 21 % — ABNORMAL LOW (ref 34–58)
CD4:CD8 RATIO: 0.5 — ABNORMAL LOW (ref 0.9–4.8)
CD8% T SUPPRESR: 42 % — ABNORMAL HIGH (ref 12–38)

## 2023-01-30 MED ORDER — ONDANSETRON HCL 8 MG TABLET
ORAL_TABLET | Freq: Three times a day (TID) | ORAL | 6 refills | 20.00000 days | Status: CP | PRN
Start: 2023-01-30 — End: 2024-01-30
  Filled 2023-01-30: qty 60, 20d supply, fill #0

## 2023-01-30 MED ADMIN — acetaminophen (TYLENOL) tablet 650 mg: 650 mg | ORAL | @ 15:00:00 | Stop: 2023-01-30

## 2023-01-30 MED ADMIN — diphenhydrAMINE (BENADRYL) capsule 50 mg: 50 mg | ORAL | @ 15:00:00 | Stop: 2023-01-30

## 2023-01-30 MED ADMIN — riTUXimab-abbs (TRUXIMA) 1,515 mg in sodium chloride (NS) 0.9 % 640 mL IVPB: 750 mg/m2 | INTRAVENOUS | @ 17:00:00 | Stop: 2023-01-30

## 2023-01-30 MED FILL — THIAMINE HCL (VITAMIN B1) 100 MG TABLET: ORAL | 100 days supply | Qty: 100 | Fill #1

## 2023-01-30 NOTE — Unmapped (Signed)
RED ZONE Means: RED ZONE: Take action now!     You need to be seen right away  Symptoms are at a severe level of discomfort    Call 911 or go to your nearest  Hospital for help     - Bleeding that will not stop    - Hard to breathe    - New seizure - Chest pain  - Fall or passing out  -Thoughts of hurting    yourself or others      Call 911 if you are going into the RED ZONE                  YELLOW ZONE Means:     Please call with any new or worsening symptom(s), even if not on this list.  Call [phone]  After hours, weekends, and holidays - you will reach a long recording with specific instructions, If not in an emergency such as above, please listen closely all the way to the end and choose the option that relates to your need.   You can be seen by a provider the same day through our Same Day Acute Care for Patients with Cancer program.      YELLOW ZONE: Take action today     Symptoms are new or worsening  You are not within your goal range for:    - Pain    - Shortness of breath    - Bleeding (nose, urine, stool, wound)    - Feeling sick to your stomach and throwing up    - Mouth sores/pain in your mouth or throat    - Hard stool or very loose stools (increase in       ostomy output)    - No urine for 12 hours    - Feeding tube or other catheter/tube issue    - Redness or pain at previous IV or port/catheter site    - Depressed or anxiety   - Swelling (leg, arm, abdomen,     face, neck)  - Skin rash or skin changes  - Wound issues (redness, drainage,    re-opened)  - Confusion  - Vision changes  - Fever >100.4 F or chills  - Worsening cough with mucus that is    green, yellow, or bloody  - Pain or burning when going to the    bathroom  - Home Infusion Pump Issue- call    [phone]         Call your healthcare provider if you are going into the YELLOW ZONE     GREEN ZONE Means:  Your symptoms are under controls  Continue to take your medicine as ordered  Keep all visits to the provider GREEN ZONE: You are in control  No increase or worsening symptoms  Able to take your medicine  Able to drink and eat    - DO NOT use MyChart messages to report red or yellow symptoms. Allow up to 3    business days for a reply.  -MyChart is for non-urgent medication refills, scheduling requests, or other general questions.         [id number] Rev. 04/04/2022  Approved by Oncology Patient Education Committee

## 2023-01-30 NOTE — Unmapped (Signed)
1019 Patient educated on various topics, clinical references attached to patient's MyChart and AVS. Time spent 1 minutes.

## 2023-01-30 NOTE — Unmapped (Signed)
Patient arrived to Bed 1 for C1D1 Rituximab infusion. Initiated, tolerated without complication. At 1730 pt suffered a minor, witnessed fall in bathroom wherein his knees buckled and he slid backwards down the wall, landing on his rump without headstrike. VS taken, APP notified. Pt transported to bed where APP assessment took place. Remainder of rituximab infusion tolerated without complication. PIV dc'ed, no sign of infiltration.

## 2023-01-30 NOTE — Unmapped (Signed)
1000 Labs drawn peripherally and sent for analysis. Bandage applied after hemostasis.  1013 PIV # 22 placed with brisk blood return, anchored with tegaderm, line flushed. Patient tolerated this well.

## 2023-01-30 NOTE — Unmapped (Signed)
Lymphoma Clinic Follow-Up     Assessment/Plan  Diagnosis: primary CNS lymphoma, DLBCL diagnosed 06/03/20  Regimen: MATRIX (Methotrexate, cytarabine, thiotepa, rituximab): started 06/07/20-08/10/20 for 4 cycles --> pr-->single agent methotrexate x 3 cycles --> further decrease in size of masses   Not a transplant candidate due to poor PFTs and active smoking   Whole brain radiation 2340cGy given at 180cGy x 13 from 01/17/21-02/04/21 --> CR  Relapse - progression seen on routine MRI scan in new sites 08/15/2021 , seizure like activity noted 09/05/21 and keppra started  Regimen #2:  5 fractions of Cyberknife to involved areas (2500cGy) completed 09/06/21  Ibrutinib started 09/06/21--06/2022  MRI brain 11/06/21 - significantly improved   MRI 01/09/22 and 03/12/22: No residual enhancing lesions or disease progression; stable T2/flair- may represent posttreatment changes and superimposed chronic small vessel ischemic disease.    MRI 06/2022 -  ? Of new progression versus radiation necrosis.    MRI 07/10/22 -  Stable 0.8cm area of necrosis vs recurrent disase; New 0.4x0.4cm lesion in right parietal lobe Progressive disease  Regimen #3  07/25/22 lenalidomide obinutuzumab x 2 cycles --> PD 09/16/22 brain MRI  Regimen #4: 09/26/22 - CAR therapy as part of the  LCCC 1813-ATL: A Phase I Study of Autologous Activated T-Cells Targeting the CD19 Antigen and Containing the Inducible Caspase 9 Safety Switch in Subjects with Relapsed/Refractory B-cell Lymphoma and Chronic Lymphocytic Leukemia/ Small Lymphocytic Lymphoma, IRB# [id number] after lymphodepletion with flu/cy    Date of Lymphodepletion: Fludarabine 30 mg/m2 and Bendamustine 70mg /m2 mg/m2 x 3 days (09/19/22-09/22/22)   Current Day from Cell Infusion: Day +70  (09/26/2022 = D0)  Cytokine Release Syndrome: Current Grade: 0. Max Grade: 1  ICANS: Current Grade: 0. Max Grade: 1  Treatment was further complicated by recurrent covid and aspiration pneumonia.  Response: MRI 12/04/22 shows all the lesions have decreased slightly in size  Progression of disease on MRI 01/19/23 w/ significant edema  Regimen # 5: 01/30/23 - rituxiamb and low dose daily temodar     Interval History  HE was admitted to the hospital earlier this month with hypoxia attributed to covid again as well as weakness/falls and MRI that showed probression of his lymphoma. I have personally reviewed this MRI form 4/26 and there is a lot of edema around the lesions. He was treated with dex 8mg  bid and had some improvement and the was discharged home with plans to taper steroids.     When he went down to 4mg  bid his left side got weak again and he really couldn't get out of the chair.  He couldn't do anything with his left arm or leg. So, he increased back to 8mg  bid on Wednesday and now he is much stronger on his left side.  He can walk with a cane.  HE can now get up and make himself a cup of coffee unassisted. No confusion. No SOB or cough.  No falls.     Blood sugars have been around 119 so not too high.     He has been really sleepy and he sleeps during day and he is up at night.         Hematology/Oncology History   Primary CNS lymphoma (CMS-HCC)   11/28/2020 Initial Diagnosis    Primary CNS lymphoma (CMS-HCC)     11/30/2020 - 11/30/2020 Chemotherapy    IP LYMPHOMA HIGH-DOSE METHOTREXATE  riTUXimab 375 mg/m2 (standard and rapid infusion) IV on day 1, vinCRIStine 1.4 mg/m2 (max 2 mg) IV on day  1, DOXOrubicin 50 mg/m2 IV on day 1, cyclophosphamide 750 mg/m2 IV on day 1, predniSONE 100 mg PO on days 1-5, methotrexate (high-dose) IV on Day 15 (even cycles), every 21-day.     01/03/2021 - 09/15/2022 Radiation    Radiation Therapy Treatment Details (01/03/2021 - 09/15/2022)  Site: Midline Brain  Technique: 3D CRT  Goal: No goal specified  Planned Treatment Start Date: No planned start date specified     08/23/2021 - 09/15/2022 Radiation    Radiation Therapy Treatment Details (08/23/2021 - 09/15/2022)  Site: Bilateral Brain  Technique: SRS  Goal: No goal specified  Planned Treatment Start Date: No planned start date specified     08/01/2022 - 08/25/2022 Chemotherapy    OP LYMPHOMA LENALIDOMIDE +/- RITUXIMAB +/- MAINTENANCE  Lenalidomide 20 mg PO on days 1 to 21, riTUXimab 375 mg/m2 IV on days 1, 8, 15, 22 of Cycle 1, then 375 mg/m2 IV on day 1 monthly or bimonthly for 1-2 years, every 28 days     09/17/2022 -  Chemotherapy    STUDY [id number] IRB# [id number] OP LYMPHODEPLETION (FLU/BENDA) + OP IC9-CAR19 INFUSION (v. 08/06/20)  A Phase I Study of Autologous Activated T-cells Targeting the CD19 Antigen and Containing the Inducible Caspase 9 Safety Switch in Subjects with Relapsed/Refractory B-cell Lymphoma and Chronic Lymphocytic Leukemia/Small Lymphocytic Lymphoma        Past Medical History:   Diagnosis Date    Anxiety     Cancer (CMS-HCC)     COPD (chronic obstructive pulmonary disease) (CMS-HCC)     Cytarabine poisoning     Depression     Diabetes mellitus (CMS-HCC)     Pneumonia, aspiration (CMS-HCC)     Prostate atrophy        Vitals: Baseline BP:  Vitals:    01/30/23 1006 01/30/23 1016   BP:  122/85   Pulse:  73   Resp:  24   Temp:  36.8 ??C (98.2 ??F)   TempSrc:  Oral   SpO2:  97%   Weight: 78.5 kg (173 lb 1 oz)    Height: 185.4 cm (6' 1)        Wt Readings from Last 6 Encounters:   01/30/23 78.5 kg (173 lb 1 oz)   01/21/23 78.9 kg (174 lb)   01/16/23 78.9 kg (174 lb)   12/26/22 78 kg (171 lb 15.3 oz)   12/24/22 76.6 kg (168 lb 12.8 oz)   12/17/22 75.5 kg (166 lb 6.4 oz)        Physical Exam:   General :  No acute distress noted.   Cardiovascular: normal rate, regularity and rhythm. S1 and S2 normal, without any murmur, rub, or gallop.  Lungs: Mild expiratory wheezing bilaterally.   Skin: Warm, dry, intact. No rash noted.   Gastrointestinal:Abdomen soft, non-tender   Musculoskeletal/Extremities: No edema.   Neurologic:- he stands up and moves to the examining table unassisted  but seems to ave a little bit of trouble cocrdinating the lfet leg. On exam strength is 5/5 in all 4 extremities.  EOM not intact - they don't look past midline when looking to the right. Visual fields intact. Oreinted to place, date, and why he is here      Lab Results   Component Value Date    WBC 10.2 01/30/2023    HGB 12.6 (L) 01/30/2023    HCT 35.3 (L) 01/30/2023    PLT 120 (L) 01/30/2023     Lab Results   Component Value Date  NA 140 01/22/2023    K 4.4 01/22/2023    CL 108 (H) 01/22/2023    CO2 25.0 01/22/2023    BUN 14 01/22/2023    CREATININE 0.54 (L) 01/22/2023    GLU 162 01/22/2023    CALCIUM 9.2 01/22/2023    MG 2.0 01/22/2023    PHOS 4.3 01/19/2023     Lab Results   Component Value Date    BILITOT 0.4 01/22/2023    BILIDIR 0.10 01/22/2023    PROT 6.7 01/22/2023    ALBUMIN 3.6 01/22/2023    ALT 17 01/22/2023    AST 16 01/22/2023    ALKPHOS 62 01/22/2023     Lab Results   Component Value Date    INR 0.98 01/19/2023    APTT 27.5 11/03/2022           Assessment and Plan:  Andre Duncan is a 57 y.o. enrolled on LCCC 1813-ATL. 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 He received CAR T-cell infusion (09/26/22) with dose Level 2: 2 x 10^6 cells/kg.      Disease: Primary CNS Lymphoma. He has received mutotiple lines of therapy, most recently CART on 09/26/22. Unfortunately, imaging from a few weeks ago showed progression of disease.  I had a lont talk with him today about prognosis and the chances that the next treamtent won't work and there aren't great alternative treatment options after that. He understands how poor the prognosis is, but since he is doing ok right now he wants to proceed with treatment.  We did make him DNR/I.      We will start treatment with temodar/rituximab today. After discussion with members of the brain malignancy group, we are giving him atypical dosing with a lower dose given daily rather than a larger dose intermittently each month. We are starting at an even lower dose because he has recently had significant cytopenias and even though they are getting better now, thrombocytopenia in particular is a frequent side effect so I will still start at this lower dose but may increase with next cycle if tolerated well. The studies gave a higher dose of rituxiamb (to cross blood brain barrier) weekly for the first 2 cycles so we will follow thiw plan.      Just looking at temodar alone, without rituximab, a phase II trial of Twenty-three patients with a Median age was 47 year showed Five complete remissions (median duration 6+ months; range 2-36 months), one partial response, four stable disease (median duration 7.2 months, range 2-16.5 months), and 13 progressions were observed. No major toxicities were observed, apart grade 3 vomiting in a single cycle. Main grade 1-2 toxicities were: 15% nausea, 6% vomiting, 9% fatigue and 9% neurological symptoms. This is the first prospective trial assessing single-agent activity in PCNSL at failure. Although some patients had a poor PS and had been heavily pre-treated, temozolomide yielded 26% objective responses and was well tolerated without any major toxicity. Reni et al. Eur J Cancer 2004 Jul;40(11):1682-8     Plan to repeat MRI after 2 cycles.    For the steroids, keep at 8mg  bid for the moment and in a few weeks we wiill try to start slowly aptering down.       ID:   COVID-19 + on 09/24/22, and 10/17/2022 and 10/31/22 and 01/2023 - treated again during last hospital stay.      Prophylaxis:   SMZ/TMP continuing through 1 year after CART and also now bc of steroids.  Consider extending  if CD4 remains < 200 at 12 months For now we are using pentamadine since he is still thrombocytopenia.    Pentamidine given on 4/16. WiIll need this monthly. Next due around 5/22  -Antiviral: Valacyclovir 500 mg po BID until 12 months post CAR-T infusion and CD4+ >200.     CD4 counts:  -Start checking CD4 counts monthly starting at 3 months post CAR-T infusion then at 6, 12, 18 and 24 months. If <200 will need to extend PJP and antiviral prophylaxis.   -cd4 count today     Hem:   thrombocytopenia -  This can be prolonged after CART therapy but I expect he will eventually recover as time passes.  IT has improved voer the past week but may drop again with the temodar. Repeat CBC at next visit.     GI:   GERD:   pantoprazole.     Left inguinal hernia:   - He was coughing overnight on 09/24/22 and sounds like he developed an incarcerated hernia with possible strangulation c/b nausea/vomiting. He was advised to go to [facility] for emergent surgical evaluation. The hernia was able to be reduced in the ED.  -no active herniation at this time.     CV:   - Hyperlipidemia- Patient met with his PCP on 09/15/22, who repeated his lipid panel. Patient reportedly had stopped taking his prescribed statin due to leg cramps. His PCP Ali Lowe) gave a prescription for ezetimibe (Zetia). He is going to discusst his further with his PCP.     Pulm:   - COPD: The patient and his wife report that he was diagnosed in 2022, but there was documentation of COPD at the time of original diagnosis in Aug, 2021. He is a long time smoker.  PFT's in March, 2022, showed reduced DLCO. Continue inhalers.  Stopped smoking when he was admitted for his CAR-T infusion but he has started again.       Endo:   - Diabetes mellitus: Diagnosed in 2022; patient is managing with metformin presently.   - 10/17/22: SSI started on admission  - 10/19/22: Added 5u Lantus  - 10/20/22: Restarted Metformin 1000mg  BID  - 10/22/22: Continue Metformin 1000 mg BID on discharge, increased Lantus to 15U nightly. Continue SSI.   -10/30/22: Blood glucose decreasing as we decrease dex, Lantus to 12 units nightly, continue metformin.   -12/26/22 - management as per his PCP     Clarene Critchley, MD  Associate Professor  Division of Hematology

## 2023-01-30 NOTE — Unmapped (Signed)
Pharmacy: First Cycle Chemotherapy Patient Education    Chemotherapy regimen/agents: rituximab weekly with daily temozolomide  Venous Access: PIV  Caregivers present for education: spouse    Andre Duncan is a 57 y.o. with primary CNS lymphoma. Chemotherapy education was provided to the patient by the oncology pharmacy team.    Side effects discussed included but were not limited to:   infusion-related reactions, extravasation, complications associated with myelosuppresion (such as infection/fever, fatigue, and bleeding), nausea/vomiting, mucositis, flu-like symptoms, hair loss, or fatigue.    Patient and wife aware he should take temozolomide on an empty stomach. He will take ondansetron 8 mg 30 minutes before temozolomide.    The patient handout from [facility] or the Hematology/Oncology Fellow on-call phone number for concerns after hours were given.The patient verbalized understanding of this information.  Medication reconciliation was completed and the medication list was updated in EPIC.      Approximate time spent with patient: 20  Minutes.    Kennon Holter, PharmD, CPP

## 2023-01-31 NOTE — Unmapped (Signed)
Brief Adult Oncology Infusion Clinic Note:    Paged regarding: Fall    Mr. Mischel is in the infusion clinic for C1D1 Lymphoma Rituximab/Temozolomide therapy. According to nursing staff patient had finished using the restroom stood up from the toilet, loss his balance and slid down to the floor against the bathroom door. The event was witnessed by the infusion nurse. Patient and staff confirm he did not hit his head. There was no LOC. Patient denies pain or bodily injury. He was assisted back to the wheelchair by nursing staff. No obvious bleeding or bruising noted on exam. He is seated in the wheelchair in NAD in his room. VSS. Caregiver present and states patient has fallen before at home due to him moving too quickly and not waiting for assistance. Patient and caregiver will notify healthcare provider for any concerns post discharge.           Montel Culver, ANP  ADVANCED PRACTICE PROVIDER FOR INFUSION PROGRAM

## 2023-02-02 ENCOUNTER — Encounter: Admit: 2023-02-02 | Payer: PRIVATE HEALTH INSURANCE

## 2023-02-03 MED ORDER — DEXAMETHASONE 4 MG TABLET
ORAL_TABLET | Freq: Two times a day (BID) | ORAL | 0 refills | 30 days | Status: CP
Start: 2023-02-03 — End: 2023-03-05

## 2023-02-04 NOTE — Unmapped (Signed)
Call placed to patient/cg to discuss the [facility] and Remote Patient Monitoring (RPM) services.  Pt and/or cg advised of purpose of RPM, days/hours of monitoring, equipment to be delivered to home, method of delivery and the patient's responsibility and expectation. Pt does/does not consent to RPM services.Patient declined did not want

## 2023-02-06 ENCOUNTER — Ambulatory Visit: Admit: 2023-02-06 | Discharge: 2023-02-07 | Payer: PRIVATE HEALTH INSURANCE

## 2023-02-06 LAB — CBC W/ AUTO DIFF
BASOPHILS ABSOLUTE COUNT: 0 10*9/L (ref 0.0–0.1)
BASOPHILS RELATIVE PERCENT: 0.2 %
EOSINOPHILS ABSOLUTE COUNT: 0 10*9/L (ref 0.0–0.5)
EOSINOPHILS RELATIVE PERCENT: 0 %
HEMATOCRIT: 37 % — ABNORMAL LOW (ref 39.0–48.0)
HEMOGLOBIN: 12.8 g/dL — ABNORMAL LOW (ref 12.9–16.5)
LYMPHOCYTES ABSOLUTE COUNT: 0.3 10*9/L — ABNORMAL LOW (ref 1.1–3.6)
LYMPHOCYTES RELATIVE PERCENT: 3.7 %
MEAN CORPUSCULAR HEMOGLOBIN CONC: 34.6 g/dL (ref 32.0–36.0)
MEAN CORPUSCULAR HEMOGLOBIN: 36.9 pg — ABNORMAL HIGH (ref 25.9–32.4)
MEAN CORPUSCULAR VOLUME: 106.6 fL — ABNORMAL HIGH (ref 77.6–95.7)
MEAN PLATELET VOLUME: 7.6 fL (ref 6.8–10.7)
MONOCYTES ABSOLUTE COUNT: 0.5 10*9/L (ref 0.3–0.8)
MONOCYTES RELATIVE PERCENT: 5.9 %
NEUTROPHILS ABSOLUTE COUNT: 7.9 10*9/L — ABNORMAL HIGH (ref 1.8–7.8)
NEUTROPHILS RELATIVE PERCENT: 90.2 %
PLATELET COUNT: 86 10*9/L — ABNORMAL LOW (ref 150–450)
RED BLOOD CELL COUNT: 3.47 10*12/L — ABNORMAL LOW (ref 4.26–5.60)
RED CELL DISTRIBUTION WIDTH: 19.3 % — ABNORMAL HIGH (ref 12.2–15.2)
WBC ADJUSTED: 8.8 10*9/L (ref 3.6–11.2)

## 2023-02-06 LAB — COMPREHENSIVE METABOLIC PANEL
ALBUMIN: 3.6 g/dL (ref 3.4–5.0)
ALKALINE PHOSPHATASE: 60 U/L (ref 46–116)
ALT (SGPT): 35 U/L (ref 10–49)
ANION GAP: 13 mmol/L (ref 5–14)
AST (SGOT): 32 U/L (ref <=34)
BILIRUBIN TOTAL: 0.5 mg/dL (ref 0.3–1.2)
BLOOD UREA NITROGEN: 27 mg/dL — ABNORMAL HIGH (ref 9–23)
BUN / CREAT RATIO: 48
CALCIUM: 9.7 mg/dL (ref 8.7–10.4)
CHLORIDE: 101 mmol/L (ref 98–107)
CO2: 20 mmol/L (ref 20.0–31.0)
CREATININE: 0.56 mg/dL — ABNORMAL LOW
EGFR CKD-EPI (2021) MALE: >90 mL/min/{1.73_m2} (ref >=60)
GLUCOSE RANDOM: 381 mg/dL — ABNORMAL HIGH (ref 70–179)
POTASSIUM: 4.4 mmol/L (ref 3.4–4.8)
PROTEIN TOTAL: 6.6 g/dL (ref 5.7–8.2)
SODIUM: 134 mmol/L — ABNORMAL LOW (ref 135–145)

## 2023-02-06 MED ORDER — BENZONATATE 200 MG CAPSULE
ORAL_CAPSULE | Freq: Three times a day (TID) | ORAL | 0 refills | 7.00000 days | Status: CP | PRN
Start: 2023-02-06 — End: 2023-02-13

## 2023-02-06 MED ORDER — ALBUTEROL SULFATE HFA 90 MCG/ACTUATION AEROSOL INHALER
Freq: Four times a day (QID) | RESPIRATORY_TRACT | 3 refills | 0.00000 days | Status: CP | PRN
Start: 2023-02-06 — End: ?

## 2023-02-06 MED ORDER — LEVOFLOXACIN 750 MG TABLET
ORAL_TABLET | ORAL | 0 refills | 7.00000 days | Status: CP
Start: 2023-02-06 — End: 2023-02-13

## 2023-02-06 MED ADMIN — ipratropium (ATROVENT) 0.02 % nebulizer solution 500 mcg: 500 ug | RESPIRATORY_TRACT | @ 16:00:00

## 2023-02-06 MED ADMIN — albuterol 2.5 mg /3 mL (0.083 %) nebulizer solution 2.5 mg: 2.5 mg | RESPIRATORY_TRACT | @ 16:00:00

## 2023-02-06 NOTE — Unmapped (Signed)
Infusion Center Progress Note    Patient Name: Andre Duncan  Patient Age: 57 y.o.  Encounter Date: 02/06/2023    Reason for visit  Chief Complaint: Increased SOB, cough and weakness  Today is cycle 1/ day 8 of Rituximab/Temozolomide    Assessment/Plan:  I have discussed the case (exam, laboratory results, and plan) with patient and team are all in agreement with plan outlined below.    Plan:  Chest X-ray  Possible need for antibiotics-Care team now at bedside    Subjective/HPI:  Andre Duncan is a 57 y.o. male patient with history of PCNS lymphoma s/p chemoradiation now on trial anti-CD-19+ CART-T (Day 0 = 09/26/22), hx seizure (on Keppra), COPD, BPH, T2DM, who recently presented in the ED with generalized weakness and recurrent falls, found to have hypoxemia, persistent COVID infection, and worsening of his lymphoma.     Here today with worsening SOB, increased respirations, and cough. Over all Andre Duncan states Andre Duncan feels OK just becomes fatigued and falls. Andre Duncan is coughing up green phlegm. Denies fever or chills. Thinks that it is getting a little worse. Andre Duncan admits to taking his home medications as prescribed. Heard rhonchi and wheezing bilaterally on auscultation of lungs. Increased HR and RR. Labs pending.     Review of Systems:  Review of Systems - Oncology    Oncology History:  Hematology/Oncology History   Primary CNS lymphoma (CMS-HCC)   11/28/2020 Initial Diagnosis    Primary CNS lymphoma (CMS-HCC)     11/30/2020 - 11/30/2020 Chemotherapy    IP LYMPHOMA HIGH-DOSE METHOTREXATE  riTUXimab 375 mg/m2 (standard and rapid infusion) IV on day 1, vinCRIStine 1.4 mg/m2 (max 2 mg) IV on day 1, DOXOrubicin 50 mg/m2 IV on day 1, cyclophosphamide 750 mg/m2 IV on day 1, predniSONE 100 mg PO on days 1-5, methotrexate (high-dose) IV on Day 15 (even cycles), every 21-day.     01/03/2021 - 09/15/2022 Radiation    Radiation Therapy Treatment Details (01/03/2021 - 09/15/2022)  Site: Midline Brain  Technique: 3D CRT  Goal: No goal specified  Planned Treatment Start Date: No planned start date specified     08/23/2021 - 09/15/2022 Radiation    Radiation Therapy Treatment Details (08/23/2021 - 09/15/2022)  Site: Bilateral Brain  Technique: SRS  Goal: No goal specified  Planned Treatment Start Date: No planned start date specified     08/01/2022 - 08/25/2022 Chemotherapy    OP LYMPHOMA LENALIDOMIDE +/- RITUXIMAB +/- MAINTENANCE  Lenalidomide 20 mg PO on days 1 to 21, riTUXimab 375 mg/m2 IV on days 1, 8, 15, 22 of Cycle 1, then 375 mg/m2 IV on day 1 monthly or bimonthly for 1-2 years, every 28 days     09/17/2022 -  Chemotherapy    STUDY [id number] IRB# [id number] OP LYMPHODEPLETION (FLU/BENDA) + OP IC9-CAR19 INFUSION (v. 08/06/20)  A Phase I Study of Autologous Activated T-cells Targeting the CD19 Antigen and Containing the Inducible Caspase 9 Safety Switch in Subjects with Relapsed/Refractory B-cell Lymphoma and Chronic Lymphocytic Leukemia/Small Lymphocytic Lymphoma          Allergies:  Allergies   Allergen Reactions    Sulfa (Sulfonamide Antibiotics) Rash     Developed during hospitalization d/c     Tegaderm Adhesive-No Drug-Allergy Check Other (See Comments)       Medications:  Reviewed in the EMR    Physical Exam:  BP 122/76  - Pulse 108  - Temp 36.7 ??C (98.1 ??F) (Oral)  - Resp 20  - SpO2 96%  Physical Exam  Constitutional:       Appearance: Normal appearance.   HENT:      Head: Normocephalic and atraumatic.   Cardiovascular:      Rate and Rhythm: Tachycardia present.      Pulses: Normal pulses.   Pulmonary:      Breath sounds: Wheezing and rhonchi present.   Neurological:      General: No focal deficit present.      Mental Status: Andre Duncan is alert and oriented to person, place, and time.           Results:  Hospital Outpatient Visit on 02/06/2023   Component Date Value Ref Range Status    WBC 02/06/2023 8.8  3.6 - 11.2 10*9/L Final    RBC 02/06/2023 3.47 (L)  4.26 - 5.60 10*12/L Final    HGB 02/06/2023 12.8 (L)  12.9 - 16.5 g/dL Final    HCT 09/81/1914 37.0 (L)  39.0 - 48.0 % Final    MCV 02/06/2023 106.6 (H)  77.6 - 95.7 fL Final    MCH 02/06/2023 36.9 (H)  25.9 - 32.4 pg Final    MCHC 02/06/2023 34.6  32.0 - 36.0 g/dL Final    RDW 78/29/5621 19.3 (H)  12.2 - 15.2 % Final    MPV 02/06/2023 7.6  6.8 - 10.7 fL Final    Platelet 02/06/2023 86 (L)  150 - 450 10*9/L Final    Neutrophils % 02/06/2023 90.2  % Final    Lymphocytes % 02/06/2023 3.7  % Final    Monocytes % 02/06/2023 5.9  % Final    Eosinophils % 02/06/2023 0.0  % Final    Basophils % 02/06/2023 0.2  % Final    Absolute Neutrophils 02/06/2023 7.9 (H)  1.8 - 7.8 10*9/L Final    Absolute Lymphocytes 02/06/2023 0.3 (L)  1.1 - 3.6 10*9/L Final    Absolute Monocytes 02/06/2023 0.5  0.3 - 0.8 10*9/L Final    Absolute Eosinophils 02/06/2023 0.0  0.0 - 0.5 10*9/L Final    Absolute Basophils 02/06/2023 0.0  0.0 - 0.1 10*9/L Final    Macrocytosis 02/06/2023 Slight (A)  Not Present Final    Anisocytosis 02/06/2023 Moderate (A)  Not Present Final       I spent a total of 20 minutes in the care of this patient.     Yolande Jolly, AGNP-BC  [facility]

## 2023-02-06 NOTE — Unmapped (Signed)
Patient arrived to bed 51 for tx today, noted to be SOB on light exertion, breathing heavily when still, SpO2 96% on RA,HR 108-116. Notified patient's team as well as Melissa NP. Portable CXR was performed, patient received neb tx, and then discharged to home/self care with tx deferred today. Tina NA escorted patient back to car via wheelchair.

## 2023-02-07 NOTE — Unmapped (Signed)
Encounter addended by: Laurice Record, FNP on: 02/06/2023 5:25 PM   Actions taken: Clinical Note Signed

## 2023-02-07 NOTE — Unmapped (Signed)
Patient seen in infusion due to increased SOB, cough, weakness, falls as requested by infusion team.    Patient is visibly SOB and with non-prod congested cough.  He states he is more SOB than last week and has been falling at home.  He has not hit his head. He states he has some night sweats but no fevers and chills.  Coughing keeping him up at night.   He is eating well.   He is adamant about not being admitted to the hospital today    Physical Exam:  GENERAL: frail, fatigued, chronically ill appearing  HEENT: anicteric sclera, clear conjunctiva, MMM, no oral lesions or oropharyngeal exudate  LYMPH: No palpable cervical, supraclavicular, axillary, or inguinal nodes  RESP: Labored breathing at rest, worsens with minimal activity, is able to complete full sentences rather comfortably. Breath sounds: Wheezing and rhonchi present diffusely with scattered fine crackles. On RA.  CARDIO: RRR, normal S1,S2; no RMGs  GI: abdomen is soft, round, active bowel sounds, nontender, no palpable masses  EXTREMITIES: well perfused, warm, no peripheral edema  NEURO: Alert and oriented x 3, with appropriate conversation  PSYCH: appropriate  MSK: No grossly-evident joint effusions or deformities. Range of motion in shoulders, elbows, hips and knees is grossly normal.  DERM: warm, dry, no rashes or lesions     A/P:    PNA - COPD exacerbation - Cough:   - xray: Bibasilar, left greater than right, airspace disease is favored to be atelectasis and scarring. Per portable imaging, there is no consolidation to suggest active pneumonia or recent aspiration. However, either could be a contributing factor to the persistent bibasilar findings.   - LRC  - duo nebs   - continue current steroid regimen  - Levaquin 750mg  daily x 7 days (last qtc WNL)  - Tessalon pearls  - Albuterol inhaler PRN  - Discussed strict call precautions/when to call 911 or present to ER    Hem Malignancy:  - will hold Rituximab infusion today, see again next week in infusion for ritux infusion next friday  - Continue daily temozolomide WBC otherwise okay, not neutropenic    Plan discussed with Dr Elmon Kirschner    Cruzita Lederer, MSN, FNP-BC, [facility]  Nurse Practitioner  [facility] Hematology/Oncology

## 2023-02-13 ENCOUNTER — Ambulatory Visit: Admit: 2023-02-13 | Discharge: 2023-02-13 | Payer: PRIVATE HEALTH INSURANCE

## 2023-02-13 ENCOUNTER — Other Ambulatory Visit: Admit: 2023-02-13 | Discharge: 2023-02-13 | Payer: PRIVATE HEALTH INSURANCE

## 2023-02-13 LAB — COMPREHENSIVE METABOLIC PANEL
ALBUMIN: 3.8 g/dL (ref 3.4–5.0)
ALKALINE PHOSPHATASE: 79 U/L (ref 46–116)
ALT (SGPT): 33 U/L (ref 10–49)
ANION GAP: 12 mmol/L (ref 5–14)
AST (SGOT): 29 U/L (ref <=34)
BILIRUBIN TOTAL: 0.4 mg/dL (ref 0.3–1.2)
BLOOD UREA NITROGEN: 18 mg/dL (ref 9–23)
BUN / CREAT RATIO: 33
CALCIUM: 9.6 mg/dL (ref 8.7–10.4)
CHLORIDE: 97 mmol/L — ABNORMAL LOW (ref 98–107)
CO2: 23 mmol/L (ref 20.0–31.0)
CREATININE: 0.54 mg/dL — ABNORMAL LOW
EGFR CKD-EPI (2021) MALE: >90 mL/min/{1.73_m2} (ref >=60)
GLUCOSE RANDOM: 453 mg/dL (ref 70–179)
POTASSIUM: 4.5 mmol/L (ref 3.4–4.8)
PROTEIN TOTAL: 7.4 g/dL (ref 5.7–8.2)
SODIUM: 132 mmol/L — ABNORMAL LOW (ref 135–145)

## 2023-02-13 LAB — CBC W/ AUTO DIFF
BASOPHILS ABSOLUTE COUNT: 0 10*9/L (ref 0.0–0.1)
BASOPHILS RELATIVE PERCENT: 0.2 %
EOSINOPHILS ABSOLUTE COUNT: 0 10*9/L (ref 0.0–0.5)
EOSINOPHILS RELATIVE PERCENT: 0 %
HEMATOCRIT: 43.7 % (ref 39.0–48.0)
HEMOGLOBIN: 14.8 g/dL (ref 12.9–16.5)
LYMPHOCYTES ABSOLUTE COUNT: 0.2 10*9/L — ABNORMAL LOW (ref 1.1–3.6)
LYMPHOCYTES RELATIVE PERCENT: 1.5 %
MEAN CORPUSCULAR HEMOGLOBIN CONC: 33.7 g/dL (ref 32.0–36.0)
MEAN CORPUSCULAR HEMOGLOBIN: 35.9 pg — ABNORMAL HIGH (ref 25.9–32.4)
MEAN CORPUSCULAR VOLUME: 106.4 fL — ABNORMAL HIGH (ref 77.6–95.7)
MEAN PLATELET VOLUME: 8.6 fL (ref 6.8–10.7)
MONOCYTES ABSOLUTE COUNT: 0.6 10*9/L (ref 0.3–0.8)
MONOCYTES RELATIVE PERCENT: 4.6 %
NEUTROPHILS ABSOLUTE COUNT: 11.5 10*9/L — ABNORMAL HIGH (ref 1.8–7.8)
NEUTROPHILS RELATIVE PERCENT: 93.7 %
PLATELET COUNT: 69 10*9/L — ABNORMAL LOW (ref 150–450)
RED BLOOD CELL COUNT: 4.11 10*12/L — ABNORMAL LOW (ref 4.26–5.60)
RED CELL DISTRIBUTION WIDTH: 18.9 % — ABNORMAL HIGH (ref 12.2–15.2)
WBC ADJUSTED: 12.2 10*9/L — ABNORMAL HIGH (ref 3.6–11.2)

## 2023-02-13 LAB — SLIDE REVIEW

## 2023-02-13 MED ADMIN — acetaminophen (TYLENOL) tablet 650 mg: 650 mg | ORAL | @ 19:00:00 | Stop: 2023-02-13

## 2023-02-13 MED ADMIN — diphenhydrAMINE (BENADRYL) capsule 50 mg: 50 mg | ORAL | @ 19:00:00 | Stop: 2023-02-13

## 2023-02-13 MED ADMIN — riTUXimab-abbs (TRUXIMA) 1,515 mg in sodium chloride (NS) 0.9 % 600 mL IVPB: 750 mg/m2 | INTRAVENOUS | @ 20:00:00 | Stop: 2023-02-13

## 2023-02-13 MED ADMIN — sodium chloride (NS) 0.9 % infusion: 100 mL/h | INTRAVENOUS | @ 19:00:00

## 2023-02-13 MED ADMIN — insulin regular (HumuLIN,NovoLIN) injection 10 Units: 10 [IU] | SUBCUTANEOUS | @ 20:00:00 | Stop: 2023-02-13

## 2023-02-13 NOTE — Unmapped (Signed)
Patient arrived to chair 1 for C1 D15 of Rixuximab treatment, PIV intact with +BR, treatment perimeters reviewed and premedicated. Serum blood glucose 453, 10 units of regular insulin given, per MD order. Treatment tolerating well, AVS printed. Duration of treatment endorsed to [facility], patient moved to chair 48.

## 2023-02-13 NOTE — Unmapped (Signed)
Lab on 02/13/2023   Component Date Value Ref Range Status    Sodium 02/13/2023 132 (L)  135 - 145 mmol/L Final    Potassium 02/13/2023 4.5  3.4 - 4.8 mmol/L Final    Chloride 02/13/2023 97 (L)  98 - 107 mmol/L Final    CO2 02/13/2023 23.0  20.0 - 31.0 mmol/L Final    Anion Gap 02/13/2023 12  5 - 14 mmol/L Final    BUN 02/13/2023 18  9 - 23 mg/dL Final    Creatinine 60/45/4098 0.54 (L)  0.73 - 1.18 mg/dL Final    BUN/Creatinine Ratio 02/13/2023 33   Final    eGFR CKD-EPI (2021) Male 02/13/2023 >90  >=60 mL/min/1.34m2 Final    eGFR calculated with CKD-EPI 2021 equation in accordance with [employer] and [employer] of Nephrology Task Force recommendations.    Glucose 02/13/2023 453 (HH)  70 - 179 mg/dL Final    Calcium 11/91/4782 9.6  8.7 - 10.4 mg/dL Final    Albumin 95/62/1308 3.8  3.4 - 5.0 g/dL Final    Total Protein 02/13/2023 7.4  5.7 - 8.2 g/dL Final    Total Bilirubin 02/13/2023 0.4  0.3 - 1.2 mg/dL Final    AST 65/78/4696 29  <=34 U/L Final    ALT 02/13/2023 33  10 - 49 U/L Final    Alkaline Phosphatase 02/13/2023 79  46 - 116 U/L Final    WBC 02/13/2023 12.2 (H)  3.6 - 11.2 10*9/L Final    RBC 02/13/2023 4.11 (L)  4.26 - 5.60 10*12/L Final    HGB 02/13/2023 14.8  12.9 - 16.5 g/dL Final    HCT 29/52/8413 43.7  39.0 - 48.0 % Final    MCV 02/13/2023 106.4 (H)  77.6 - 95.7 fL Final    MCH 02/13/2023 35.9 (H)  25.9 - 32.4 pg Final    MCHC 02/13/2023 33.7  32.0 - 36.0 g/dL Final    RDW 24/40/1027 18.9 (H)  12.2 - 15.2 % Final    MPV 02/13/2023 8.6  6.8 - 10.7 fL Final    Platelet 02/13/2023 69 (L)  150 - 450 10*9/L Final    Neutrophils % 02/13/2023 93.7  % Final    Lymphocytes % 02/13/2023 1.5  % Final    Monocytes % 02/13/2023 4.6  % Final    Eosinophils % 02/13/2023 0.0  % Final    Basophils % 02/13/2023 0.2  % Final    Absolute Neutrophils 02/13/2023 11.5 (H)  1.8 - 7.8 10*9/L Final    Absolute Lymphocytes 02/13/2023 0.2 (L)  1.1 - 3.6 10*9/L Final    Absolute Monocytes 02/13/2023 0.6 0.3 - 0.8 10*9/L Final    Absolute Eosinophils 02/13/2023 0.0  0.0 - 0.5 10*9/L Final    Absolute Basophils 02/13/2023 0.0  0.0 - 0.1 10*9/L Final    Macrocytosis 02/13/2023 Slight (A)  Not Present Final    Anisocytosis 02/13/2023 Slight (A)  Not Present Final    Smear Review Comments 02/13/2023 See Comment (A)  Undefined Final    [id number] Slide Reviewed.     Neutrophil Left Shift 02/13/2023 Present (A)  Not Present Final

## 2023-02-14 NOTE — Unmapped (Signed)
Report received from [facility]. Rituximab running at ordered rate. Patient completed and tolerated treatment.  AVS declined, PIV removed and patient discharged to home.

## 2023-02-16 DIAGNOSIS — C8589 Other specified types of non-Hodgkin lymphoma, extranodal and solid organ sites: Principal | ICD-10-CM

## 2023-02-16 MED ORDER — TEMOZOLOMIDE 5 MG CAPSULE
ORAL_CAPSULE | Freq: Every day | ORAL | 0 refills | 28 days
Start: 2023-02-16 — End: ?

## 2023-02-16 MED ORDER — TEMOZOLOMIDE 20 MG CAPSULE
ORAL_CAPSULE | Freq: Every day | ORAL | 0 refills | 28 days
Start: 2023-02-16 — End: ?

## 2023-02-16 NOTE — Unmapped (Signed)
Encounter addended by: Caryl Comes, RN on: 02/16/2023 11:53 AM   Actions taken: LDA properties accepted

## 2023-02-17 MED ORDER — TEMOZOLOMIDE 20 MG CAPSULE
ORAL_CAPSULE | Freq: Every day | ORAL | 0 refills | 28 days | Status: CP
Start: 2023-02-17 — End: ?

## 2023-02-17 MED ORDER — TEMOZOLOMIDE 5 MG CAPSULE
ORAL_CAPSULE | Freq: Every day | ORAL | 0 refills | 28 days | Status: CP
Start: 2023-02-17 — End: ?
  Filled 2023-02-23: qty 56, 28d supply, fill #0

## 2023-02-17 NOTE — Unmapped (Signed)
Pt is requesting refill    Most recent clinic visit: 02/13/2023  Next clinic visit:  Visit date not found

## 2023-02-18 NOTE — Unmapped (Signed)
Lymphoma Clinic Follow-Up     Assessment/Plan  Diagnosis: primary CNS lymphoma, DLBCL diagnosed 06/03/20  Regimen: MATRIX (Methotrexate, cytarabine, thiotepa, rituximab): started 06/07/20-08/10/20 for 4 cycles --> pr-->single agent methotrexate x 3 cycles --> further decrease in size of masses   Not a transplant candidate due to poor PFTs and active smoking   Whole brain radiation 2340cGy given at 180cGy x 13 from 01/17/21-02/04/21 --> CR  Relapse - progression seen on routine MRI scan in new sites 08/15/2021 , seizure like activity noted 09/05/21 and keppra started  Regimen #2:  5 fractions of Cyberknife to involved areas (2500cGy) completed 09/06/21  Ibrutinib started 09/06/21--06/2022  MRI brain 11/06/21 - significantly improved   MRI 01/09/22 and 03/12/22: No residual enhancing lesions or disease progression; stable T2/flair- may represent posttreatment changes and superimposed chronic small vessel ischemic disease.    MRI 06/2022 -  ? Of new progression versus radiation necrosis.    MRI 07/10/22 -  Stable 0.8cm area of necrosis vs recurrent disase; New 0.4x0.4cm lesion in right parietal lobe Progressive disease  Regimen #3  07/25/22 lenalidomide obinutuzumab x 2 cycles --> PD 09/16/22 brain MRI  Regimen #4: 09/26/22 - CAR therapy as part of the  LCCC 1813-ATL: A Phase I Study of Autologous Activated T-Cells Targeting the CD19 Antigen and Containing the Inducible Caspase 9 Safety Switch in Subjects with Relapsed/Refractory B-cell Lymphoma and Chronic Lymphocytic Leukemia/ Small Lymphocytic Lymphoma, IRB# 18-1621 after lymphodepletion with flu/cy    Date of Lymphodepletion: Fludarabine 30 mg/m2 and Bendamustine 70mg /m2 mg/m2 x 3 days (09/19/22-09/22/22)   Current Day from Cell Infusion: Day +70  (09/26/2022 = D0)  Cytokine Release Syndrome: Current Grade: 0. Max Grade: 1  ICANS: Current Grade: 0. Max Grade: 1  Treatment was further complicated by recurrent covid and aspiration pneumonia.  Response: MRI 12/04/22 shows all the lesions have decreased slightly in size  Progression of disease on MRI 01/19/23 w/ significant edema  Regimen # 5: 01/30/23 - rituxiamb and low dose daily temodar     Interval History  HE feels like his LLE is gettinga  little clumsier again. He continues to sleep much of the day, in an arm chair.  No significant confusion. No polyuria or polydypsia.  He checks his glucose at home and takes prn insulin.        Hematology/Oncology History   Primary CNS lymphoma (CMS-HCC)   11/28/2020 Initial Diagnosis    Primary CNS lymphoma (CMS-HCC)     11/30/2020 - 11/30/2020 Chemotherapy    IP LYMPHOMA HIGH-DOSE METHOTREXATE  riTUXimab 375 mg/m2 (standard and rapid infusion) IV on day 1, vinCRIStine 1.4 mg/m2 (max 2 mg) IV on day 1, DOXOrubicin 50 mg/m2 IV on day 1, cyclophosphamide 750 mg/m2 IV on day 1, predniSONE 100 mg PO on days 1-5, methotrexate (high-dose) IV on Day 15 (even cycles), every 21-day.     01/03/2021 - 09/15/2022 Radiation    Radiation Therapy Treatment Details (01/03/2021 - 09/15/2022)  Site: Midline Brain  Technique: 3D CRT  Goal: No goal specified  Planned Treatment Start Date: No planned start date specified     08/23/2021 - 09/15/2022 Radiation    Radiation Therapy Treatment Details (08/23/2021 - 09/15/2022)  Site: Bilateral Brain  Technique: SRS  Goal: No goal specified  Planned Treatment Start Date: No planned start date specified     08/01/2022 - 08/25/2022 Chemotherapy    OP LYMPHOMA LENALIDOMIDE +/- RITUXIMAB +/- MAINTENANCE  Lenalidomide 20 mg PO on days 1 to 21, riTUXimab 375 mg/m2 IV  on days 1, 8, 15, 22 of Cycle 1, then 375 mg/m2 IV on day 1 monthly or bimonthly for 1-2 years, every 28 days     09/17/2022 -  Chemotherapy    STUDY LCCC1813-ATL IRB# 18-1621 OP LYMPHODEPLETION (FLU/BENDA) + OP IC9-CAR19 INFUSION (v. 08/06/20)  A Phase I Study of Autologous Activated T-cells Targeting the CD19 Antigen and Containing the Inducible Caspase 9 Safety Switch in Subjects with Relapsed/Refractory B-cell Lymphoma and Chronic Lymphocytic Leukemia/Small Lymphocytic Lymphoma        Past Medical History:   Diagnosis Date    Anxiety     Cancer (CMS-HCC)     COPD (chronic obstructive pulmonary disease) (CMS-HCC)     Cytarabine poisoning     Depression     Diabetes mellitus (CMS-HCC)     Pneumonia, aspiration (CMS-HCC)     Prostate atrophy        Vitals: Baseline BP:  Vitals:    02/13/23 1305   BP: 120/97   Pulse: 84   Resp: 15   Temp: 36.6 ??C (97.9 ??F)   TempSrc: Oral   SpO2: 95%   Weight: 75.5 kg (166 lb 7.2 oz)   Height: 185.4 cm (6' 0.99)       Wt Readings from Last 6 Encounters:   02/13/23 75.5 kg (166 lb 7.2 oz)   01/30/23 78.5 kg (173 lb 1 oz)   01/21/23 78.9 kg (174 lb)   01/16/23 78.9 kg (174 lb)   12/26/22 78 kg (171 lb 15.3 oz)   12/24/22 76.6 kg (168 lb 12.8 oz)        Physical Exam:   General :  No acute distress noted.   Cardiovascular: normal rate, regularity and rhythm. S1 and S2 normal, without any murmur, rub, or gallop.  Lungs: Mild expiratory wheezing bilaterally.   Skin: Warm, dry, intact. No rash noted.   Gastrointestinal:Abdomen soft, non-tender   Musculoskeletal/Extremities: No edema.   Neurologic:- he stands up and moves to the examining table but definitely with more hesitancy and a little less steady than at prior visit.  He seems to have a little bit of trouble cocrdinating the left leg. On exam strength is 5/5 in all 4 extremities except left hip flexor which was 4/5.  EOM not intact - they don't look past midline when looking to the right. Visual fields intact. Oreinted to place, date, and why he is here      Lab Results   Component Value Date    WBC 12.2 (H) 02/13/2023    HGB 14.8 02/13/2023    HCT 43.7 02/13/2023    PLT 69 (L) 02/13/2023     Lab Results   Component Value Date    NA 132 (L) 02/13/2023    K 4.5 02/13/2023    CL 97 (L) 02/13/2023    CO2 23.0 02/13/2023    BUN 18 02/13/2023    CREATININE 0.54 (L) 02/13/2023    GLU 453 (HH) 02/13/2023    CALCIUM 9.6 02/13/2023    MG 2.0 01/22/2023    PHOS 4.3 01/19/2023     Lab Results   Component Value Date    BILITOT 0.4 02/13/2023    BILIDIR 0.10 01/22/2023    PROT 7.4 02/13/2023    ALBUMIN 3.8 02/13/2023    ALT 33 02/13/2023    AST 29 02/13/2023    ALKPHOS 79 02/13/2023     Lab Results   Component Value Date    INR 0.98 01/19/2023    APTT  27.5 11/03/2022           Assessment and Plan:  Andre Duncan is a 57 y.o. enrolled on LCCC 1813-ATL. 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 He received CAR T-cell infusion (09/26/22) with dose Level 2: 2 x 10^6 cells/kg.      Disease: Primary CNS Lymphoma. He has received mutotiple lines of therapy, most recently CART on 09/26/22. Unfortunately, imaging from a few weeks ago showed progression of disease.  I had a long talk with him in early May  about prognosis and the chances that the next treamtent won't work and there aren't great alternative treatment options after that. He understands how poor the prognosis is, but since he is doing ok right now he wants to proceed with treatment.  He is DNR/I.       Just looking at temodar alone, without rituximab, a phase II trial of Twenty-three patients with a Median age was 25 year showed Five complete remissions (median duration 6+ months; range 2-36 months), one partial response, four stable disease (median duration 7.2 months, range 2-16.5 months), and 13 progressions were observed. No major toxicities were observed, apart grade 3 vomiting in a single cycle. Main grade 1-2 toxicities were: 15% nausea, 6% vomiting, 9% fatigue and 9% neurological symptoms. This is the first prospective trial assessing single-agent activity in PCNSL at failure. Although some patients had a poor PS and had been heavily pre-treated, temozolomide yielded 26% objective responses and was well tolerated without any major toxicity. Reni et al. Eur J Cancer 2004 Jul;40(11):1682-8      After discussion with members of the brain malignancy group, we are giving him atypical dosing with a lower dose given daily rather than a larger dose intermittently each month. We are starting at an even lower dose because he has recently had significant cytopenias and even though they are getting better now, thrombocytopenia in particular is a frequent side effect so I will still start at this lower dose but may increase with next cycle if tolerated well. The studies gave a higher dose of rituxiamb (to cross blood brain barrier) weekly for the first 2 cycles so we will follow thiw plan.     He has been on low dose temodar for several weeks no and is tolerating it well.It is too soon to know whehter it will be effective or not although I am a little worried that his leg is feeling less coordinated.  We will continue temodar/rituximab today. We will not decrease rituximab today but if overall stable at the next visit we will start to decrease.    Plan to repeat MRI after 2 cycles.     ID:   COVID-19 + on 09/24/22, and 10/17/2022 and 10/31/22 and 01/2023 - treated again during last hospital stay.      Prophylaxis:   PCP prophylaxs continuing through 1 year after CART and also now bc of steroids.  Consider extending if CD4 remains < 200 at 12 months For now we are using pentamadine since he is still thrombocytopenia.  Pentamidine given on 4/16. WiIll need this monthly. Next due around 5/22  -Antiviral: Valacyclovir 500 mg po BID until 12 months post CAR-T infusion and CD4+ >200.     Immunosuppressed state. CD4 counts:  -Start checking CD4 counts monthly starting at 3 months post CAR-T infusion then at 6, 12, 18 and 24 months. If <200 will need to extend PJP and antiviral prophylaxis.   -cd4 count 63 on 01/30/23  Hem:   thrombocytopenia -  Platelets still low. No transfusion needed.    GI:   GERD:   pantoprazole.     Left inguinal hernia:   - He was coughing overnight on 09/24/22 and sounds like he developed an incarcerated hernia with possible strangulation c/b nausea/vomiting. He was advised to go to [facility] for emergent surgical evaluation. The hernia was able to be reduced in the ED.  -no active herniation at this time.     CV:   - Hyperlipidemia- Patient met with his PCP on 09/15/22, who repeated his lipid panel. Patient reportedly had stopped taking his prescribed statin due to leg cramps. His PCP Ali Lowe) gave a prescription for ezetimibe (Zetia). He is going to discusst his further with his PCP.     Pulm:   - COPD: The patient and his wife report that he was diagnosed in 2022, but there was documentation of COPD at the time of original diagnosis in Aug, 2021. He is a long time smoker.  PFT's in March, 2022, showed reduced DLCO. Continue inhalers.  Stopped smoking when he was admitted for his CAR-T infusion but he has started again.       Endo:   - Diabetes mellitus: Diagnosed in 2022; patient is managing with metformin presently.   - 10/17/22: SSI started on admission  - 10/19/22: Added 5u Lantus  - 10/20/22: Restarted Metformin 1000mg  BID  - 10/22/22: Continue Metformin 1000 mg BID on discharge, increased Lantus to 15U nightly. Continue SSI.   -10/30/22: Blood glucose decreasing as we decrease dex, Lantus to 12 units nightly, continue metformin.   -02/13/23 - blood sugar very hey but no other metabolic alterations and no polyuria.  Will increase metofrmin form 500mg  bid to 1gm bid. Will get 10 units of insulin in infusion today.     Clarene Critchley, MD  Associate Professor  Division of Hematology

## 2023-02-19 NOTE — Unmapped (Signed)
[facility] Clinical Assessment & Refill Coordination Note    Andre Duncan, DOB: 08/12/1966  Phone: [phone] (home)     All above HIPAA information was verified with patient's family member, wife Andre Duncan.     Was a Nurse, learning disability used for this call? No    Specialty Medication(s):   Hematology/Oncology: Temozolomide 20 mg and Temozolomide 5mg      Current Outpatient Medications   Medication Sig Dispense Refill    acetaminophen (TYLENOL EXTRA STRENGTH) 500 MG tablet Take 1 tablet (500 mg total) by mouth every six (6) hours as needed for pain. (Patient not taking: Reported on 02/13/2023)      albuterol HFA 90 mcg/actuation inhaler Inhale 2 puffs every six (6) hours as needed for wheezing or shortness of breath. With Spacer 18 g 3    aluminum-magnesium hydroxide (MAALOX) 200-200 mg/5 mL suspension Take by mouth every six (6) hours as needed for indigestion. (Patient not taking: Reported on 02/13/2023)      blood sugar diagnostic (ACCU-CHEK GUIDE TEST STRIPS) Strp Use as instructed. Check blood sugar four times a day (before meals and at bedtime). 100 each 0    blood-glucose meter kit Use as instructed. Check blood sugar four times a day (before meals and at bedtime). 1 each 0    cetirizine (ZYRTEC) 10 MG tablet Take 1 tablet (10 mg total) by mouth nightly.      cholecalciferol, vitamin D3-50 mcg, 2,000 unit,, 50 mcg (2,000 unit) tablet Take 1 tablet (50 mcg total) by mouth daily.      dexAMETHasone (DECADRON) 4 MG tablet Take 2 tablets (8 mg total) by mouth every twelve (12) hours. 120 tablet 0    docusate sodium (COLACE) 100 MG capsule Take 1 capsule (100 mg total) by mouth two (2) times a day. 180 capsule 1    escitalopram oxalate (LEXAPRO) 20 MG tablet Take 1 tablet (20 mg total) by mouth daily. 90 tablet 2    ezetimibe (ZETIA) 10 mg tablet Take 10 mg by mouth daily. Not taking (Patient not taking: Reported on 02/13/2023)      finasteride (PROSCAR) 5 mg tablet Take 1 tablet (5 mg total) by mouth daily. 90 tablet 1    fluoride, sodium, 1.1 % Gel Brush teeth with a pea-sized amount of the paste for 2 min and spit.  No rinsing. Do not eat or drink for 30 minutes after use. Use twice daily 56 g PRN    ipratropium-albuteroL (DUO-NEB) 0.5-2.5 mg/3 mL nebulizer Inhale 3 mL by nebulization every six (6) hours as needed (wheezing, shortness of breath). 270 mL 11    lancets (ACCU-CHEK SOFTCLIX LANCETS) Misc Use as instructed. Check blood sugar four times a day (before meals and at bedtime). 100 each 0    levETIRAcetam (KEPPRA) 500 MG tablet Take 1 tablet (500 mg total) by mouth two (2) times a day. 60 tablet 4    memantine (NAMENDA) 10 MG tablet Take 1 tablet (10 mg total) by mouth Two (2) times a day. 60 tablet 4    metFORMIN (GLUCOPHAGE) 500 MG tablet Take 2 tablets (1,000 mg total) by mouth in the morning and 2 tablets (1,000 mg total) in the evening. Take with meals. (Patient taking differently: Take 1 tablet (500 mg total) by mouth in the morning and 1 tablet (500 mg total) in the evening. Take with meals.) 360 tablet 1    mirtazapine (REMERON) 30 MG tablet Take 1 tablet (30 mg total) by mouth nightly. 90  tablet 1    multivitamin (TAB-A-VITE/THERAGRAN) per tablet Take 1 tablet by mouth daily.      nicotine (NICODERM CQ) 21 mg/24 hr patch Place 1 patch on the skin and change daily. (Patient not taking: Reported on 02/13/2023) 28 patch 0    ondansetron (ZOFRAN) 8 MG tablet Take 1 tablet (8 mg total) by mouth every eight (8) hours as needed for nausea. (Patient not taking: Reported on 02/13/2023) 60 tablet 6    pantoprazole (PROTONIX) 40 MG tablet Take 1 tablet (40 mg total) by mouth daily. (Patient taking differently: Take 1 tablet (40 mg total) by mouth two (2) times a day.) 30 tablet 5    pen needle, diabetic (UNIFINE PENTIPS) 32 gauge x 1/4 (6 mm) Ndle Use as instructed. Check blood sugar four times a day (before meals and at bedtime). 100 each 0    senna-docusate (PERICOLACE) 8.6-50 mg Take 1 tablet by mouth nightly. 100 tablet 1    temozolomide (TEMODAR) 20 mg capsule Take 2 capsules (40 mg total) by mouth daily  with 1 other temozolomide prescription for 50 mg total. 56 capsule 0    temozolomide (TEMODAR) 5 mg capsule Take 2 capsules (10 mg total) by mouth daily  with 1 other temozolomide prescription for 50 mg total. 56 capsule 0    thiamine (B-1) 100 MG tablet Take 1 tablet (100 mg total) by mouth daily. 100 tablet 1    valACYclovir (VALTREX) 500 MG tablet Take 1 tablet (500 mg total) by mouth two (2) times a day. 60 tablet 11     No current facility-administered medications for this visit.        Changes to medications: Andre Duncan reports no changes at this time.    Allergies   Allergen Reactions    Sulfa (Sulfonamide Antibiotics) Rash     Developed during hospitalization d/c     Tegaderm Adhesive-No Drug-Allergy Check Other (See Comments)       Changes to allergies: No    SPECIALTY MEDICATION ADHERENCE     temozolomide 20 mg: 7 days of medicine on hand   temozolomide 5 mg: 7 days of medicine on hand     Medication Adherence    Patient reported X missed doses in the last month: 0  Specialty Medication: temozolomide 50 mg once daily  Patient is on additional specialty medications: No  Informant: spouse  Confirmed plan for next specialty medication refill: delivery by pharmacy  Refills needed for supportive medications: not needed          Specialty medication(s) dose(s) confirmed: Regimen is correct and unchanged.     Are there any concerns with adherence? No    Adherence counseling provided? Not needed    CLINICAL MANAGEMENT AND INTERVENTION      Clinical Benefit Assessment:    Do you feel the medicine is effective or helping your condition? Yes    Clinical Benefit counseling provided? Not needed    Adverse Effects Assessment:    Are you experiencing any side effects? No    Are you experiencing difficulty administering your medicine? No    Quality of Life Assessment:    Quality of Life Rheumatology  Oncology  1. What impact has your specialty medication had on the reduction of your daily pain or discomfort level?: Some  2. On a scale of 1-10, how would you rate your ability to manage side effects associated with your specialty medication? 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 For example, brushing your teeth or getting up in the morning. 0    Have you discussed this with your provider? Not needed    Acute Infection Status:    Acute infections noted within Epic:  No active infections  Patient reported infection: None    Therapy Appropriateness:    Is therapy appropriate and patient progressing towards therapeutic goals? Yes, therapy is appropriate and should be continued    DISEASE/MEDICATION-SPECIFIC INFORMATION      N/A    Oncology: Is the patient receiving adequate infection prevention treatment? Not applicable  Does the patient have adequate nutritional support? Not applicable    PATIENT SPECIFIC NEEDS     Does the patient have any physical, cognitive, or cultural barriers? No    Is the patient high risk? Yes, patient is taking oral chemotherapy. Appropriateness of therapy as been assessed    Did the patient require a clinical intervention? No    Does the patient require physician intervention or other additional services (i.e., nutrition, smoking cessation, social work)? No    SOCIAL DETERMINANTS OF HEALTH     At the [facility], we have learned that life circumstances - like trouble affording food, housing, utilities, or transportation can affect the health of many of our patients.   That is why we wanted to ask: are you currently experiencing any life circumstances that are negatively impacting your health and/or quality of life? Patient declined to answer    Social Determinants of Health     Financial Resource Strain: Low Risk  (10/22/2022)    Overall Financial Resource Strain (CARDIA)     Difficulty of Paying Living Expenses: Not very hard   Internet Connectivity: Not on file   Food Insecurity: No Food Insecurity (10/22/2022)    Hunger Vital Sign     Worried About Running Out of Food in the Last Year: Never true     Ran Out of Food in the Last Year: Never true   Tobacco Use: Medium Risk (01/21/2023)    Patient History     Smoking Tobacco Use: Former     Smokeless Tobacco Use: Never     Passive Exposure: Not on file   Housing/Utilities: Low Risk  (10/22/2022)    Housing/Utilities     Within the past 12 months, have you ever stayed: outside, in a car, in a tent, in an overnight shelter, or temporarily in someone else's home (i.e. couch-surfing)?: No     Are you worried about losing your housing?: No     Within the past 12 months, have you been unable to get utilities (heat, electricity) when it was really needed?: No   Alcohol Use: Not At Risk (05/07/2022)    Alcohol Use     How often do you have a drink containing alcohol?: Never     How many drinks containing alcohol do you have on a typical day when you are drinking?: Not on file     How often do you have 5 or more drinks on one occasion?: Never   Transportation Needs: No Transportation Needs (10/22/2022)    PRAPARE - Transportation     Lack of Transportation (Medical): No     Lack of Transportation (Non-Medical): No   Substance Use: Low Risk  (05/07/2022)    Substance Use  Taken prescription drugs for non-medical reasons: Never     Taken illegal drugs: Never     Patient indicated they have taken drugs in the past year for non-medical reasons: Yes, [positive answer(s)]: Not on file   Health Literacy: Not on file   Physical Activity: Not on file   Interpersonal Safety: Not on file   Stress: Stress Concern Present (11/05/2020)    Received from [facility] - Occupational Stress Questionnaire     Feeling of Stress : Rather much Intimate Partner Violence: Unknown (01/10/2022)    Received from [facility]    HITS     Physically Hurt: Not on file     Insult or Talk Down To: Not on file     Threaten Physical Harm: Not on file     Scream or Curse: Not on file   Depression: Not at risk (01/30/2023)    PHQ-2     PHQ-2 Score: 1   Social Connections: Unknown (02/18/2022)    Received from [facility]    Social Network     Social Network: Not on file     Would you be willing to receive help with any of the needs that you have identified today? Not applicable       SHIPPING     Specialty Medication(s) to be Shipped:   Hematology/Oncology: Temozolomide 20 mg and Temozolomide 5mg     Other medication(s) to be shipped: No additional medications requested for fill at this time     Changes to insurance: No    Delivery Scheduled: Yes, Expected medication delivery date: 02/24/23.     Medication will be delivered via Next Day Courier to the confirmed prescription address in [facility].    The patient will receive a drug information handout for each medication shipped and additional FDA Medication Guides as required.  Verified that patient has previously received a Conservation officer, historic buildings and a Surveyor, mining.    The patient or caregiver noted above participated in the development of this care plan and knows that they can request review of or adjustments to the care plan at any time.      All of the patient's questions and concerns have been addressed.    Kermit Balo, [facility]   [facility] Specialty Pharmacist

## 2023-02-20 DIAGNOSIS — C8589 Other specified types of non-Hodgkin lymphoma, extranodal and solid organ sites: Principal | ICD-10-CM

## 2023-02-20 NOTE — Unmapped (Signed)
Hi,     Jenny(spouse) contacted the Communication Center requesting to speak with the care team of Andre Duncan to discuss:    -patient has been very sick and weak all week, yesterday he became nauseated, has diarrhea, off balance and had a fall yesterday, and it now unable to stand.     -patient is unable to make nurse visit and infusion today and needs to be rescheduled.     -scheduler is attached.    Please contact [name]  at [phone] .        [x]  Preferred Name   [x]  DOB and/or MR#  [x]  Preferred Contact Method  [x]  Phone Number(s)   [x]  MyChart     Thank you,   Durward Fortes  [facility]   [phone]

## 2023-02-20 NOTE — Unmapped (Signed)
Spoke with [employer] She had already spoken with Dr. Elmon Kirschner and per [name] was waiting for social work to contact her. She was appreciative of call and thankful for Dr. Kirt Boys quick response.

## 2023-02-21 NOTE — Unmapped (Signed)
LWBS/AMA Patient Follow-Up Call  Chart reviewed after patient LWBS after triage. Patient has since been evaluated by Muniz on another visit, per chart documentation. No further actions needed.

## 2023-02-23 DIAGNOSIS — E639 Nutritional deficiency, unspecified: Principal | ICD-10-CM

## 2023-02-23 DIAGNOSIS — C8330 Diffuse large B-cell lymphoma, unspecified site: Principal | ICD-10-CM

## 2023-02-23 MED ORDER — PEN NEEDLE, DIABETIC 32 GAUGE X 1/4" (6 MM)
0 refills | 0 days | Status: CP
Start: 2023-02-23 — End: ?

## 2023-02-23 MED ORDER — ACCU-CHEK GUIDE TEST STRIPS
0 refills | 0 days | Status: CP
Start: 2023-02-23 — End: ?
  Filled 2023-03-05: qty 100, 30d supply, fill #0

## 2023-02-23 MED ORDER — THIAMINE HCL (VITAMIN B1) 100 MG TABLET
ORAL_TABLET | Freq: Every day | ORAL | 1 refills | 100 days | Status: CP
Start: 2023-02-23 — End: ?
  Filled 2023-02-26: qty 100, 100d supply, fill #0

## 2023-02-23 MED ORDER — DEXAMETHASONE 4 MG TABLET
ORAL_TABLET | Freq: Two times a day (BID) | ORAL | 0 refills | 30 days | Status: CP
Start: 2023-02-23 — End: 2023-03-25

## 2023-02-23 MED ORDER — LANCETS
0 refills | 0 days | Status: CP
Start: 2023-02-23 — End: ?
  Filled 2023-03-05: qty 100, 25d supply, fill #0

## 2023-02-23 MED ORDER — MEMANTINE 10 MG TABLET
ORAL_TABLET | Freq: Two times a day (BID) | ORAL | 4 refills | 30 days | Status: CP
Start: 2023-02-23 — End: ?
  Filled 2023-03-09: qty 60, 30d supply, fill #0

## 2023-02-23 MED ORDER — PANTOPRAZOLE 40 MG TABLET,DELAYED RELEASE
ORAL_TABLET | Freq: Every day | ORAL | 5 refills | 30 days
Start: 2023-02-23 — End: ?

## 2023-02-23 MED FILL — TEMOZOLOMIDE 20 MG CAPSULE: ORAL | 28 days supply | Qty: 56 | Fill #0

## 2023-02-23 NOTE — Unmapped (Signed)
Refill request received from patient.      Medication Requested: Memantine 10 mg   Last Office Visit: 12/17/2022   Next Office Visit: Visit date not found  Last Prescriber: Dr. Sabino Niemann     Nurse refill requirements met? Yes  If not met, why:     Sent to: Pharmacy per protocol  If sent to provider, which provider?:

## 2023-02-23 NOTE — Unmapped (Signed)
Patient is requesting the following refill  Requested Prescriptions     Pending Prescriptions Disp Refills    blood sugar diagnostic (ACCU-CHEK GUIDE TEST STRIPS) Strp 100 each 0     Sig: Use as instructed. Check blood sugar four times a day (before meals and at bedtime).    lancets (ACCU-CHEK SOFTCLIX LANCETS) Misc 100 each 0     Sig: Use as instructed. Check blood sugar four times a day (before meals and at bedtime).    pen needle, diabetic (UNIFINE PENTIPS) 32 gauge x 1/4 (6 mm) Ndle 100 each 0     Sig: Use as instructed. Check blood sugar four times a day (before meals and at bedtime).       Recent Visits  Date Type Provider Dept   09/08/22 Office Visit Coralee North, Magnus Ivan, FNP [facility] [address] [facility]   05/07/22 Office Visit Loran Senters, FNP [facility] [address] [facility]   Showing recent visits within past 365 days with a meds authorizing provider and meeting all other requirements  Future Appointments  Date Type Provider Dept   03/12/23 Appointment Coralee North, Magnus Ivan, FNP [facility] [address] [facility]   Showing future appointments within next 365 days with a meds authorizing provider and meeting all other requirements       Labs:

## 2023-02-24 NOTE — Unmapped (Signed)
Attempted to call [name] wife regarding home glucose values and restarting home insulin while taking dexamethasone. No answer after three attempts, will try again tomorrow.    Time spent in direct patient care: 5 minutes    Hale Bogus, PharmD  PGY2 Oncology Pharmacy Resident    Pharmacist Attestation: I was the supervising pharmacist in the delivery of services.    [place] Bettina Warn, PharmD, BCOP, CPP  Clinical Pharmacist Practitioner, Lymphoma  Pager: [phone]

## 2023-02-25 DIAGNOSIS — C8589 Other specified types of non-Hodgkin lymphoma, extranodal and solid organ sites: Principal | ICD-10-CM

## 2023-02-25 MED ORDER — DEXAMETHASONE 4 MG TABLET
ORAL_TABLET | Freq: Two times a day (BID) | ORAL | 0 refills | 30 days | Status: CP
Start: 2023-02-25 — End: 2023-03-27

## 2023-02-25 MED ORDER — ESCITALOPRAM 20 MG TABLET
ORAL_TABLET | Freq: Every day | ORAL | 2 refills | 90 days | Status: CP
Start: 2023-02-25 — End: ?
  Filled 2023-02-26: qty 90, 90d supply, fill #0

## 2023-02-25 MED ORDER — VALACYCLOVIR 500 MG TABLET
ORAL_TABLET | Freq: Two times a day (BID) | ORAL | 11 refills | 30 days | Status: CP
Start: 2023-02-25 — End: ?

## 2023-02-25 NOTE — Unmapped (Signed)
Clinical Pharmacist Practitioner: Lymphoma Clinic       Patient Name: Andre Duncan  Patient Age: 57 y.o.  Encounter Date: 02/25/2023    Initial reason for consult: Supportive Care  Referred by: Dr. Elmon Kirschner    Chemotherapy regimen: Rituximab + Temozolomide    Current place in therapy is C1D27.     Start date: 01/30/23    Subjective/Objective:  Patient's wife, Andre Duncan, was contacted today following communication from her regarding persistent hyperglycemia for the patient. Patient was seen on 02/13/23 and his BG was 453 mg/dL. He was given a dose of Insulin 10 units and his Metformin was increased from 500 bid to 1000 mg PO bid. Andre Duncan reports that prior to increasing the Metformin his BG was in the 400s at home and that since increasing the Metformin his BG is now in the 300s. Patient has Lantus and Humalog insulin as well as other diabetic supplies at home from previously being prescribed these medications.       Assessment:  Mr./Ms. 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 Elmon Kirschner for management of steroid-induced hyperglycemia. Patient's BG remains high at home (300's). Patient's wife was instructed to start Lantus insulin, 15 units once per day, to monitor BG 4 times per day (fasting AM and 2 hours after meals), and to [place] Humalog insulin and sliding scale insulin (SSI) instructions prn. These instructions will also be sent to patient via MyChart. She verbalized understanding of plan.      The patient reports they are physically located in [place] and is currently: at home. I conducted a phone visit.  I spent 21 minutes on the phone call with the patient on the date of service .     ................    Plan and Recommendations:  Steroid-induced hyperglycemia  - Continue Metformin 1000 mg PO bid  - Start Lantus insulin 15 units daily  - Start Hymalog insulin 4x per day prn. If blood sugar 151-200 give 2 U; if 201-250 give 4 U; if 251-300 give 6 U; if 301-350 give 8 U; if 351-400 give 10 U; if >400 call clinic    Follow up:  - 02/27/23 with Dr. Elmon Kirschner    I spent 21 minutes in direct patient care.    [place] Zavior Thomason, PharmD, BCOP, CPP  Clinical Pharmacist Practitioner, Lymphoma  Pager: [phone]      History of Present Illness:  We had the pleasure of seeing Jerell Viverito in the Lymphoma Clinic at the [place] [facility] [place] on 02/25/2023.     His/her oncologic history is as follows:    Hematology/Oncology History   Primary CNS lymphoma (CMS-HCC)   11/28/2020 Initial Diagnosis    Primary CNS lymphoma (CMS-HCC)     11/30/2020 - 11/30/2020 Chemotherapy    IP LYMPHOMA HIGH-DOSE METHOTREXATE  riTUXimab 375 mg/m2 (standard and rapid infusion) IV on day 1, vinCRIStine 1.4 mg/m2 (max 2 mg) IV on day 1, DOXOrubicin 50 mg/m2 IV on day 1, cyclophosphamide 750 mg/m2 IV on day 1, predniSONE 100 mg PO on days 1-5, methotrexate (high-dose) IV on Day 15 (even cycles), every 21-day.     01/03/2021 - 09/15/2022 Radiation    Radiation Therapy Treatment Details (01/03/2021 - 09/15/2022)  Site: Midline Brain  Technique: 3D CRT  Goal: No goal specified  Planned Treatment Start Date: No planned start date specified     08/23/2021 - 09/15/2022 Radiation    Radiation Therapy  Treatment Details (08/23/2021 - 09/15/2022)  Site: Bilateral Brain  Technique: SRS  Goal: No goal specified  Planned Treatment Start Date: No planned start date specified     08/01/2022 - 08/25/2022 Chemotherapy    OP LYMPHOMA LENALIDOMIDE +/- RITUXIMAB +/- MAINTENANCE  Lenalidomide 20 mg PO on days 1 to 21, riTUXimab 375 mg/m2 IV on days 1, 8, 15, 22 of Cycle 1, then 375 mg/m2 IV on day 1 monthly or bimonthly for 1-2 years, every 28 days     09/17/2022 -  Chemotherapy    STUDY [id number] IRB# [id number] OP LYMPHODEPLETION (FLU/BENDA) + OP IC9-CAR19 INFUSION (v. 08/06/20)  A Phase I Study of Autologous Activated T-cells Targeting the CD19 Antigen and Containing the Inducible Caspase 9 Safety Switch in Subjects with Relapsed/Refractory B-cell Lymphoma and Chronic Lymphocytic Leukemia/Small Lymphocytic Lymphoma            MEDICATIONS:  Current Outpatient Medications   Medication Sig Dispense Refill    acetaminophen (TYLENOL EXTRA STRENGTH) 500 MG tablet Take 1 tablet (500 mg total) by mouth every six (6) hours as needed for pain. (Patient not taking: Reported on 02/13/2023)      albuterol HFA 90 mcg/actuation inhaler Inhale 2 puffs every six (6) hours as needed for wheezing or shortness of breath. With Spacer 18 g 3    aluminum-magnesium hydroxide (MAALOX) 200-200 mg/5 mL suspension Take by mouth every six (6) hours as needed for indigestion. (Patient not taking: Reported on 02/13/2023)      blood sugar diagnostic (ACCU-CHEK GUIDE TEST STRIPS) Strp Use as instructed. Check blood sugar four times a day (before meals and at bedtime). 100 each 0    blood-glucose meter kit Use as instructed. Check blood sugar four times a day (before meals and at bedtime). 1 each 0    cetirizine (ZYRTEC) 10 MG tablet Take 1 tablet (10 mg total) by mouth nightly.      cholecalciferol, vitamin D3-50 mcg, 2,000 unit,, 50 mcg (2,000 unit) tablet Take 1 tablet (50 mcg total) by mouth daily.      dexAMETHasone (DECADRON) 4 MG tablet Take 2 tablets (8 mg total) by mouth every twelve (12) hours. 120 tablet 0    docusate sodium (COLACE) 100 MG capsule Take 1 capsule (100 mg total) by mouth two (2) times a day. 180 capsule 1    escitalopram oxalate (LEXAPRO) 20 MG tablet Take 1 tablet (20 mg total) by mouth daily. 90 tablet 2    ezetimibe (ZETIA) 10 mg tablet Take 10 mg by mouth daily. Not taking (Patient not taking: Reported on 02/13/2023)      finasteride (PROSCAR) 5 mg tablet Take 1 tablet (5 mg total) by mouth daily. 90 tablet 1    fluoride, sodium, 1.1 % Gel Brush teeth with a pea-sized amount of the paste for 2 min and spit.  No rinsing. Do not eat or drink for 30 minutes after use. Use twice daily 56 g PRN    ipratropium-albuteroL (DUO-NEB) 0.5-2.5 mg/3 mL nebulizer Inhale 3 mL by nebulization every six (6) hours as needed (wheezing, shortness of breath). 270 mL 11    lancets (ACCU-CHEK SOFTCLIX LANCETS) Misc Use as instructed. Check blood sugar four times a day (before meals and at bedtime). 100 each 0    levETIRAcetam (KEPPRA) 500 MG tablet Take 1 tablet (500 mg total) by mouth two (2) times a day. 60 tablet 4    memantine (NAMENDA) 10 MG tablet Take 1 tablet (10 mg total) by mouth two (2)  times a day. 60 tablet 4    metFORMIN (GLUCOPHAGE) 500 MG tablet Take 2 tablets (1,000 mg total) by mouth in the morning and 2 tablets (1,000 mg total) in the evening. Take with meals. (Patient taking differently: Take 1 tablet (500 mg total) by mouth in the morning and 1 tablet (500 mg total) in the evening. Take with meals.) 360 tablet 1    mirtazapine (REMERON) 30 MG tablet Take 1 tablet (30 mg total) by mouth nightly. 90 tablet 1    multivitamin (TAB-A-VITE/THERAGRAN) per tablet Take 1 tablet by mouth daily.      nicotine (NICODERM CQ) 21 mg/24 hr patch Place 1 patch on the skin and change daily. (Patient not taking: Reported on 02/13/2023) 28 patch 0    ondansetron (ZOFRAN) 8 MG tablet Take 1 tablet (8 mg total) by mouth every eight (8) hours as needed for nausea. (Patient not taking: Reported on 02/13/2023) 60 tablet 6    pantoprazole (PROTONIX) 40 MG tablet Take 1 tablet (40 mg total) by mouth daily. (Patient taking differently: Take 1 tablet (40 mg total) by mouth two (2) times a day.) 30 tablet 5    pen needle, diabetic (UNIFINE PENTIPS) 32 gauge x 1/4 (6 mm) Ndle Use as instructed. Check blood sugar four times a day (before meals and at bedtime). 100 each 0    senna-docusate (PERICOLACE) 8.6-50 mg Take 1 tablet by mouth nightly. 100 tablet 1    temozolomide (TEMODAR) 20 mg capsule Take 2 capsules (40 mg total) by mouth daily  with 1 other temozolomide prescription for 50 mg total. 56 capsule 0    temozolomide (TEMODAR) 5 mg capsule Take 2 capsules (10 mg total) by mouth daily  with 1 other temozolomide prescription for 50 mg total. 56 capsule 0    thiamine (B-1) 100 MG tablet Take 1 tablet (100 mg total) by mouth daily. 100 tablet 1    valACYclovir (VALTREX) 500 MG tablet Take 1 tablet (500 mg total) by mouth two (2) times a day. 60 tablet 11     No current facility-administered medications for this visit.        ALLERGIES:  Allergies   Allergen Reactions    Sulfa (Sulfonamide Antibiotics) Rash     Developed during hospitalization d/c     Tegaderm Adhesive-No Drug-Allergy Check Other (See Comments)         LABORATORY DATA:  CMP  No results for input(s): NA, K, CL, CO2, BUN, CREATININE, GLU, MG, CALCIUM, PHOS, AST, ALT, ALKPHOS in the last 72 hours.    Complete Blood Count No results for input(s): ABORH, WBC, RBC, HGB, HCT, MCV, MCH, MCHC, PLT, MPV, RDWCV in the last 72 hours.    Invalid input(s): ABO, RH    Differential (Absolute) No results for input(s): DIFFTYPE, SEGSABS, NEUTROABS, LYMPHSABS, MONOSABS, EOSABS, BASOSABS, METAABS, MYELOABS, PROMYELOABS, BLASTSABS, NRBC in the last 72 hours.    Invalid input(s): ABSBAND, ABSOTHER

## 2023-02-26 DIAGNOSIS — C8589 Other specified types of non-Hodgkin lymphoma, extranodal and solid organ sites: Principal | ICD-10-CM

## 2023-02-26 MED ORDER — ESCITALOPRAM 20 MG TABLET
ORAL_TABLET | Freq: Every day | ORAL | 2 refills | 90 days | Status: CN
Start: 2023-02-26 — End: ?

## 2023-02-26 MED FILL — DOCUSATE SODIUM 100 MG CAPSULE: ORAL | 90 days supply | Qty: 180 | Fill #0

## 2023-02-26 MED FILL — FINASTERIDE 5 MG TABLET: ORAL | 90 days supply | Qty: 90 | Fill #0

## 2023-02-26 MED FILL — STOOL SOFTENER-STIMULANT LAXATIVE 8.6 MG-50 MG TABLET: ORAL | 100 days supply | Qty: 100 | Fill #0

## 2023-02-27 ENCOUNTER — Ambulatory Visit: Admit: 2023-02-27 | Discharge: 2023-02-28 | Payer: PRIVATE HEALTH INSURANCE

## 2023-02-27 ENCOUNTER — Other Ambulatory Visit: Admit: 2023-02-27 | Discharge: 2023-02-28 | Payer: PRIVATE HEALTH INSURANCE

## 2023-02-27 ENCOUNTER — Encounter: Admit: 2023-02-27 | Discharge: 2023-02-28 | Payer: PRIVATE HEALTH INSURANCE

## 2023-02-27 DIAGNOSIS — C8589 Other specified types of non-Hodgkin lymphoma, extranodal and solid organ sites: Principal | ICD-10-CM

## 2023-02-27 LAB — CBC W/ AUTO DIFF
BASOPHILS ABSOLUTE COUNT: 0 10*9/L (ref 0.0–0.1)
BASOPHILS RELATIVE PERCENT: 0.4 %
EOSINOPHILS ABSOLUTE COUNT: 0 10*9/L (ref 0.0–0.5)
EOSINOPHILS RELATIVE PERCENT: 0.1 %
HEMATOCRIT: 40.4 % (ref 39.0–48.0)
HEMOGLOBIN: 14 g/dL (ref 12.9–16.5)
LYMPHOCYTES ABSOLUTE COUNT: 0.1 10*9/L — ABNORMAL LOW (ref 1.1–3.6)
LYMPHOCYTES RELATIVE PERCENT: 6.1 %
MEAN CORPUSCULAR HEMOGLOBIN CONC: 34.6 g/dL (ref 32.0–36.0)
MEAN CORPUSCULAR HEMOGLOBIN: 36.1 pg — ABNORMAL HIGH (ref 25.9–32.4)
MEAN CORPUSCULAR VOLUME: 104.5 fL — ABNORMAL HIGH (ref 77.6–95.7)
MEAN PLATELET VOLUME: 7.9 fL (ref 6.8–10.7)
MONOCYTES ABSOLUTE COUNT: 0 10*9/L — ABNORMAL LOW (ref 0.3–0.8)
MONOCYTES RELATIVE PERCENT: 2.1 %
NEUTROPHILS ABSOLUTE COUNT: 2.1 10*9/L (ref 1.8–7.8)
NEUTROPHILS RELATIVE PERCENT: 91.3 %
PLATELET COUNT: 49 10*9/L — ABNORMAL LOW (ref 150–450)
RED BLOOD CELL COUNT: 3.86 10*12/L — ABNORMAL LOW (ref 4.26–5.60)
RED CELL DISTRIBUTION WIDTH: 16.9 % — ABNORMAL HIGH (ref 12.2–15.2)
WBC ADJUSTED: 2.3 10*9/L — ABNORMAL LOW (ref 3.6–11.2)

## 2023-02-27 LAB — COMPREHENSIVE METABOLIC PANEL
ALBUMIN: 3.4 g/dL (ref 3.4–5.0)
ALKALINE PHOSPHATASE: 67 U/L (ref 46–116)
ALT (SGPT): 48 U/L (ref 10–49)
ANION GAP: 13 mmol/L (ref 5–14)
AST (SGOT): 39 U/L — ABNORMAL HIGH (ref <=34)
BILIRUBIN TOTAL: 0.4 mg/dL (ref 0.3–1.2)
BLOOD UREA NITROGEN: 15 mg/dL (ref 9–23)
BUN / CREAT RATIO: 33
CALCIUM: 8.7 mg/dL (ref 8.7–10.4)
CHLORIDE: 98 mmol/L (ref 98–107)
CO2: 24 mmol/L (ref 20.0–31.0)
CREATININE: 0.46 mg/dL — ABNORMAL LOW
EGFR CKD-EPI (2021) MALE: >90 mL/min/{1.73_m2} (ref >=60)
GLUCOSE RANDOM: 463 mg/dL (ref 70–179)
POTASSIUM: 4.6 mmol/L (ref 3.4–4.8)
PROTEIN TOTAL: 6.7 g/dL (ref 5.7–8.2)
SODIUM: 135 mmol/L (ref 135–145)

## 2023-02-27 MED ADMIN — diphenhydrAMINE (BENADRYL) capsule 50 mg: 50 mg | ORAL | @ 17:00:00 | Stop: 2023-02-27

## 2023-02-27 MED ADMIN — riTUXimab-abbs (TRUXIMA) 1,515 mg in sodium chloride (NS) 0.9 % 600 mL IVPB: 750 mg/m2 | INTRAVENOUS | @ 18:00:00 | Stop: 2023-02-27

## 2023-02-27 MED ADMIN — pentamidine (PENTAM) inhalation solution: 300 mg | RESPIRATORY_TRACT | @ 17:00:00 | Stop: 2023-02-27

## 2023-02-27 MED ADMIN — albuterol 2.5 mg /3 mL (0.083 %) nebulizer solution 2.5 mg: 2.5 mg | RESPIRATORY_TRACT | @ 17:00:00

## 2023-02-27 MED ADMIN — acetaminophen (TYLENOL) tablet 650 mg: 650 mg | ORAL | @ 17:00:00 | Stop: 2023-02-27

## 2023-02-27 NOTE — Unmapped (Signed)
Pt. Here in bed 8 for chemo and inhalation therapy.    Labs reviewed, Plt- 49, with OK TO TREAT order from Dr. Elmon Kirschner, wife confirmed that pt is taking temozolomide at home.    Pre meds given, inhalation therapy given.  Albuterol and pentamidine nebulization given respectively.      Pending chemo med from pharmacy.    Chemo infusion started, tolerating well, titrated according to the treatment plan.  Currently at 400mg /hr, rate- 158, 1830- cbg checked- 356 mg/dl, wife and pt educated regarding food intake, encouraged to keep a journal for cbg results to keep track.    Patient report handed over to [employer].

## 2023-02-28 LAB — LYMPH MARKER LIMITED,FLOW
ABSOLUTE CD3 CNT: 40 {cells}/uL — ABNORMAL LOW (ref 915–3400)
ABSOLUTE CD4 CNT: 10 {cells}/uL — ABNORMAL LOW (ref 510–2320)
ABSOLUTE CD8 CNT: 29 {cells}/uL — ABNORMAL LOW (ref 180–1520)
CD3% (T CELLS): 40 % — ABNORMAL LOW (ref 61–86)
CD4% (T HELPER): 10 % — ABNORMAL LOW (ref 34–58)
CD4:CD8 RATIO: 0.3 — ABNORMAL LOW (ref 0.9–4.8)
CD8% T SUPPRESR: 29 % (ref 12–38)

## 2023-02-28 NOTE — Unmapped (Signed)
Assumed care forpatient from Jan and Marcelino Duster RN. Patient finished rituximab at max rate with no complication. PIV removed & patient discharged with family via wheelchair in stable condition.

## 2023-02-28 NOTE — Unmapped (Signed)
Lab on 02/27/2023   Component Date Value Ref Range Status    ABO Grouping 02/27/2023 A POS   Final    Antibody Screen 02/27/2023 NEG   Final    Sodium 02/27/2023 135  135 - 145 mmol/L Final    Potassium 02/27/2023 4.6  3.4 - 4.8 mmol/L Final    Chloride 02/27/2023 98  98 - 107 mmol/L Final    CO2 02/27/2023 24.0  20.0 - 31.0 mmol/L Final    Anion Gap 02/27/2023 13  5 - 14 mmol/L Final    BUN 02/27/2023 15  9 - 23 mg/dL Final    Creatinine 09/81/1914 0.46 (L)  0.73 - 1.18 mg/dL Final    BUN/Creatinine Ratio 02/27/2023 33   Final    eGFR CKD-EPI (2021) Male 02/27/2023 >90  >=60 mL/min/1.75m2 Final    eGFR calculated with CKD-EPI 2021 equation in accordance with [employer] and [employer] of Nephrology Task Force recommendations.    Glucose 02/27/2023 463 (HH)  70 - 179 mg/dL Final    Calcium 78/29/5621 8.7  8.7 - 10.4 mg/dL Final    Albumin 30/86/5784 3.4  3.4 - 5.0 g/dL Final    Total Protein 02/27/2023 6.7  5.7 - 8.2 g/dL Final    Total Bilirubin 02/27/2023 0.4  0.3 - 1.2 mg/dL Final    AST 69/62/9528 39 (H)  <=34 U/L Final    ALT 02/27/2023 48  10 - 49 U/L Final    Alkaline Phosphatase 02/27/2023 67  46 - 116 U/L Final    WBC 02/27/2023 2.3 (L)  3.6 - 11.2 10*9/L Final    RBC 02/27/2023 3.86 (L)  4.26 - 5.60 10*12/L Final    HGB 02/27/2023 14.0  12.9 - 16.5 g/dL Final    HCT 41/32/4401 40.4  39.0 - 48.0 % Final    MCV 02/27/2023 104.5 (H)  77.6 - 95.7 fL Final    MCH 02/27/2023 36.1 (H)  25.9 - 32.4 pg Final    MCHC 02/27/2023 34.6  32.0 - 36.0 g/dL Final    RDW 02/72/5366 16.9 (H)  12.2 - 15.2 % Final    MPV 02/27/2023 7.9  6.8 - 10.7 fL Final    Platelet 02/27/2023 49 (L)  150 - 450 10*9/L Final    Neutrophils % 02/27/2023 91.3  % Final    Lymphocytes % 02/27/2023 6.1  % Final    Monocytes % 02/27/2023 2.1  % Final    Eosinophils % 02/27/2023 0.1  % Final    Basophils % 02/27/2023 0.4  % Final    Absolute Neutrophils 02/27/2023 2.1  1.8 - 7.8 10*9/L Final    Absolute Lymphocytes 02/27/2023 0.1 (L)  1.1 - 3.6 10*9/L Final    Absolute Monocytes 02/27/2023 0.0 (L)  0.3 - 0.8 10*9/L Final    Absolute Eosinophils 02/27/2023 0.0  0.0 - 0.5 10*9/L Final    Absolute Basophils 02/27/2023 0.0  0.0 - 0.1 10*9/L Final    Macrocytosis 02/27/2023 Slight (A)  Not Present Final    Anisocytosis 02/27/2023 Slight (A)  Not Present Final

## 2023-03-01 NOTE — Unmapped (Signed)
Lymphoma Clinic Follow-Up     Assessment/Plan  Diagnosis: primary CNS lymphoma, DLBCL diagnosed 06/03/20  Regimen: MATRIX (Methotrexate, cytarabine, thiotepa, rituximab): started 06/07/20-08/10/20 for 4 cycles --> pr-->single agent methotrexate x 3 cycles --> further decrease in size of masses   Not a transplant candidate due to poor PFTs and active smoking   Whole brain radiation 2340cGy given at 180cGy x 13 from 01/17/21-02/04/21 --> CR  Relapse - progression seen on routine MRI scan in new sites 08/15/2021 , seizure like activity noted 09/05/21 and keppra started  Regimen #2:  5 fractions of Cyberknife to involved areas (2500cGy) completed 09/06/21  Ibrutinib started 09/06/21--06/2022  MRI brain 11/06/21 - significantly improved   MRI 01/09/22 and 03/12/22: No residual enhancing lesions or disease progression; stable T2/flair- may represent posttreatment changes and superimposed chronic small vessel ischemic disease.    MRI 06/2022 -  ? Of new progression versus radiation necrosis.    MRI 07/10/22 -  Stable 0.8cm area of necrosis vs recurrent disase; New 0.4x0.4cm lesion in right parietal lobe Progressive disease  Regimen #3  07/25/22 lenalidomide obinutuzumab x 2 cycles --> PD 09/16/22 brain MRI  Regimen #4: 09/26/22 - CAR therapy as part of the  LCCC 1813-ATL: A Phase I Study of Autologous Activated T-Cells Targeting the CD19 Antigen and Containing the Inducible Caspase 9 Safety Switch in Subjects with Relapsed/Refractory B-cell Lymphoma and Chronic Lymphocytic Leukemia/ Small Lymphocytic Lymphoma, IRB# 18-1621 after lymphodepletion with flu/cy    Date of Lymphodepletion: Fludarabine 30 mg/m2 and Bendamustine 70mg /m2 mg/m2 x 3 days (09/19/22-09/22/22)   Current Day from Cell Infusion: Day +70  (09/26/2022 = D0)  Cytokine Release Syndrome: Current Grade: 0. Max Grade: 1  ICANS: Current Grade: 0. Max Grade: 1  Treatment was further complicated by recurrent covid and aspiration pneumonia.  Response: MRI 12/04/22 shows all the lesions have decreased slightly in size  Progression of disease on MRI 01/19/23 w/ significant edema  Regimen # 5: 01/30/23 - rituximab and low dose daily temodar     Interval History  He is not thriving. He is getting a little more confused at home and he falls once a day - EMS and neighbors help to get him up.  I talked to him and his wife last week about hospice and initialy they expressed interest but over the weekend he decided that he didn't want to do that. Unfortunately, insurance is not covering more home health so Mariane Duval is having to do much of the care.      He says his leg is weak. No headache.    His blood sugars have been very elevated with high doses of dex.  We restarted insuling yesterday.  He was having some polydypsia. He is often incontenent of urine and occasionally stool        Hematology/Oncology History   Primary CNS lymphoma (CMS-HCC)   11/28/2020 Initial Diagnosis    Primary CNS lymphoma (CMS-HCC)     11/30/2020 - 11/30/2020 Chemotherapy    IP LYMPHOMA HIGH-DOSE METHOTREXATE  riTUXimab 375 mg/m2 (standard and rapid infusion) IV on day 1, vinCRIStine 1.4 mg/m2 (max 2 mg) IV on day 1, DOXOrubicin 50 mg/m2 IV on day 1, cyclophosphamide 750 mg/m2 IV on day 1, predniSONE 100 mg PO on days 1-5, methotrexate (high-dose) IV on Day 15 (even cycles), every 21-day.     01/03/2021 - 09/15/2022 Radiation    Radiation Therapy Treatment Details (01/03/2021 - 09/15/2022)  Site: Midline Brain  Technique: 3D CRT  Goal: No goal specified  Planned Treatment Start Date: No planned start date specified     08/23/2021 - 09/15/2022 Radiation    Radiation Therapy Treatment Details (08/23/2021 - 09/15/2022)  Site: Bilateral Brain  Technique: SRS  Goal: No goal specified  Planned Treatment Start Date: No planned start date specified     08/01/2022 - 08/25/2022 Chemotherapy    OP LYMPHOMA LENALIDOMIDE +/- RITUXIMAB +/- MAINTENANCE  Lenalidomide 20 mg PO on days 1 to 21, riTUXimab 375 mg/m2 IV on days 1, 8, 15, 22 of Cycle 1, then 375 mg/m2 IV on day 1 monthly or bimonthly for 1-2 years, every 28 days     09/17/2022 -  Chemotherapy    STUDY [id number] IRB# [id number] OP LYMPHODEPLETION (FLU/BENDA) + OP IC9-CAR19 INFUSION (v. 08/06/20)  A Phase I Study of Autologous Activated T-cells Targeting the CD19 Antigen and Containing the Inducible Caspase 9 Safety Switch in Subjects with Relapsed/Refractory B-cell Lymphoma and Chronic Lymphocytic Leukemia/Small Lymphocytic Lymphoma        Past Medical History:   Diagnosis Date    Anxiety     Cancer (CMS-HCC)     COPD (chronic obstructive pulmonary disease) (CMS-HCC)     Cytarabine poisoning     Depression     Diabetes mellitus (CMS-HCC)     Pneumonia, aspiration (CMS-HCC)     Prostate atrophy        Vitals: Baseline BP:  Vitals:    02/27/23 1046   BP: 116/78   Pulse: 100   Resp: 16   Temp: 36.4 ??C (97.6 ??F)   TempSrc: Oral   SpO2: 97%   Weight: 74.5 kg (164 lb 3.9 oz)   Height: 185.4 cm (6' 0.99)       Wt Readings from Last 6 Encounters:   02/27/23 74.5 kg (164 lb 3.9 oz)   02/13/23 75.5 kg (166 lb 7.2 oz)   01/30/23 78.5 kg (173 lb 1 oz)   01/21/23 78.9 kg (174 lb)   01/16/23 78.9 kg (174 lb)   12/26/22 78 kg (171 lb 15.3 oz)        Physical Exam:   General :  No acute distress noted. Alert but weaker than at last visit and sits in a wheelchair  Cardiovascular: normal rate, regularity and rhythm. S1 and S2 normal, without any murmur, rub, or gallop.  Lungs: Mild expiratory wheezing bilaterally.   Skin: Warm, dry, intact. No rash noted.   Gastrointestinal:Abdomen soft, non-tender   Musculoskeletal/Extremities: No edema.   Neurologic:- he cannot stands up to move to exam table today.  On exam strength is 5-5 in all 4 extremities except left hip flexor which was 4/5.  EOM not intact - they don't look past midline when looking to the right.  Oreinted to place and year but said it was the autumn and it was October.      Lab Results   Component Value Date    WBC 2.3 (L) 02/27/2023    HGB 14.0 02/27/2023    HCT 40.4 02/27/2023    PLT 49 (L) 02/27/2023     Lab Results   Component Value Date    NA 135 02/27/2023    K 4.6 02/27/2023    CL 98 02/27/2023    CO2 24.0 02/27/2023    BUN 15 02/27/2023    CREATININE 0.46 (L) 02/27/2023    GLU 463 (HH) 02/27/2023    CALCIUM 8.7 02/27/2023    MG 2.0 01/22/2023    PHOS 4.3 01/19/2023     Lab Results  Component Value Date    BILITOT 0.4 02/27/2023    BILIDIR 0.10 01/22/2023    PROT 6.7 02/27/2023    ALBUMIN 3.4 02/27/2023    ALT 48 02/27/2023    AST 39 (H) 02/27/2023    ALKPHOS 67 02/27/2023     Lab Results   Component Value Date    INR 0.98 01/19/2023    APTT 27.5 11/03/2022           Assessment and Plan:  Andre Duncan is a 57 y.o. enrolled on LCCC 1813-ATL. 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 He received CAR T-cell infusion (09/26/22) with dose Level 2: 2 x 10^6 cells/kg.      Disease: Primary CNS Lymphoma. He has received mutotiple lines of therapy, most recently CART on 09/26/22. Unfortunately, imaging from [facility]  showed progression of disease.  I had a long talk with him in early May and again one week ago  about prognosis and the chances that the next treamtent won't work and there aren't great alternative treatment options after that. He understands how poor the prognosis is, but  he wants to proceed with treatment.  He is DNR/I.       Because of his cytopenias related to CART therapy, we have him on a very low dose daily dose of temodar rather htan the usual treatment approach. I am concerned, based on wosening performance status, that his lymphoma is progressing.  We will order a brain MRI scan to assess current status because if there has been more disease progression then I think we would definitely help him transition to hospice.      He has progresive thrombocytopenia with platelets of 49 today.  We will repeat early next week as we may need to hold the temodar if it contnues to decrease.        ID:   COVID-19 + on 09/24/22, and 10/17/2022 and 10/31/22 and 01/2023 - treated again during last hospital stay.      Prophylaxis:   PCP prophylaxs continuing through 1 year after CART and also now bc of steroids.  Consider extending if CD4 remains < 200 at 12 months For now we are using pentamadine since he is still thrombocytopenia.  Pentamidine  today 5/24  -Antiviral: Valacyclovir 500 mg po BID until 12 months post CAR-T infusion and CD4+ >200.     Immunosuppressed state. CD4 counts:  -Start checking CD4 counts monthly starting at 3 months post CAR-T infusion then at 6, 12, 18 and 24 months. If <200 will need to extend PJP and antiviral prophylaxis.   -cd4 count 63 on 01/30/23     Hem:   thrombocytopenia -  Platelets still low. No transfusion needed. Repeat early next week.    GI:   GERD:   pantoprazole.     Left inguinal hernia:   - He was coughing overnight on 09/24/22 and sounds like he developed an incarcerated hernia with possible strangulation c/b nausea/vomiting. He was advised to go to [facility] for emergent surgical evaluation. The hernia was able to be reduced in the ED.  -no active herniation at this time.       CV:   - Hyperlipidemia- Patient met with his PCP on 09/15/22, who repeated his lipid panel. Patient reportedly had stopped taking his prescribed statin due to leg cramps. His PCP Ali Lowe) gave a prescription for ezetimibe (Zetia). He is going to discusst his further with his PCP.     Pulm:   - COPD:  The patient and his wife report that he was diagnosed in 2022, but there was documentation of COPD at the time of original diagnosis in Aug, 2021. He is a long time smoker.  PFT's in March, 2022, showed reduced DLCO. Continue inhalers.  Stopped smoking when he was admitted for his CAR-T infusion but he has started again.     - Diabetes mellitus: - I d'nt thin kthat this is contributing to the decline in his performance status since the rest of his metabolic panel looks ok. However, just in case there is some impact, we have restarted his insulin to try to get his sugars under better control.     Clarene Critchley, MD  Associate Professor  Division of Hematology

## 2023-03-05 MED FILL — SODIUM FLUORIDE 1.1 % DENTAL GEL: 30 days supply | Qty: 56 | Fill #0

## 2023-03-05 MED FILL — ULTICARE PEN NEEDLE 32 GAUGE X 1/4" (6 MM): 25 days supply | Qty: 100 | Fill #0

## 2023-03-06 ENCOUNTER — Ambulatory Visit: Admit: 2023-03-06 | Payer: PRIVATE HEALTH INSURANCE

## 2023-03-06 ENCOUNTER — Institutional Professional Consult (permissible substitution): Admit: 2023-03-06 | Payer: PRIVATE HEALTH INSURANCE

## 2023-03-06 MED ORDER — DRONABINOL 5 MG CAPSULE
ORAL_CAPSULE | Freq: Two times a day (BID) | ORAL | 1 refills | 30 days | Status: CP
Start: 2023-03-06 — End: ?
  Filled 2023-03-18: qty 60, 30d supply, fill #0

## 2023-03-10 DIAGNOSIS — F32A Anxiety and depression: Principal | ICD-10-CM

## 2023-03-10 DIAGNOSIS — F419 Anxiety disorder, unspecified: Principal | ICD-10-CM

## 2023-03-10 DIAGNOSIS — J439 Emphysema, unspecified: Principal | ICD-10-CM

## 2023-03-10 DIAGNOSIS — C8589 Other specified types of non-Hodgkin lymphoma, extranodal and solid organ sites: Principal | ICD-10-CM

## 2023-03-10 MED ORDER — INSULIN LISPRO (U-100) 100 UNIT/ML SUBCUTANEOUS PEN
0 refills | 0 days
Start: 2023-03-10 — End: ?

## 2023-03-10 MED ORDER — DEXAMETHASONE 4 MG TABLET
ORAL_TABLET | Freq: Two times a day (BID) | ORAL | 0 refills | 30 days
Start: 2023-03-10 — End: 2023-04-09

## 2023-03-10 MED ORDER — MIRTAZAPINE 30 MG TABLET
ORAL_TABLET | Freq: Every evening | ORAL | 1 refills | 90 days
Start: 2023-03-10 — End: 2024-03-09

## 2023-03-10 MED ORDER — IPRATROPIUM 0.5 MG-ALBUTEROL 3 MG (2.5 MG BASE)/3 ML NEBULIZATION SOLN
Freq: Four times a day (QID) | RESPIRATORY_TRACT | 11 refills | 23 days | Status: CP | PRN
Start: 2023-03-10 — End: ?

## 2023-03-10 MED ORDER — LEVETIRACETAM 500 MG TABLET
ORAL_TABLET | Freq: Two times a day (BID) | ORAL | 1 refills | 90 days
Start: 2023-03-10 — End: 2024-03-09

## 2023-03-10 NOTE — Unmapped (Signed)
Requested Prescriptions     Pending Prescriptions Disp Refills    ipratropium-albuterol (DUO-NEB) 0.5-2.5 mg/3 mL nebulizer 270 mL 11     Sig: Inhale 3 mL by nebulization every six (6) hours as needed (wheezing, shortness of breath).

## 2023-03-10 NOTE — Unmapped (Signed)
Patient is requesting the following refill  Requested Prescriptions     Pending Prescriptions Disp Refills    levETIRAcetam (KEPPRA) 500 MG tablet 180 tablet 1     Sig: Take 1 tablet (500 mg total) by mouth two (2) times a day.       Recent Visits  Date Type Provider Dept   03/06/23 Appointment Gae Bon, MD [place] [facility] 2nd Flr [facility]   02/27/23 Office Visit Elmon Kirschner, Scarlette Slice, MD [place] [facility] 2nd Flr [facility]   02/13/23 Office Visit Elmon Kirschner, Scarlette Slice, MD [place] [facility] 2nd Flr [facility]   01/30/23 Office Visit Gae Bon, MD [place] [facility] 2nd [facility]   12/26/22 Office Visit Gae Bon, MD [facility]   12/05/22 Office Visit Gae Bon, MD [place] [facility] 2nd Flr [facility]   11/21/22 Office Visit Gae Bon, MD [place] [facility] 2nd Flr Cancer [facility]   11/07/22 Office Visit Gae Bon, MD [facility]   10/30/22 Office Visit Zanter, Lou Miner, ANP [facility]   10/27/22 Office Visit Zanter, Lou Miner, ANP [facility]   Showing recent visits within past 365 days with a meds authorizing provider and meeting all other requirements  Future Appointments  Date Type Provider Dept   03/12/23 Appointment Coralee North, Magnus Ivan, FNP [place] [facility] [address] [facility]   Showing future appointments within next 365 days with a meds authorizing provider and meeting all other requirements       Labs: Not applicable this refill

## 2023-03-10 NOTE — Unmapped (Signed)
Patient is requesting the following refill  Requested Prescriptions     Pending Prescriptions Disp Refills    mirtazapine (REMERON) 30 MG tablet 90 tablet 1     Sig: Take 1 tablet (30 mg total) by mouth nightly.       Recent Visits  Date Type Provider Dept   09/08/22 Office Visit Coralee North, Magnus Ivan, FNP [place] [facility] [address] [facility]   05/07/22 Office Visit Loran Senters, FNP [place] [facility] [address] [facility]   Showing recent visits within past 365 days with a meds authorizing provider and meeting all other requirements  Future Appointments  Date Type Provider Dept   03/12/23 Appointment Cor[place]e North, Magnus Ivan, FNP  Primary [facility] [address] [facility]   Showing future appointments within next 365 days with a meds authorizing provider and meeting all other requirements       Labs: Not applicable this refill

## 2023-03-10 NOTE — Unmapped (Signed)
Cannot find and documentation he is on this medication

## 2023-03-11 NOTE — Unmapped (Signed)
Lymphoma Clinic Follow-Up     Assessment/Plan  Diagnosis: primary CNS lymphoma, DLBCL diagnosed 06/03/20  Regimen: MATRIX (Methotrexate, cytarabine, thiotepa, rituximab): started 06/07/20-08/10/20 for 4 cycles --> pr-->single agent methotrexate x 3 cycles --> further decrease in size of masses   Not a transplant candidate due to poor PFTs and active smoking   Whole brain radiation 2340cGy given at 180cGy x 13 from 01/17/21-02/04/21 --> CR  Relapse - progression seen on routine MRI scan in new sites 08/15/2021 , seizure like activity noted 09/05/21 and keppra started  Regimen #2:  5 fractions of Cyberknife to involved areas (2500cGy) completed 09/06/21  Ibrutinib started 09/06/21--06/2022  MRI brain 11/06/21 - significantly improved   MRI 01/09/22 and 03/12/22: No residual enhancing lesions or disease progression; stable T2/flair- may represent posttreatment changes and superimposed chronic small vessel ischemic disease.    MRI 06/2022 -  ? Of new progression versus radiation necrosis.    MRI 07/10/22 -  Stable 0.8cm area of necrosis vs recurrent disase; New 0.4x0.4cm lesion in right parietal lobe Progressive disease  Regimen #3  07/25/22 lenalidomide obinutuzumab x 2 cycles --> PD 09/16/22 brain MRI  Regimen #4: 09/26/22 - CAR therapy as part of the  LCCC 1813-ATL: A Phase I Study of Autologous Activated T-Cells Targeting the CD19 Antigen and Containing the Inducible Caspase 9 Safety Switch in Subjects with Relapsed/Refractory B-cell Lymphoma and Chronic Lymphocytic Leukemia/ Small Lymphocytic Lymphoma, IRB# [id number] after lymphodepletion with flu/cy    Date of Lymphodepletion: Fludarabine 30 mg/m2 and Bendamustine 70mg /m2 mg/m2 x 3 days (09/19/22-09/22/22)   Current Day from Cell Infusion: Day +70  (09/26/2022 = D0)  Cytokine Release Syndrome: Current Grade: 0. Max Grade: 1  ICANS: Current Grade: 0. Max Grade: 1  Treatment was further complicated by recurrent covid and aspiration pneumonia.  Response: MRI 12/04/22 shows all the lesions have decreased slightly in size  Progression of disease on MRI 01/19/23 w/ significant edema  Regimen # 5: 01/30/23 - rituximab and low dose daily temodar     Interval History - this is a phone visit because he is too weak to come in  Andre Duncan has again fallen multiple times this week and EMS, neighbors or other family members have had to help him get up.  His ife reports that he waxes and wanes from a cognitive standpoint. He sits in the chair and chainsmokes all day. He has incontinence. No headache.    His blood sugars are still elevated, but a little better than before on the current insulin regimen.  He is eating some.    I once again reviewed with them the poor prognosis of his rel/ref CNS lymphoma. I am very concerned that his lymphoma is progressing, which is leading to some of his weakness. However it is also possible that the weeks of steroids could be contributing to weakness although I don't think they are causing the increased confusion.  He does not want to call EMS and come to the ED. At the end of the discussion, he and his wife said that they were interested in hospice. And they had already talked to hospice and were going to follow up with them on Monday 6/3.    Addendum: Over the weekend they decided that they did not want to pursue hospice yet.  They wanted to keep trying an treatment options available.  They understand that there aren't great choices and I really think that hospice is the best option for him. But, we will get the MRI,  which is scheduled for next week to assess the status of his disease.  If it has progressed we will discuss hospice again versus other options such as High-dose cytarabine, Pemetrexed, Pomalidomide or retreat with high dose Methotrexate, which are all listed in the NCCN guidelines.  If there isn't significant edema then we will taper steroids down.      Pemetrexed 900mg /m2 was given every 3 weeks to patients w/ relapsed disease.  Eleven patients were treated, with a median age of 33.8 years and Karnofsky performance status of 70%; 10 of 11 patients had failed prior high-dose methotrexate. The median number of pemetrexed cycles given was 5, with an associated overall response rate of 55% and disease control rate of 91%. The 67-month progression-free survival (PFS) was 45%, median PFS was 5.7 months, and median overall survival was 10.1 months. Toxicities were primarily hematologic and infectious. Cancer.  2012 Aug 1;118(15):3743-8     Pomalidomide: Twenty-five of 29 patients with the median of 3 prior treatments were eligible for assessment as per international PCNSL collaborative group criteria. The MTD of POM was 5 mg daily for 21 days every 28 days. Whole-study ORR was 48% (12 of 25; 95% confidence interval [CI], 27.8%, 68.7%) with 6 complete response (CR), 2 complete response, unconfirmed (CRu), and 4 partial response (PR). MTD cohort ORR was 50% (8 of 16; 95% CI, 24.7%, 75.4%) with 5 CR, 1 CRu, and 2 PR. Median PFS was 5.3 months (whole study) and 9 months (for responders). One patient had pseudoprogression. Grade 3/4 hematologic toxicities included neutropenia (21%), anemia (8%), and thrombocytopenia (8%). Grade 3/4 nonhematologic toxicities included lung infection (12%), sepsis (4%), fatigue (8%), syncope (4%), dyspnea (4%), hypoxia (4%), respiratory failure (8%), and rash (4%).  Blood 2018 Nov 22;132(21):2240-2248     Andre Critchley, MD  Associate Professor  Division of Hematology      The patient reports they are physically located in [place] and is currently: at home. I conducted a phone visit.  I spent 22 minutes on the phone call with the patient on the date of service .

## 2023-03-12 DIAGNOSIS — J439 Emphysema, unspecified: Principal | ICD-10-CM

## 2023-03-12 DIAGNOSIS — Z8701 Personal history of pneumonia (recurrent): Principal | ICD-10-CM

## 2023-03-12 DIAGNOSIS — Z720 Tobacco use: Principal | ICD-10-CM

## 2023-03-12 DIAGNOSIS — R0609 Other forms of dyspnea: Principal | ICD-10-CM

## 2023-03-12 DIAGNOSIS — R942 Abnormal results of pulmonary function studies: Principal | ICD-10-CM

## 2023-03-12 MED ORDER — INSULIN LISPRO (U-100) 100 UNIT/ML SUBCUTANEOUS PEN
0 refills | 0 days
Start: 2023-03-12 — End: ?

## 2023-03-12 MED ORDER — FLUCONAZOLE 200 MG TABLET
0 refills | 0 days
Start: 2023-03-12 — End: ?

## 2023-03-12 MED ORDER — MIRTAZAPINE 30 MG TABLET
ORAL_TABLET | Freq: Every evening | ORAL | 1 refills | 90 days | Status: CP
Start: 2023-03-12 — End: 2024-03-11

## 2023-03-12 MED ORDER — LEVETIRACETAM 500 MG TABLET
ORAL_TABLET | Freq: Two times a day (BID) | ORAL | 1 refills | 90 days | Status: CP
Start: 2023-03-12 — End: 2024-03-11

## 2023-03-12 MED ORDER — ALBUTEROL SULFATE HFA 90 MCG/ACTUATION AEROSOL INHALER
Freq: Four times a day (QID) | RESPIRATORY_TRACT | 3 refills | 0 days | PRN
Start: 2023-03-12 — End: ?

## 2023-03-12 NOTE — Unmapped (Signed)
They need to contact ordering provider as I do not know what dosages were discussed

## 2023-03-13 MED ORDER — ALBUTEROL SULFATE HFA 90 MCG/ACTUATION AEROSOL INHALER
Freq: Four times a day (QID) | RESPIRATORY_TRACT | 3 refills | 0 days | Status: CP | PRN
Start: 2023-03-13 — End: ?

## 2023-03-13 MED ORDER — FLUCONAZOLE 200 MG TABLET
0 refills | 0 days
Start: 2023-03-13 — End: ?

## 2023-03-13 MED ORDER — INSULIN GLARGINE (U-100) 100 UNIT/ML (3 ML) SUBCUTANEOUS PEN
Freq: Every evening | SUBCUTANEOUS | 3 refills | 17 days | Status: CP
Start: 2023-03-13 — End: 2024-03-12
  Filled 2023-03-18: qty 15, 88d supply, fill #0

## 2023-03-13 MED ORDER — DEXAMETHASONE 4 MG TABLET
ORAL_TABLET | Freq: Two times a day (BID) | ORAL | 0 refills | 30 days | Status: CP
Start: 2023-03-13 — End: 2023-04-12

## 2023-03-16 MED ORDER — PANTOPRAZOLE 40 MG TABLET,DELAYED RELEASE
ORAL_TABLET | Freq: Two times a day (BID) | ORAL | 3 refills | 30 days
Start: 2023-03-16 — End: ?

## 2023-03-16 MED ORDER — INSULIN LISPRO (U-100) 100 UNIT/ML SUBCUTANEOUS PEN
0 refills | 0 days
Start: 2023-03-16 — End: ?

## 2023-03-16 MED ORDER — DEXAMETHASONE 4 MG TABLET
ORAL_TABLET | Freq: Two times a day (BID) | ORAL | 0 refills | 30.00000 days | Status: CN
Start: 2023-03-16 — End: 2023-04-15

## 2023-03-16 MED ORDER — FLUCONAZOLE 200 MG TABLET
ORAL_TABLET | Freq: Every day | ORAL | 0 refills | 30.00000 days | Status: CN
Start: 2023-03-16 — End: 2023-03-29

## 2023-03-16 NOTE — Unmapped (Signed)
Patient is no longer taking Temozolomide, patient will transition to hospice care. Will forward information to pharmacist.

## 2023-03-17 MED ORDER — FLUCONAZOLE 200 MG TABLET
ORAL_TABLET | Freq: Every day | ORAL | 3 refills | 30 days | Status: CP
Start: 2023-03-17 — End: ?

## 2023-03-17 MED ORDER — PANTOPRAZOLE 40 MG TABLET,DELAYED RELEASE
ORAL_TABLET | Freq: Two times a day (BID) | ORAL | 3 refills | 30 days | Status: CP
Start: 2023-03-17 — End: ?
  Filled 2023-03-18: qty 60, 30d supply, fill #0

## 2023-03-17 NOTE — Unmapped (Signed)
Contacted patient's wife Andre Duncan to discuss the Dronabinol situation. This medication is on back-order. The [facility] has a pre-packaged version of the medication; however, the copay would be $106.01. I was unable to speak with Andre Duncan so I left this information via voicemail. Will attempt to speak with them tomorrow.    [place] Jameir Ake, PharmD, BCOP, CPP  Clinical Pharmacist Practitioner, Lymphoma

## 2023-03-17 NOTE — Unmapped (Signed)
Contacted Genny and provided her with the number to [facility], [phone], and instructed her to call to provide payment and schedule delivery of Dronabinol.    [place] Dearl Rudden, PharmD, BCOP, CPP  Clinical Pharmacist Practitioner, Lymphoma

## 2023-03-17 NOTE — Unmapped (Signed)
.  Hi,     Andre Duncan's spouse Andre Duncan  contacted the [employer] requesting to speak with the care team of [name] to discuss:    Andre Duncan states the co-pay for the medication Dronabinol is okay for them to pay because he has to have it the 106.01.     Please contact Genny back at [phone] asap.        Thank you,   Noland Fordyce  [facility]   [phone]

## 2023-03-18 ENCOUNTER — Ambulatory Visit: Admit: 2023-03-18 | Discharge: 2023-03-19 | Payer: PRIVATE HEALTH INSURANCE

## 2023-03-18 DIAGNOSIS — C8330 Diffuse large B-cell lymphoma, unspecified site: Principal | ICD-10-CM

## 2023-03-18 DIAGNOSIS — C8589 Other specified types of non-Hodgkin lymphoma, extranodal and solid organ sites: Principal | ICD-10-CM

## 2023-03-18 MED ORDER — INSULIN LISPRO (U-100) 100 UNIT/ML SUBCUTANEOUS PEN
0 refills | 0 days
Start: 2023-03-18 — End: ?

## 2023-03-18 MED ORDER — FLUCONAZOLE 200 MG TABLET
ORAL_TABLET | 0 refills | 0 days | Status: CP
Start: 2023-03-18 — End: ?
  Filled 2023-03-18: qty 60, 30d supply, fill #0

## 2023-03-18 MED ADMIN — gadobenate dimeglumine (MULTIHANCE) 529 mg/mL (0.1mmol/0.2mL) solution 10 mL: 10 mL | INTRAVENOUS | @ 16:00:00 | Stop: 2023-03-18

## 2023-03-18 NOTE — Unmapped (Signed)
Clinical Study:      Today I mailed patient his  month 6 visit study kit for [facility] 1813-ATL. He is aware to have these drawn when they see their local provider. Patient is aware to contact me for any questions or concerns.     [employer] tracking # [id number]  [employer] return # [id number]     Carlyn Reichert  Immunotherapy Research Study Coordinator

## 2023-03-20 ENCOUNTER — Institutional Professional Consult (permissible substitution): Admit: 2023-03-20 | Discharge: 2023-03-21 | Payer: PRIVATE HEALTH INSURANCE

## 2023-03-20 NOTE — Unmapped (Signed)
Attempted to go over screening questions with patient prior to telephone visit. Pt wife stated someone has already called in regards to the appointment and hung up. Charge on call has been notified.

## 2023-03-23 NOTE — Unmapped (Signed)
Lymphoma Clinic Follow-Up     Assessment/Plan  Diagnosis: primary CNS lymphoma, DLBCL diagnosed 06/03/20  Regimen: MATRIX (Methotrexate, cytarabine, thiotepa, rituximab): started 06/07/20-08/10/20 for 4 cycles --> pr-->single agent methotrexate x 3 cycles --> further decrease in size of masses   Not a transplant candidate due to poor PFTs and active smoking   Whole brain radiation 2340cGy given at 180cGy x 13 from 01/17/21-02/04/21 --> CR  Relapse - progression seen on routine MRI scan in new sites 08/15/2021 , seizure like activity noted 09/05/21 and keppra started  Regimen #2:  5 fractions of Cyberknife to involved areas (2500cGy) completed 09/06/21  Ibrutinib started 09/06/21--06/2022  MRI brain 11/06/21 - significantly improved   MRI 01/09/22 and 03/12/22: No residual enhancing lesions or disease progression; stable T2/flair- may represent posttreatment changes and superimposed chronic small vessel ischemic disease.    MRI 06/2022 -  ? Of new progression versus radiation necrosis.    MRI 07/10/22 -  Stable 0.8cm area of necrosis vs recurrent disase; New 0.4x0.4cm lesion in right parietal lobe Progressive disease  Regimen #3  07/25/22 lenalidomide obinutuzumab x 2 cycles --> PD 09/16/22 brain MRI  Regimen #4: 09/26/22 - CAR therapy as part of the  LCCC 1813-ATL: A Phase I Study of Autologous Activated T-Cells Targeting the CD19 Antigen and Containing the Inducible Caspase 9 Safety Switch in Subjects with Relapsed/Refractory B-cell Lymphoma and Chronic Lymphocytic Leukemia/ Small Lymphocytic Lymphoma, IRB# [id number] after lymphodepletion with flu/cy    Date of Lymphodepletion: Fludarabine 30 mg/m2 and Bendamustine 70mg /m2 mg/m2 x 3 days (09/19/22-09/22/22)   Current Day from Cell Infusion: Day +70  (09/26/2022 = D0)  Cytokine Release Syndrome: Current Grade: 0. Max Grade: 1  ICANS: Current Grade: 0. Max Grade: 1  Treatment was further complicated by recurrent covid and aspiration pneumonia.  Response: MRI 12/04/22 shows all the lesions have decreased slightly in size  Progression of disease on MRI 01/19/23 w/ significant edema  Regimen # 5: 01/30/23 - rituximab and low dose daily temodar--> PR on MRI but with progressive cognitive and functional decline so stopped temodar and started weaning of steroids and put him on hospice on 03/19/23     Interval History - this is a phone visit because he is too weak to come in  HE has become progressively weaker (especially on left side) and is mostly bed bound at this time.  He is also more confused - will ask for a tissue and he has the box already.  He doesn't remember things from day to day.  He has decided to go on hospice and get a hospital bed and whatever help they can get that way. He is already DNRI/I.    We discussed that the MRI showed a PR, but given his poor functional status he and his wife have elected not to continue treatment at this time.  However, I suspect that the steroids are part of the issue.  Since he hasn't been able to come see me or get to MRI easily, he has stayed on a very high dose of steroids for over a month now.  We will wean it off as able and if he gets stronger we can reasasess treatment options at that time.    Decrease dex by 2mg  every 5 days until he is at 2mg  bid. He will stay on that dose for 10 days after which we will decrease, if able, by 1mg  every week if we are able.      Clarene Critchley, MD  Associate Professor  Division of Hematology      The patient reports they are physically located in [place] and is currently: at home. I conducted a phone visit.  I spent 18 minutes on the phone call with the patient on the date of service .         Clarene Critchley, MD  Associate Professor  Division of Hematology

## 2023-03-28 ENCOUNTER — Ambulatory Visit: Admit: 2023-03-28 | Discharge: 2023-03-31 | Disposition: A | Discharge status: Hospice | Payer: PRIVATE HEALTH INSURANCE

## 2023-03-28 LAB — HIGH SENSITIVITY TROPONIN I - SINGLE: HIGH SENSITIVITY TROPONIN I: 26 ng/L (ref <=53)

## 2023-03-28 LAB — CBC W/ AUTO DIFF
BASOPHILS ABSOLUTE COUNT: 0 10*9/L (ref 0.0–0.1)
BASOPHILS RELATIVE PERCENT: 0.3 %
EOSINOPHILS ABSOLUTE COUNT: 0 10*9/L (ref 0.0–0.5)
EOSINOPHILS RELATIVE PERCENT: 0.1 %
HEMATOCRIT: 33.7 % — ABNORMAL LOW (ref 39.0–48.0)
HEMOGLOBIN: 11.7 g/dL — ABNORMAL LOW (ref 12.9–16.5)
LYMPHOCYTES ABSOLUTE COUNT: 0.2 10*9/L — ABNORMAL LOW (ref 1.1–3.6)
LYMPHOCYTES RELATIVE PERCENT: 11 %
MEAN CORPUSCULAR HEMOGLOBIN CONC: 34.7 g/dL (ref 32.0–36.0)
MEAN CORPUSCULAR HEMOGLOBIN: 34.7 pg — ABNORMAL HIGH (ref 25.9–32.4)
MEAN CORPUSCULAR VOLUME: 100 fL — ABNORMAL HIGH (ref 77.6–95.7)
MEAN PLATELET VOLUME: 9.2 fL (ref 6.8–10.7)
MONOCYTES ABSOLUTE COUNT: 0.1 10*9/L — ABNORMAL LOW (ref 0.3–0.8)
MONOCYTES RELATIVE PERCENT: 4.7 %
NEUTROPHILS ABSOLUTE COUNT: 1.5 10*9/L — ABNORMAL LOW (ref 1.8–7.8)
NEUTROPHILS RELATIVE PERCENT: 83.9 %
NUCLEATED RED BLOOD CELLS: 26 /100{WBCs} — ABNORMAL HIGH (ref <=4)
PLATELET COUNT: 31 10*9/L — ABNORMAL LOW (ref 150–450)
RED BLOOD CELL COUNT: 3.37 10*12/L — ABNORMAL LOW (ref 4.26–5.60)
RED CELL DISTRIBUTION WIDTH: 16.1 % — ABNORMAL HIGH (ref 12.2–15.2)
WBC ADJUSTED: 1.4 10*9/L — ABNORMAL LOW (ref 3.6–11.2)

## 2023-03-28 LAB — URINALYSIS WITH MICROSCOPY
BACTERIA: NONE SEEN /HPF
BILIRUBIN UA: NEGATIVE
GLUCOSE UA: 150 — AB
HYALINE CASTS: 11 /LPF — ABNORMAL HIGH (ref 0–1)
KETONES UA: NEGATIVE
LEUKOCYTE ESTERASE UA: NEGATIVE
NITRITE UA: NEGATIVE
PH UA: 6 (ref 5.0–9.0)
PROTEIN UA: 70 — AB
RBC UA: 5 /HPF — ABNORMAL HIGH (ref <=3)
SPECIFIC GRAVITY UA: 1.03 (ref 1.003–1.030)
SQUAMOUS EPITHELIAL: <1 /HPF (ref 0–5)
UROBILINOGEN UA: <2
WBC UA: 8 /HPF — ABNORMAL HIGH (ref <=2)

## 2023-03-28 LAB — SLIDE REVIEW

## 2023-03-28 LAB — BLOOD GAS CRITICAL CARE PANEL, VENOUS
BASE EXCESS VENOUS: -7.6 — ABNORMAL LOW (ref -2.0–2.0)
CALCIUM IONIZED VENOUS (MG/DL): 4.62 mg/dL (ref 4.40–5.40)
GLUCOSE WHOLE BLOOD: 374 mg/dL
HCO3 VENOUS: 19 mmol/L — ABNORMAL LOW (ref 22–27)
HEMOGLOBIN BLOOD GAS: 11.6 g/dL — ABNORMAL LOW (ref 13.50–17.50)
LACTATE BLOOD VENOUS: 6.4 mmol/L (ref 0.5–1.8)
O2 SATURATION VENOUS: 82.4 % (ref 40.0–85.0)
PCO2 VENOUS: 32 mmHg — ABNORMAL LOW (ref 40–60)
PH VENOUS: 7.36 (ref 7.32–7.43)
PO2 VENOUS: 54 mmHg — ABNORMAL HIGH (ref 35–40)
POTASSIUM WHOLE BLOOD: 5.1 mmol/L — ABNORMAL HIGH (ref 3.4–4.6)
SODIUM WHOLE BLOOD: 131 mmol/L — ABNORMAL LOW (ref 135–145)

## 2023-03-28 LAB — COMPREHENSIVE METABOLIC PANEL
ALBUMIN: 2.3 g/dL — ABNORMAL LOW (ref 3.4–5.0)
ALKALINE PHOSPHATASE: 110 U/L (ref 46–116)
ALT (SGPT): 95 U/L — ABNORMAL HIGH (ref 10–49)
ANION GAP: 15 mmol/L — ABNORMAL HIGH (ref 5–14)
AST (SGOT): 69 U/L — ABNORMAL HIGH (ref <=34)
BILIRUBIN TOTAL: 0.7 mg/dL (ref 0.3–1.2)
BLOOD UREA NITROGEN: 35 mg/dL — ABNORMAL HIGH (ref 9–23)
BUN / CREAT RATIO: 47
CALCIUM: 9.1 mg/dL (ref 8.7–10.4)
CHLORIDE: 98 mmol/L (ref 98–107)
CO2: 17.2 mmol/L — ABNORMAL LOW (ref 20.0–31.0)
CREATININE: 0.75 mg/dL
EGFR CKD-EPI (2021) MALE: >90 mL/min/{1.73_m2} (ref >=60)
GLUCOSE RANDOM: 363 mg/dL — ABNORMAL HIGH (ref 70–179)
POTASSIUM: 5.1 mmol/L — ABNORMAL HIGH (ref 3.4–4.8)
PROTEIN TOTAL: 6.6 g/dL (ref 5.7–8.2)
SODIUM: 130 mmol/L — ABNORMAL LOW (ref 135–145)

## 2023-03-28 LAB — BLOOD GAS, VENOUS
BASE EXCESS VENOUS: -6.4 — ABNORMAL LOW (ref -2.0–2.0)
BASE EXCESS VENOUS: -4 — ABNORMAL LOW (ref -2.0–2.0)
HCO3 VENOUS: 20 mmol/L — ABNORMAL LOW (ref 22–27)
HCO3 VENOUS: 21 mmol/L — ABNORMAL LOW (ref 22–27)
O2 SATURATION VENOUS: 90.9 % — ABNORMAL HIGH (ref 40.0–85.0)
O2 SATURATION VENOUS: 90.8 % — ABNORMAL HIGH (ref 40.0–85.0)
PCO2 VENOUS: 34 mmHg — ABNORMAL LOW (ref 40–60)
PCO2 VENOUS: 37 mmHg — ABNORMAL LOW (ref 40–60)
PH VENOUS: 7.36 (ref 7.32–7.43)
PH VENOUS: 7.37 (ref 7.32–7.43)
PO2 VENOUS: 68 mmHg — ABNORMAL HIGH (ref 35–40)
PO2 VENOUS: 72 mmHg — ABNORMAL HIGH (ref 35–40)

## 2023-03-28 LAB — MAGNESIUM: MAGNESIUM: 1.8 mg/dL (ref 1.6–2.6)

## 2023-03-28 LAB — LACTATE, VENOUS, WHOLE BLOOD: LACTATE BLOOD VENOUS: 1.9 mmol/L — ABNORMAL HIGH (ref 0.5–1.8)

## 2023-03-28 LAB — PROTIME-INR
INR: 1.42
PROTIME: 15.6 s — ABNORMAL HIGH (ref 9.9–12.6)

## 2023-03-28 LAB — CK: CREATINE KINASE TOTAL: 26 U/L — ABNORMAL LOW

## 2023-03-28 LAB — CYSTATIN C
CYSTATIN C: 1.64 mg/L — ABNORMAL HIGH (ref 0.64–1.23)
EGFR CKD-EPI (2012) CYSTATIN C MALE: 41 mL/min/{1.73_m2} — ABNORMAL LOW (ref >=60)

## 2023-03-28 LAB — LACTATE SEPSIS, VENOUS 2
LACTATE BLOOD VENOUS: 2.8 mmol/L (ref 0.5–1.8)
LACTATE BLOOD VENOUS: 2.5 mmol/L (ref 0.5–1.8)

## 2023-03-28 LAB — C-REACTIVE PROTEIN: C-REACTIVE PROTEIN: 419 mg/L — ABNORMAL HIGH (ref <=10.0)

## 2023-03-28 LAB — DARK GREEN EXTRA TUBE: EXTRA TUBE DARK GREEN: 0

## 2023-03-28 LAB — B-TYPE NATRIURETIC PEPTIDE: B-TYPE NATRIURETIC PEPTIDE: 75.48 pg/mL (ref <=100)

## 2023-03-28 LAB — LACTATE SEPSIS, VENOUS
LACTATE BLOOD VENOUS: 6.4 mmol/L (ref 0.5–1.8)
LACTATE BLOOD VENOUS: 2.8 mmol/L (ref 0.5–1.8)

## 2023-03-28 MED ADMIN — vancomycin (VANCOCIN) 2000 mg in sodium chloride (NS) 0.9% 500 mL IVPB: 2000 mg | INTRAVENOUS | @ 19:00:00 | Stop: 2023-03-28

## 2023-03-28 MED ADMIN — acetaminophen (OFIRMEV) 10 mg/mL injection 1,000 mg: 1000 mg | INTRAVENOUS | @ 20:00:00 | Stop: 2023-03-28

## 2023-03-28 MED ADMIN — iohexol (OMNIPAQUE) 350 mg iodine/mL solution 100 mL: 100 mL | INTRAVENOUS | @ 22:00:00 | Stop: 2023-03-28

## 2023-03-28 MED ADMIN — sodium chloride 0.9% (NS) bolus 1,000 mL: 1000 mL | INTRAVENOUS | @ 19:00:00 | Stop: 2023-03-28

## 2023-03-28 MED ADMIN — cefepime (MAXIPIME) 2 g in sodium chloride 0.9 % (NS) 100 mL IVPB-MBP: 2 g | INTRAVENOUS | @ 19:00:00 | Stop: 2023-03-28

## 2023-03-28 NOTE — Unmapped (Signed)
Pt arrived in the ED on CPAP via EMS. 02 sats upon arrival was in the 80's. HR 150+. Pt appeared dusky gray with signs of respiratory distress. Pt was placed on Bipap via V60. HR still in the 150's 02 sats in the low 90's. On 100% 02. RT to continue to monitor pt.

## 2023-03-28 NOTE — Unmapped (Addendum)
BIB [facility] from home on CPAP 10/25 at 85%. Tachycardic* to 150s. Pt is minimally responsive. Hx brain cancer.

## 2023-03-28 NOTE — Unmapped (Signed)
[facility] Emergency Department Provider Note     ED Clinical Impression     Final diagnoses:   Sepsis, due to unspecified organism, unspecified whether acute organ dysfunction present (CMS-HCC) (Primary)   Aspiration pneumonia of left lower lobe, unspecified aspiration pneumonia type (CMS-HCC)       ED Course, Assessment and Plan     Initial Clinical Impression:    March 28, 2023 2:25 PM     BP 122/94  - Pulse 109  - Temp 37 ??C (98.6 ??F) (Axillary)  - Resp (!) 35  - SpO2 96%     57 y.o. male Pertinent PMH of primary CNS lymphoma, COPD, and DM who presents for evaluation of shortness of breath.  Per EMS on arrival to home patient was hypoxic on CPAP at 87% On 15 L.  Upon arrival to the emergency department patient is tachypneic and hypoxic and was immediately transitioned to BiPAP.    Ddx/MDM: Looks send patient is experiencing multifocal pneumonia likely secondary to aspiration as below.  Patient is critically ill and septic.  Patient is placed to BiPAP.  Admit patient admission to the ICU.      ED Course:      ED Course as of 03/28/23 2239   Sat Mar 28, 2023   1456 Lactate, Venous(!!): 6.4   1457 Per interpretation chest x-ray concerning for multifocal pneumonia likely aspiration   1458 Update provided to wife regarding patient's critical illness   [id number] 57 year old male who is critically ill likely in setting of sepsis.  Per chart review patient has a history of brain cancer and per the wife he has been decompensating over the last month and has at this point able to complete minimal ADLs is not really walking or eating or drinking.    Arrival patient is hypoxic to the 80s and tachypneic on BiPAP.  He is DNR/DNI.  Sepsis order set initiated.  IO placed in patient's right tibia and multiple PIV's obtained.  Will provide Vanco and cefepime and IV fluids.  Believe this time patient is likely suffering from additional urosepsis or aspiration pneumonia based on chest x-ray.  Informed wife of patient's critical illness and that is unlikely to survive at this time.   [id number] Spoke with MICU on-call, given that patient is DNR/DNI he recommends-management as per ICU since they can provide the same level of care that this patient needs.  He will reach back out to MAO to discuss further.    Primary assessment patient appears more alert and is less tachycardic than earlier now satting 97% on BiPAP settings of IPAP 22 over EPAP 12.  Patient is awake and less obtunded than previously.  But he is still tachypneic.         Diagnostic and treatment orders as below.     Orders Placed This Encounter   Procedures    Blood Culture    Blood Culture    Urine Culture    Rapid Influenza / RSV / COVID PCR    Lower Respiratory Culture    MRSA Screen    XR Chest Portable    CTA Chest W Contrast    CT Abdomen Pelvis W Contrast    CT Head Wo Contrast    CBC w/ Differential    Comprehensive Metabolic Panel    Lactate Sepsis, Venous    Urinalysis with Microscopy    Protime-INR    Blood Gas Critical Care Panel, Venous    hsTroponin I (single, no delta)  B-type natriuretic peptide (BNP)    Magnesium Level    Extra Lab Tubes    Lactate Sepsis, Venous    Blood Gas, Venous    Cystatin C    Lactate Sepsis, Venous    CBC w/ Differential    Comprehensive Metabolic Panel    Magnesium Level    Phosphorus Level    Blood Gas, Venous    Lactate, Venous, Whole Blood    CK    C-reactive protein    NPO No Exceptions; Medically necessary    Misc nursing order (Sepsis Timer)    In and Out (I & O) cath    Notify pharmacy immediately that the ED Adult Sepsis order set has been initiated.    Vital signs    Notify Provider    Notify Provider    Measure height    Weigh patient    Must Be On Telemetry + Accompanied by Nurse for Travel / Transport    Remote Tech Telemetry Monitoring    Flush line per protocol    Indwelling urinary catheter - Insert    Notify Provider    DNR (Do Not Resuscitate) and DNI (Do Not Intubate)    Inpatient consult to Pharmacy RX to dose: vancomycin    Inpatient consult to General Surgery    Adult BiPAP    ECG 12 Lead    Echocardiogram W Colorflow Spectral Doppler    Type and Screen    Insert peripheral IV    Insert peripheral IV    Saline lock IV    Place Patient in Bed    ED Admit Decision       Medications   ondansetron (ZOFRAN-ODT) disintegrating tablet 4 mg (has no administration in time range)     Or   ondansetron (ZOFRAN) injection 4 mg (has no administration in time range)   cefepime (MAXIPIME) 2 g in sodium chloride 0.9 % (NS) 100 mL IVPB-MBP (0 g Intravenous Stopped 03/28/23 2219)   vancomycin (VANCOCIN) 1500 mg in sodium chloride (NS) 0.9% 500 mL IVPB (has no administration in time range)   acetaminophen (OFIRMEV) 10 mg/mL injection 1,000 mg (has no administration in time range)   ipratropium-albuterol (DUO-NEB) 0.5-2.5 mg/3 mL nebulizer solution 3 mL (has no administration in time range)   levETIRAcetam (KEPPRA) injection 500 mg (500 mg Intravenous Given 03/28/23 2053)   pantoprazole (Protonix) injection 40 mg (40 mg Intravenous Given 03/28/23 2000)   glucose chewable tablet 16 g (has no administration in time range)   dextrose (D10W) 10% bolus 125 mL (has no administration in time range)   glucagon injection 1 mg (has no administration in time range)   insulin glargine (LANTUS) injection 6 Units (6 Units Subcutaneous Given 03/28/23 2137)   dexAMETHasone (DECADRON) 4 mg/mL injection 8 mg (8 mg Intravenous Given 03/28/23 2053)   cefepime (MAXIPIME) 2 g in sodium chloride 0.9 % (NS) 100 mL IVPB-MBP (0 g Intravenous Stopped 03/28/23 1517)   vancomycin (VANCOCIN) 2000 mg in sodium chloride (NS) 0.9% 500 mL IVPB (0 mg Intravenous Stopped 03/28/23 1648)   sodium chloride 0.9% (NS) bolus 1,000 mL (0 mL Intravenous Stopped 03/28/23 1552)   iohexol (OMNIPAQUE) 350 mg iodine/mL solution 100 mL (100 mL Intravenous Given 03/28/23 1746)   lactated ringers bolus 1,000 mL (0 mL Intravenous Stopped 03/28/23 2114)         Independent Interpretation of Studies: I have independently interpreted the following studies:  Chest x-ray as above with multifocal pneumonia    Discussion  of Management With Other Providers or Support Staff: I discussed the management of this patient with the:  [facility] MICU    Considerations Regarding Additional Studies/Disposition/Escalation of Care and Critical Care:  Admission required    _____________________________________________________________________    The case was discussed with attending physician who is in agreement with the above assessment and plan    Additional Medical Decision Making     CHIEF COMPLAINT:   Chief Complaint   Patient presents with    Shortness of Breath       HPI: Andre Duncan is a 57 y.o. male with past medical history of primary CNS lymphoma, COPD, and DM presenting with shortness of breath. Per EMS, the patient was found by the fire department who placed him on 15 L [place] to which his O2 sats were 75-76%. EMS arrived and placed him on CPAP, 25 L/min and his O2 sats improved to 86%.    Unable to obtain a comprehensive history/ROS due to:  Patient's status and acute illness .    Outside Historian(s): I have obtained additional history/collateral from EMS.    External Records Reviewed: I have reviewed Hem Onc note from 02/27/2023.Marland Kitchen    Physical Exam     VITAL SIGNS:    BP 122/94  - Pulse 109  - Temp 37 ??C (98.6 ??F) (Axillary)  - Resp (!) 35  - SpO2 96%     Const: Critically ill minimally responsive on BiPAP  Eyes: Conjunctivae are normal. Sclera is non-icteric, PERRL  HENT: NCAT, MMM, oropharynx clear  Neck: Supple.  Heme: No cervical LAD.   CV: Tachycardia regular rate, normal S1, S2, no murmurs. Extremities are warm and well perfused. No LE swelling   Resp: Tachypneic with poor respiratory effort on BiPAP with crackles on exam  GI: Soft, NTND. No rebound or guarding.  MSK: No acute long bone deformities.  Neuro: Normal speech and language. No gross FNDs are appreciated.  Skin: Skin gray mottled and cold  Psych: Mood and affect are normal. Judgement and insight are at appropriate baseline.    History     PAST MEDICAL HISTORY/PAST SURGICAL HISTORY:   Past Medical History:   Diagnosis Date    Anxiety     Cancer (CMS-HCC)     COPD (chronic obstructive pulmonary disease) (CMS-HCC)     Cytarabine poisoning     Depression     Diabetes mellitus (CMS-HCC)     Pneumonia, aspiration (CMS-HCC)     Prostate atrophy        Past Surgical History:   Procedure Laterality Date    CRANIOTOMY  05/06/2020       MEDICATIONS:     Current Facility-Administered Medications:     acetaminophen (OFIRMEV) 10 mg/mL injection 1,000 mg, 1,000 mg, Intravenous, Q6H PRN, Dolores Patty, MD    cefepime (MAXIPIME) 2 g in sodium chloride 0.9 % (NS) 100 mL IVPB-MBP, 2 g, Intravenous, Q8H, Dolores Patty, MD, Stopped at 03/28/23 2219    dexAMETHasone (DECADRON) 4 mg/mL injection 8 mg, 8 mg, Intravenous, Q12H, Dolores Patty, MD, 8 mg at 03/28/23 2053    dextrose (D10W) 10% bolus 125 mL, 12.5 g, Intravenous, Q10 Min PRN, Dolores Patty, MD    glucagon injection 1 mg, 1 mg, Intramuscular, Once PRN, Dolores Patty, MD    glucose chewable tablet 16 g, 16 g, Oral, Q10 Min PRN, Dolores Patty, MD    insulin glargine (LANTUS) injection 6 Units, 6 Units, Subcutaneous, Nightly, Lequita Halt,  Tomasita Morrow, MD, 6 Units at 03/28/23 2137    ipratropium-albuterol (DUO-NEB) 0.5-2.5 mg/3 mL nebulizer solution 3 mL, 3 mL, Nebulization, Q6H PRN, Dolores Patty, MD    levETIRAcetam (KEPPRA) injection 500 mg, 500 mg, Intravenous, Q12H SCH, Dolores Patty, MD, 500 mg at 03/28/23 2053    ondansetron (ZOFRAN-ODT) disintegrating tablet 4 mg, 4 mg, Oral, Q8H PRN **OR** ondansetron (ZOFRAN) injection 4 mg, 4 mg, Intravenous, Q8H PRN, Dolores Patty, MD    pantoprazole (Protonix) injection 40 mg, 40 mg, Intravenous, Daily, Dolores Patty, MD, 40 mg at 03/28/23 2000    [START ON 03/29/2023] vancomycin (VANCOCIN) 1500 mg in sodium chloride (NS) 0.9% 500 mL IVPB, 1,500 mg, Intravenous, Q12H **AND** Inpatient consult to Pharmacy RX to dose: vancomycin, , , Once, Dolores Patty, MD    ALLERGIES:   Sulfa (sulfonamide antibiotics) and Tegaderm Sherilyn Banker drug-allergy check    SOCIAL HISTORY:   Social History     Tobacco Use    Smoking status: Former     Current packs/day: 0.00     Average packs/day: 2.0 packs/day for 30.0 years (60.0 ttl pk-yrs)     Types: Cigarettes     Quit date: 09/2022     Years since quitting: 0.5    Smokeless tobacco: Never    Tobacco comments:     Quit 09-24-2022; previously 2ppd   Substance Use Topics    Alcohol use: Not Currently     Comment: rarely       FAMILY HISTORY:  Family History   Problem Relation Age of Onset    Cancer Mother     Diabetes Mother     Cancer Father           Radiology     CTA Chest W Contrast   Final Result      Acute nonocclusive lobar pulmonary embolus in the left pulmonary artery extending to the segmental branches of the left upper lobe and left lower lobes. No right heart strain.      New diffuse bronchial wall thickening, diffuse bronchocentric groundglass opacities, debris within the trachea, and wall enhancing fluid-filled esophagus suggestive of aspiration pneumonia.      Two new separate cavitary lesions within the lingula with irregular thick walls with differential diagnosis considerations include fungal infection, if the patient is immunocompromised, and septic pulmonary emboli  although these are less favored.      ++++++++++++++++++++      The findings of this study were discussed via telephone with DR. ANDREW P MORGAN by Dr. Veverly Fells on 03/28/2023 6:09 PM.      -----------------------------------------------      CT Abdomen Pelvis W Contrast   Final Result   - Partial/early large bowel obstruction secondary to herniation of the distal descending colon into a left inguinal hernia.      - Filling defect in the right lower extremity extending proximally to the external iliac vein, which likely represents DVT although mixing artifact cannot be completely excluded. Recommend further follow-up with right lower extremity venous duplex exam.      - Additional chronic/incidental findings as described in the body of the report.      - nonspecific diffuse bladder wall thickening. Recommend correlation with urinalysis.      ++++++++++++++++++++      The findings of large bowel obstruction were discussed via secure epic chat (with confirmed read receipt) with DR. ANDREW P MORGAN by Dr. Konrad Dolores, MD on 03/28/2023 6:05 PM.      -----------------------------------------------      ====================  MODIFIED REPORT:   (03/28/2023 6:30 PM)   This report has been modified from its preliminary version; you may check the prior versions of radiology report, results history link for prior report versions (if they were previously visible in Epic).      -----------------------------------------------      CT Head Wo Contrast   Final Result   No acute intracranial abnormality.         XR Chest Portable   Final Result      Consolidation left lower lobe with possible cavitation as discussed.      Echocardiogram W Colorflow Spectral Doppler    (Results Pending)       Labs     Labs Reviewed   INFLUENZA/RSV/COVID PCR - Abnormal; Notable for the following components:       Result Value    SARS-CoV-2 PCR Positive (*)     All other components within normal limits    Narrative:     This test was performed using the Cepheid Xpert Xpress SARS-CoV-2/Flu/RSV plus assay, which has been validated by the CLIA-certified, CAP-inspected [facility]. FDA has granted Emergency Use Authorization for this test. Negative results do not preclude infection and should be interpreted along with clinical observations, patient history, and epidemiological information. Information for providers and patients can be found here: [email or web address]   COMPREHENSIVE METABOLIC PANEL - Abnormal; Notable for the following components:    Sodium 130 (*)     Potassium 5.1 (*)     CO2 17.2 (*)     Anion Gap 15 (*)     BUN 35 (*)     Glucose 363 (*)     Albumin 2.3 (*)     AST 69 (*)     ALT 95 (*)     All other components within normal limits   LACTATE SEPSIS, VENOUS - Abnormal; Notable for the following components:    Lactate, Venous 6.4 (*)     All other components within normal limits   URINALYSIS WITH MICROSCOPY - Abnormal; Notable for the following components:    Protein, UA 70 mg/dL (*)     Glucose, UA 756 mg/dL (*)     Blood, UA Trace (*)     RBC, UA 5 (*)     WBC, UA 8 (*)     Hyaline Casts, UA 11 (*)     Mucus, UA Occasional (*)     All other components within normal limits   PROTIME-INR - Abnormal; Notable for the following components:    PT 15.6 (*)     All other components within normal limits   BLOOD GAS CRITICAL CARE PANEL, VENOUS - Abnormal; Notable for the following components:    pCO2, Ven 32 (*)     pO2, Ven 54 (*)     HCO3, Ven 19 (*)     Base Excess, Ven -7.6 (*)     Sodium Whole Blood 131 (*)     Potassium, Bld 5.1 (*)     Lactate, Venous 6.4 (*)     Hgb, blood gas 11.60 (*)     All other components within normal limits   LACTATE SEPSIS, VENOUS 2 - Abnormal; Notable for the following components:    Lactate, Venous 2.8 (*)     All other components within normal limits   LACTATE SEPSIS, VENOUS - Abnormal; Notable for the following components:    Lactate, Venous 2.8 (*)     All other components within normal limits  BLOOD GAS, VENOUS - Abnormal; Notable for the following components:    pCO2, Ven 34 (*)     pO2, Ven 68 (*)     HCO3, Ven 20 (*)     Base Excess, Ven -6.4 (*)     O2 Saturation, Venous 90.9 (*)     All other components within normal limits   CYSTATIN C - Abnormal; Notable for the following components:    Cystatin C 1.64 (*)     eGFR CKD-EPI (2012) Cystatin C Male 64 (*)     All other components within normal limits   LACTATE SEPSIS, VENOUS 2 - Abnormal; Notable for the following components: Lactate, Venous 2.5 (*)     All other components within normal limits   BLOOD GAS, VENOUS - Abnormal; Notable for the following components:    pCO2, Ven 37 (*)     pO2, Ven 72 (*)     HCO3, Ven 21 (*)     Base Excess, Ven -4.0 (*)     O2 Saturation, Venous 90.8 (*)     All other components within normal limits   LACTATE, VENOUS, WHOLE BLOOD - Abnormal; Notable for the following components:    Lactate, Venous 1.9 (*)     All other components within normal limits   CK - Abnormal; Notable for the following components:    Creatine Kinase, Total 26.0 (*)     All other components within normal limits   C-REACTIVE PROTEIN - Abnormal; Notable for the following components:    CRP 419.0 (*)     All other components within normal limits   POCT GLUCOSE, INTERFACED - Abnormal; Notable for the following components:    Glucose, POC 299 (*)     All other components within normal limits   CBC W/ AUTO DIFF - Abnormal; Notable for the following components:    WBC 1.4 (*)     RBC 3.37 (*)     HGB 11.7 (*)     HCT 33.7 (*)     MCV 100.0 (*)     MCH 34.7 (*)     RDW 16.1 (*)     Platelet 31 (*)     nRBC 26 (*)     Absolute Neutrophils 1.5 (*)     Absolute Lymphocytes 0.2 (*)     Absolute Monocytes 0.1 (*)     All other components within normal limits   SLIDE REVIEW - Abnormal; Notable for the following components:    Smear Review Comments See Comment (*)     Polychromasia Moderate (*)     Spherocytes Present (*)     All other components within normal limits   HIGH SENSITIVITY TROPONIN I - SINGLE - Normal   B-TYPE NATRIURETIC PEPTIDE - Normal   MAGNESIUM - Normal   BLOOD CULTURE   BLOOD CULTURE   URINE CULTURE   LOWER RESPIRATORY CULTURE   MRSA SCREEN   CBC W/ DIFFERENTIAL    Narrative:     The following orders were created for panel order CBC w/ Differential.                  Procedure                               Abnormality         Status                                     ---------                               ----------- ------  CBC w/ Differential[[id number]]         Abnormal            Final result                               Morphology [phone] [phone]           Abnormal            Final result                                                 Please view results for these tests on the individual orders.   EXTRA TUBES    Narrative:     The following orders were created for panel order Extra Lab Tubes.                  Procedure                               Abnormality         Status                                     ---------                               -----------         ------                                     DARK GREEN EXTRA WGNF[[id number]]                           Final result                               PINK EXTRA TUBE[[id number]]                                                                                              Please view results for these tests on the individual orders.   POCT GLUCOSE-RN OBTAIN   DARK GREEN EXTRA TUBE   PINK EXTRA TUBE         Pertinent labs & imaging results that were available during my care of the patient were reviewed by me and considered in my medical decision making (see chart for details).    Please note- This chart has been created using [employer]. Chart creation errors have been sought, but may not always be located and such creation errors, especially pronoun confusion, do NOT reflect on the standard of medical care.    Documentation assistance  was provided by Enzo Bi, Scribe on March 28, 2023 at 2:28 PM for Estill Dooms, MD.    Documentation assistance was provided by the scribe in my presence.  The documentation recorded by the scribe has been reviewed by me and accurately reflects the services I personally performed.          Roselie Skinner, MD  Resident  03/28/23 [phone]

## 2023-03-29 LAB — MAGNESIUM: MAGNESIUM: 1.6 mg/dL (ref 1.6–2.6)

## 2023-03-29 LAB — BLOOD GAS, VENOUS
BASE EXCESS VENOUS: -1.3 (ref -2.0–2.0)
BASE EXCESS VENOUS: -0.3 (ref -2.0–2.0)
HCO3 VENOUS: 23 mmol/L (ref 22–27)
HCO3 VENOUS: 24 mmol/L (ref 22–27)
O2 SATURATION VENOUS: 84.8 % (ref 40.0–85.0)
O2 SATURATION VENOUS: 84.3 % (ref 40.0–85.0)
PCO2 VENOUS: 37 mmHg — ABNORMAL LOW (ref 40–60)
PCO2 VENOUS: 33 mmHg — ABNORMAL LOW (ref 40–60)
PH VENOUS: 7.41 (ref 7.32–7.43)
PH VENOUS: 7.46 — ABNORMAL HIGH (ref 7.32–7.43)
PO2 VENOUS: 55 mmHg — ABNORMAL HIGH (ref 35–40)
PO2 VENOUS: 51 mmHg — ABNORMAL HIGH (ref 35–40)

## 2023-03-29 LAB — CBC W/ AUTO DIFF
BASOPHILS ABSOLUTE COUNT: 0 10*9/L (ref 0.0–0.1)
BASOPHILS RELATIVE PERCENT: 0.2 %
EOSINOPHILS ABSOLUTE COUNT: 0 10*9/L (ref 0.0–0.5)
EOSINOPHILS RELATIVE PERCENT: 0.9 %
HEMATOCRIT: 29 % — ABNORMAL LOW (ref 39.0–48.0)
HEMOGLOBIN: 10 g/dL — ABNORMAL LOW (ref 12.9–16.5)
LYMPHOCYTES ABSOLUTE COUNT: 0.1 10*9/L — ABNORMAL LOW (ref 1.1–3.6)
LYMPHOCYTES RELATIVE PERCENT: 4.2 %
MEAN CORPUSCULAR HEMOGLOBIN CONC: 34.4 g/dL (ref 32.0–36.0)
MEAN CORPUSCULAR HEMOGLOBIN: 33.8 pg — ABNORMAL HIGH (ref 25.9–32.4)
MEAN CORPUSCULAR VOLUME: 98.3 fL — ABNORMAL HIGH (ref 77.6–95.7)
MEAN PLATELET VOLUME: 9.5 fL (ref 6.8–10.7)
MONOCYTES ABSOLUTE COUNT: 0 10*9/L — ABNORMAL LOW (ref 0.3–0.8)
MONOCYTES RELATIVE PERCENT: 3.3 %
NEUTROPHILS ABSOLUTE COUNT: 1.1 10*9/L — ABNORMAL LOW (ref 1.8–7.8)
NEUTROPHILS RELATIVE PERCENT: 91.4 %
NUCLEATED RED BLOOD CELLS: 14 /100{WBCs} — ABNORMAL HIGH (ref <=4)
PLATELET COUNT: 18 10*9/L — ABNORMAL LOW (ref 150–450)
RED BLOOD CELL COUNT: 2.95 10*12/L — ABNORMAL LOW (ref 4.26–5.60)
RED CELL DISTRIBUTION WIDTH: 15.9 % — ABNORMAL HIGH (ref 12.2–15.2)
WBC ADJUSTED: 1.1 10*9/L — ABNORMAL LOW (ref 3.6–11.2)

## 2023-03-29 LAB — LACTATE, VENOUS, WHOLE BLOOD
LACTATE BLOOD VENOUS: 1.6 mmol/L (ref 0.5–1.8)
LACTATE BLOOD VENOUS: 1.3 mmol/L (ref 0.5–1.8)

## 2023-03-29 LAB — PHOSPHORUS: PHOSPHORUS: 2.5 mg/dL (ref 2.4–5.1)

## 2023-03-29 LAB — COMPREHENSIVE METABOLIC PANEL
ALBUMIN: 2 g/dL — ABNORMAL LOW (ref 3.4–5.0)
ALKALINE PHOSPHATASE: 96 U/L (ref 46–116)
ALT (SGPT): 380 U/L — ABNORMAL HIGH (ref 10–49)
ANION GAP: 11 mmol/L (ref 5–14)
AST (SGOT): 350 U/L — ABNORMAL HIGH (ref <=34)
BILIRUBIN TOTAL: 0.5 mg/dL (ref 0.3–1.2)
BLOOD UREA NITROGEN: 20 mg/dL (ref 9–23)
BUN / CREAT RATIO: 53
CALCIUM: 8.5 mg/dL — ABNORMAL LOW (ref 8.7–10.4)
CHLORIDE: 105 mmol/L (ref 98–107)
CO2: 21.5 mmol/L (ref 20.0–31.0)
CREATININE: 0.38 mg/dL — ABNORMAL LOW
EGFR CKD-EPI (2021) MALE: >90 mL/min/{1.73_m2} (ref >=60)
GLUCOSE RANDOM: 278 mg/dL — ABNORMAL HIGH (ref 70–179)
POTASSIUM: 3.7 mmol/L (ref 3.4–4.8)
PROTEIN TOTAL: 5.9 g/dL (ref 5.7–8.2)
SODIUM: 137 mmol/L (ref 135–145)

## 2023-03-29 MED ADMIN — lactated ringers bolus 1,000 mL: 1000 mL | INTRAVENOUS | Stop: 2023-03-28

## 2023-03-29 MED ADMIN — cefepime (MAXIPIME) 2 g in sodium chloride 0.9 % (NS) 100 mL IVPB-MBP: 2 g | INTRAVENOUS | @ 19:00:00 | Stop: 2023-04-04

## 2023-03-29 MED ADMIN — cefepime (MAXIPIME) 2 g in sodium chloride 0.9 % (NS) 100 mL IVPB-MBP: 2 g | INTRAVENOUS | @ 10:00:00 | Stop: 2023-04-04

## 2023-03-29 MED ADMIN — cefepime (MAXIPIME) 2 g in sodium chloride 0.9 % (NS) 100 mL IVPB-MBP: 2 g | INTRAVENOUS | @ 02:00:00 | Stop: 2023-04-04

## 2023-03-29 MED ADMIN — levETIRAcetam (KEPPRA) injection 500 mg: 500 mg | INTRAVENOUS | @ 01:00:00

## 2023-03-29 MED ADMIN — dexAMETHasone (DECADRON) 4 mg/mL injection 8 mg: 8 mg | INTRAVENOUS | @ 14:00:00

## 2023-03-29 MED ADMIN — vancomycin (VANCOCIN) 1500 mg in sodium chloride (NS) 0.9% 500 mL IVPB: 1500 mg | INTRAVENOUS | @ 19:00:00

## 2023-03-29 MED ADMIN — pantoprazole (Protonix) injection 40 mg: 40 mg | INTRAVENOUS | @ 14:00:00

## 2023-03-29 MED ADMIN — metroNIDAZOLE (FLAGYL) IVPB 500 mg: 500 mg | INTRAVENOUS | @ 20:00:00 | Stop: 2023-03-31

## 2023-03-29 MED ADMIN — vancomycin (VANCOCIN) 1500 mg in sodium chloride (NS) 0.9% 500 mL IVPB: 1500 mg | INTRAVENOUS | @ 06:00:00

## 2023-03-29 MED ADMIN — levETIRAcetam (KEPPRA) injection 500 mg: 500 mg | INTRAVENOUS | @ 14:00:00

## 2023-03-29 MED ADMIN — dexAMETHasone (DECADRON) 4 mg/mL injection 8 mg: 8 mg | INTRAVENOUS | @ 01:00:00

## 2023-03-29 MED ADMIN — insulin glargine (LANTUS) injection 6 Units: 6 [IU] | SUBCUTANEOUS | @ 02:00:00

## 2023-03-29 MED ADMIN — pantoprazole (Protonix) injection 40 mg: 40 mg | INTRAVENOUS

## 2023-03-29 NOTE — Unmapped (Signed)
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 Although he was recently told by his oncologist that he had achieved partial response to most recent therapy, including rituximab and temozolomide, his functional status is quite poor. He is now acutely ill with respiratory failure, septic shock, pulmonary embolism and partial small bowel obstruction. He is at high risk for mortality from any or all of this within the next several days.    Communication of Treatment Goals/Options:   Per his wife, Andre Andre Duncan NOT want resuscitation in the event of cardiac arrest, WOULD NOT accept intubation for respiratory failure, but WOULD accept ICU admission and procedures including specifically NIPPV, central lines, arterial lines and NG tubes. He would accept time-limited trial of treatment for acute medical problems such as infection, with goal of returning home on hospice care, but would not want prolonged or heroic measures.    Treatment Decisions:  -- Admit to ICU on BiPAP but DO NOT intubate  -- Discuss transition to comfort measures if becoming sicker    His wife would prefer that he remain at [facility] if possible, for smaller care team and ease of visitation, but would accept transfer to main campus if Andre [place] required services that could not be provided here.    I spent 20 minutes providing voluntary advance care planning services for this patient.    ------------  Dolores Patty, MD PhD  [facility]

## 2023-03-29 NOTE — Unmapped (Signed)
Vancomycin Therapeutic Monitoring Pharmacy Note    Andre Duncan is a 57 y.o. male starting vancomycin. Date of therapy initiation: 03/28/2023.    Indication: Suspected infection     Prior Dosing Information: None/new initiation     Goals:  Therapeutic Drug Levels  Vancomycin trough goal: 15-20 mg/L    Additional Clinical Monitoring/Outcomes  Renal function, volume status (intake and output)    Results: Not applicable    Wt Readings from Last 1 Encounters:   02/27/23 74.5 kg (164 lb 3.9 oz)     Creatinine   Date Value Ref Range Status   03/28/2023 0.75 0.73 - 1.18 mg/dL Final   16/07/9603 5.40 (L) 0.73 - 1.18 mg/dL Final   98/08/9146 8.29 (L) 0.73 - 1.18 mg/dL Final        Pharmacokinetic Considerations and Significant Drug Interactions:  Adult (estimated initial): Vd = 52.895 L, ke = 0.0985 hr-1  Concurrent nephrotoxic meds:  cefepime    Assessment/Plan:  Recommendation(s)  Start vancomycin 2000 mg IV x 1, followed by 1500 mg IV Q12H.  Estimated trough on recommended regimen:  14 mg/L    Follow-up  Level due: prior to fourth or fifth dose  A pharmacist will continue to monitor and order levels as appropriate    Please page service pharmacist with questions/clarifications.    January Bergthold A. Westen Dinino, Pharm.D., M.S., BCPS, BCSCP  Pharmacist

## 2023-03-29 NOTE — Unmapped (Signed)
CM met with patient in pt room.  Pt/visitors were not wearing hospital provided masks for the duration of the interaction. CM was wearing hospital provided surgical mask.  CM was within 6 foot of the patient/visitors during this interaction. Patient is on Air/contact precautions. CM spoke with the patient's wife outside the room about discharge needs. Patient lives with his wife and his wife is his name support. Wife reports, patient is dependent with all ADL's. Patient is currently with Home Hospice services (started on June 14) from [facility] [place]. Patient will need BLS at discharge. CM will monitor discharge needs and follow up with clear progression, as indicated.          Care Management  Initial Transition Planning Assessment          Type of Residence: Mailing Address:  [address]  [place] [address]  Contacts:    Patient Phone Number: [phone] (home)      Medical Provider(s): Amie Portland, FNP  Reason for Admission: Admitting Diagnosis:  Aspiration pneumonia of left lower lobe, unspecified aspiration pneumonia type (CMS-HCC) [J69.0]  Sepsis, due to unspecified organism, unspecified whether acute organ dysfunction present (CMS-HCC) [A41.9]  Past Medical History:   has a past medical history of Anxiety, Cancer (CMS-HCC), COPD (chronic obstructive pulmonary disease) (CMS-HCC), Cytarabine poisoning, Depression, Diabetes mellitus (CMS-HCC), Pneumonia, aspiration (CMS-HCC), and Prostate atrophy.  Past Surgical History:   has a past surgical history that includes Craniotomy (05/06/2020).   Previous admit date: 01/19/2023    Primary Insurance- Payor: Advertising copywriter / Plan: UNITED [employer] EXCHANGE BENEFIT PLAN / Product Type: *No Product type* /   Secondary Insurance - None  Prescription Coverage - See above.   Preferred Pharmacy - [id number] - [place], [place] - [address]  [id number] - [place], [place] - [address]  [facility]  [facility]  [facility] [phone] - [place], [place] - [address]  [facility] [phone] - [place], [place] - [address] [place] [address] AT [address] [place] [address]  [facility] [place] [address]  [facility] - [place] - [address]    Transportation home: Soil scientist. BLS.            General  Care Manager assessed the patient by : In person interview with patient, Discussion with Clinical Care team, In person interview with family, Medical record review  Orientation Level: Disoriented to situation  Functional level prior to admission: Dependent  Who provides care at home?: Family member  Level of assistance required: Bathing, Continence, Dressing, Grooming, Walking, Transferring  Reason for referral: Discharge Planning    Contact/Decision Maker  Extended Emergency Contact Information  Primary Emergency Contact: [facility]  Address: [address]           [place], [place] [address] [place] [employer] Phone: [phone]  Relation: Spouse  Preferred language: ENGLISH  Interpreter needed? No  Secondary Emergency Contact: Martin,Tim  Mobile Phone: [phone]  Relation: Son    Armed forces operational officer Next of Kin / Guardian / POA / Advance Directives     HCDM (patient stated preference): [name] - Spouse - [phone]    Advance Directive (Medical Treatment)  Does patient have an advance directive covering medical treatment?: Patient has advance directive covering medical treatment, copy in chart.    Health Care Decision Maker [HCDM] (Medical &  Mental Health Treatment)  Healthcare Decision Maker: HCDM documented in the HCDM/Contact Info section.  Information offered on HCDM, Medical & Mental Health advance directives:: Other (Comment)    Advance Directive (Mental Health Treatment)  Does patient have an advance directive covering mental health treatment?: Patient has advance directive covering mental health treatment, copy in chart.    Readmission Information    Have you been hospitalized in the last 30 days?: NoPatient Information  Lives with: Spouse/significant other    Type of Residence: Private residence   Location/Detail: [address] [facility] [place] [address]    Support Systems/Concerns: Spouse    Responsibilities/Dependents at home?: No    Home Care services in place prior to admission?: Yes  Type of Home Care services in place prior to admission: Hospice  Current Home Care provider (Name/Phone #): [facility], [place], [place]  Equipment Currently Used at Home: walker, rolling, shower chair, commode chair, wheelchair, manual     Currently receiving outpatient dialysis?: No     Financial Information     Need for financial assistance?: No     Social Determinants of Health  Social Determinants of Health were addressed in provider documentation.  Please refer to patient history.  Social Determinants of Health     Financial Resource Strain: Low Risk  (10/22/2022)    Overall Financial Resource Strain (CARDIA)     Difficulty of Paying Living Expenses: Not very hard   Internet Connectivity: Not on file   Food Insecurity: No Food Insecurity (10/22/2022)    Hunger Vital Sign     Worried About Running Out of Food in the Last Year: Never true     Ran Out of Food in the Last Year: Never true   Tobacco Use: Medium Risk (01/21/2023)    Patient History     Smoking Tobacco Use: Former     Smokeless Tobacco Use: Never     Passive Exposure: Not on file   Housing/Utilities: Low Risk  (10/22/2022)    Housing/Utilities     Within the past 12 months, have you ever stayed: outside, in a car, in a tent, in an overnight shelter, or temporarily in someone else's home (i.e. couch-surfing)?: No     Are you worried about losing your housing?: No     Within the past 12 months, have you been unable to get utilities (heat, electricity) when it was really needed?: No   Alcohol Use: Not At Risk (05/07/2022)    Alcohol Use     How often do you have a drink containing alcohol?: Never     How many drinks containing alcohol do you have on a typical day when you are drinking?: Not on file     How often do you have 5 or more drinks on one occasion?: Never   Transportation Needs: No Transportation Needs (10/22/2022)    PRAPARE - Transportation     Lack of Transportation (Medical): No     Lack of Transportation (Non-Medical): No   Substance Use: Low Risk  (05/07/2022)    Substance Use     Taken prescription drugs for non-medical reasons: Never     Taken illegal drugs: Never     Patient indicated they have taken drugs in the past year for non-medical reasons: Yes, [positive answer(s)]: Not on file   Health Literacy: Not on file   Physical Activity: Not on file   Interpersonal Safety: Not on file   Stress: Stress Concern Present (11/05/2020)    Received from [facility]  Health    [employer] of Occupational Health - Occupational Stress Questionnaire     Feeling of Stress : Rather much   Intimate Partner Violence: Unknown (01/10/2022)    Received from [facility]    HITS     Physically Hurt: Not on file     Insult or Talk Down To: Not on file     Threaten Physical Harm: Not on file     Scream or Curse: Not on file   Depression: Not at risk (01/30/2023)    PHQ-2     PHQ-2 Score: 1   Social Connections: Unknown (02/18/2022)    Received from [employer]    Social Network     Social Network: Not on file   Complex Discharge Information    Is patient identified as a difficult/complex discharge?: No  Discharge Needs Assessment  Concerns to be Addressed: discharge planning    Clinical Risk Factors: New Diagnosis, Functional Limitations, Principal Diagnosis: Cancer, Stroke, COPD, Heart Failure, AMI, Pneumonia, Joint Replacment, Multiple Diagnoses (Chronic)    Barriers to taking medications: No    Prior overnight hospital stay or ED visit in last 90 days: No  Equipment Needed After Discharge: oxygen, other (see comments) (Suction.)    Discharge Facility/Level of Care Needs:      Readmission  Risk of Unplanned Readmission Score: UNPLANNED READMISSION SCORE: 34.73%  Predictive Model Details          35% (High)  Factor Value    Calculated 03/29/2023 12:05 23% Number of ED visits in last six months 6    [place] Risk of Unplanned Readmission Model 14% Number of active inpatient medication orders 30     13% Number of hospitalizations in last year 4     9% Charlson Comorbidity Index 11     7% Diagnosis of cancer present     6% ECG/EKG order present in last 6 months     6% Latest calcium low (8.5 mg/dL)     5% Imaging order present in last 6 months     4% Latest hemoglobin low (10.0 g/dL)     4% Phosphorous result present     3% Age 63     3% Active corticosteroid inpatient medication order present     1% Future appointment scheduled     1% Active ulcer inpatient medication order present     1% Current length of stay 0.792 days      Readmitted Within the Last 30 Days? (No if blank)   Patient at risk for readmission?: No    Discharge Plan  Screen findings are: Discharge planning needs identified or anticipated (Comment). (DMEs- oxygen and suction.)    Expected Discharge Date: TBD    Expected Transfer from Critical Care:  TBD    Quality data for continuing care services shared with patient and/or representative?: Yes  Patient and/or family were provided with choice of facilities / services that are available and appropriate to meet post hospital care needs?: Yes     Initial Assessment complete?: Yes  Malachi Bonds  March 29, 2023 2:39 PM

## 2023-03-29 NOTE — Unmapped (Signed)
New Surgery Consult Note      Requesting Attending Physician:  Dolores Patty, MD  Service Requesting Consult:  Med Undesignated (MDX)  Service Providing Consult: General Surgery, [facility]  Consulting Attending: Dr. Ruben Im     Assessment:  57 y/o M with T2DM (on insulin), primary CNS lymphoma, COPD with worsening functional decline over the last several months (on home hospice) who was BIBEMS to the ED on 6/22 for AMS and respiratory distress. Work-up in the ED notable for tachycardia to 150s (since downtrended to 100s after 2L of fluid and initiation of BiPAP), fever (39), and MAPs in the 100s. Laboratory work-up notable for leukopenia (1.4), thrombocytopenia (26), hyponatremia (130), elevated venous lactate (6.4) with VBG 7.36/32/54/19, elevated CRP 419, and unremarkable troponin, BNP, LFTs, INR, and UA. EKG with sinus tachycardia. CXR with LLL cavitary consolidation. CTA Chest with acute nonocclusive lobar PE in the left pulmonary artery (no heart strain) and c/f both aspiration pneumonia and fungal infection (lingula). CT Head negative. General Surgery c/s 6/23 for CT AP with c/f partial LBO of descending distal colon 2/2 LIH. However, on eval this am, Andre hernia is easily reduced including by him and he has no pain with it.  He describes Andre abdominal distension as less.     Recommendations:   -No evidence of LBO from easily reducible hernia  - I talked about hernia belt with him and Andre Duncan and Dr. Ashley Jacobs.  We'll try to get him one here, but she will order from [facility] if we don't have them.    We'll sign off.  Thank you for this consult.    I spent 30 min in chart review, review of labs and x-rays, discussion with pt, Duncan and team, examination.  Christia Reading    History of Present Illness:   Andre Duncan is seen in consultation for partial large bowel obstruction at the request of Dolores Patty, MD on the Med Undesignated (MDX) service.     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 Work-up in the ED notable for tachycardia to 150s (since downtrended to 100s after 2L of fluid and initiation of BiPAP), fever (39), and MAPs in the 100s. Laboratory work-up notable for leukopenia (1.4), thrombocytopenia (26), hyponatremia (130), elevated venous lactate (6.4) with VBG 7.36/32/54/19, elevated CRP 419, and unremarkable troponin, BNP, LFTs, INR, and UA. EKG with sinus tachycardia. CXR with LLL cavitary consolidation. CTA Chest with acute nonocclusive lobar PE in the left pulmonary artery (no heart strain) and c/f both aspiration pneumonia and fungal infection (lingula). CT Head negative. Surgery c/s for CT AP with c/f partial LBO of descending distal colon 2/2 LIH.     This AM pt has less distension, and says Andre hernia isn't bothering him at all.    Past Medical History:   Diagnosis Date    Anxiety     Cancer (CMS-HCC)     COPD (chronic obstructive pulmonary disease) (CMS-HCC)     Cytarabine poisoning     Depression     Diabetes mellitus (CMS-HCC)     Pneumonia, aspiration (CMS-HCC)     Prostate atrophy        Past Surgical History:   Procedure Laterality Date    CRANIOTOMY  05/06/2020  Medication:  Current Facility-Administered Medications   Medication Dose Route Frequency Provider Last Rate Last Admin    acetaminophen (OFIRMEV) 10 mg/mL injection 1,000 mg  1,000 mg Intravenous Q6H PRN Dolores Patty, MD        cefepime (MAXIPIME) 2 g in sodium chloride 0.9 % (NS) 100 mL IVPB-MBP  2 g Intravenous Q8H Dolores Patty, MD   Stopped at 03/28/23 2219    dexAMETHasone (DECADRON) 4 mg/mL injection 8 mg  8 mg Intravenous Q12H Dolores Patty, MD   8 mg at 03/28/23 2053    dextrose (D10W) 10% bolus 125 mL  12.5 g Intravenous Q10 Min PRN Dolores Patty, MD glucagon injection 1 mg  1 mg Intramuscular Once PRN Dolores Patty, MD        glucose chewable tablet 16 g  16 g Oral Q10 Min PRN Dolores Patty, MD        insulin glargine (LANTUS) injection 6 Units  6 Units Subcutaneous Nightly Dolores Patty, MD   6 Units at 03/28/23 2137    ipratropium-albuterol (DUO-NEB) 0.5-2.5 mg/3 mL nebulizer solution 3 mL  3 mL Nebulization Q6H PRN Dolores Patty, MD        levETIRAcetam (KEPPRA) injection 500 mg  500 mg Intravenous Q12H [facility] Dolores Patty, MD   500 mg at 03/28/23 2053    ondansetron (ZOFRAN-ODT) disintegrating tablet 4 mg  4 mg Oral Q8H PRN Dolores Patty, MD        Or    ondansetron [facility]) injection 4 mg  4 mg Intravenous Q8H PRN Dolores Patty, MD        pantoprazole (Protonix) injection 40 mg  40 mg Intravenous Daily Dolores Patty, MD   40 mg at 03/28/23 2000    [START ON 03/29/2023] vancomycin (VANCOCIN) 1500 mg in sodium chloride (NS) 0.9% 500 mL IVPB  1,500 mg Intravenous Q12H Dolores Patty, MD           Allergies   Allergen Reactions    Sulfa (Sulfonamide Antibiotics) Rash     Developed during hospitalization d/c     Tegaderm Adhesive-No Drug-Allergy Check Other (See Comments)       Social History:  Social History     Tobacco Use    Smoking status: Former     Current packs/day: 0.00     Average packs/day: 2.0 packs/day for 30.0 years (60.0 ttl pk-yrs)     Types: Cigarettes     Quit date: 09/2022     Years since quitting: 0.5    Smokeless tobacco: Never    Tobacco comments:     Quit 09-24-2022; previously 2ppd   Vaping Use    Vaping status: Never Used   Substance Use Topics    Alcohol use: Not Currently     Comment: rarely    Drug use: Never       Family History   Problem Relation Age of Onset    Cancer Mother     Diabetes Mother     Cancer Father        Review of Systems  10 systems were reviewed and are negative except as noted specifically in the HPI.    Objective:   BP 119/90  - Pulse 108  - Temp 37 ??C (98.6 ??F) (Axillary)  - Resp 29  - SpO2 96%     Intake/Output last 3 shifts:  I/O last 3 completed shifts:  In: 1760 [IV Piggyback:1760]  Out:  250 [Urine:250]    Physical Exam:  Vitals:    03/28/23 2119   BP:    Pulse: 108   Resp: 29   Temp:    SpO2: 96%      General: chronically ill-appearing, no acute distress   Neuro: alert and oriented  Head: normocephalic, atraumatic  Neck: Supple without masses  Cardiac: sinus tachycardia   Lungs: non labored breathing but o hig flow  Abdomen: soft, easily reducible L inguinal hernia whihc is non tender without any skin change over it.    Most Recent Labs:  Lab Results   Component Value Date    WBC 1.4 (L) 03/28/2023    HGB 11.7 (L) 03/28/2023    HCT 33.7 (L) 03/28/2023    PLT 31 (L) 03/28/2023       Lab Results   Component Value Date    NA 130 (L) 03/28/2023    NA 131 (L) 03/28/2023    K 5.1 (H) 03/28/2023    K 5.1 (H) 03/28/2023    CL 98 03/28/2023    CO2 17.2 (L) 03/28/2023    BUN 35 (H) 03/28/2023    CREATININE 0.75 03/28/2023    CALCIUM 9.1 03/28/2023    MG 1.8 03/28/2023    PHOS 4.3 01/19/2023       IMAGING:  CTA Chest W Contrast    Result Date: 03/28/2023  EXAM: CTA CHEST W CONTRAST ACCESSION: [id number] UN CLINICAL INDICATION: r/o PE TECHNIQUE: Contiguous axial images were reconstructed through the chest following a single breath hold helical acquisition during the administration of intravenous contrast material. Images were reformatted in the coronal and sagittal planes. MIP slabs were also constructed. COMPARISON: Chest CT 01/21/2023. Contemporaneous CT abdomen pelvis. FINDINGS: PULMONARY ARTERIES: Nonocclusive lobar pulmonary embolus within the left pulmonary artery extending into the segmental branches of the left upper and left lower lobes. Main pulmonary artery is normal in size. HEART AND VASCULATURE: Cardiac chambers are normal in size. No pericardial effusion. Aorta is normal in caliber. LUNGS, AIRWAYS, AND PLEURA: New irregular thick walled cavitary lesion within the lingula measuring 2.4 cm (series 6 image 98) and 3.9 cm thick walled cavitary lesion also within the left upper lobe (series 6 image 80). New diffuse bronchial wall thickening and diffuse bronchocentric groundglass opacities with interlobular septal thickening. Bibasilar atelectasis. Debris within the trachea. Mild centrilobular emphysema. No pleural effusion or pneumothorax. MEDIASTINUM AND LYMPH NODES: No enlarged intrathoracic, axillary, or supraclavicular lymph nodes. Patulous fluid-filled esophagus. Increased enhancement of the esophageal wall. CHEST WALL AND BONES: Multilevel degenerative changes of the spine. UPPER ABDOMEN: Please refer to concurrent CT abdomen/pelvis for findings below the diaphragm.     Acute nonocclusive lobar pulmonary embolus in the left pulmonary artery extending to the segmental branches of the left upper lobe and left lower lobes. No right heart strain. New diffuse bronchial wall thickening, diffuse bronchocentric groundglass opacities, debris within the trachea, and wall enhancing fluid-filled esophagus suggestive of aspiration pneumonia. Two new separate cavitary lesions within the lingula with irregular thick walls with differential diagnosis considerations include fungal infection, if the patient is immunocompromised, and septic pulmonary emboli  although these are less favored. ++++++++++++++++++++ The findings of this study were discussed via telephone with DR. ANDREW P MORGAN by Dr. Veverly Fells on 03/28/2023 6:09 PM. -----------------------------------------------    ECG 12 Lead    Result Date: 03/28/2023  SINUS TACHYCARDIA OTHERWISE NORMAL ECG WHEN COMPARED WITH ECG OF 19-Jan-2023 09:18, VENT. RATE HAS INCREASED BY  55 BPM NONSPECIFIC T WAVE  ABNORMALITY NOW EVIDENT IN INFERIOR LEADS T WAVE AMPLITUDE HAS INCREASED IN ANTERIOR LEADS Confirmed by Eldred Manges [phone]) on 03/28/2023 8:20:22 PM    CT Abdomen Pelvis W Contrast    Result Date: 03/28/2023  EXAM: CT ABDOMEN PELVIS W CONTRAST ACCESSION: [id number] UN CLINICAL INDICATION: 57 years old with abdominal pain  COMPARISON: CT abdomen pelvis 11/04/2022 TECHNIQUE: A helical CT scan of the abdomen and pelvis was obtained following IV contrast from the lung bases through the pubic symphysis. Images were reconstructed in the axial plane. Coronal and sagittal reformatted images were also provided for further evaluation. FINDINGS: LOWER CHEST: Please see dedicated CT chest for characterization of findings above the diaphragm. LIVER: Normal liver contour. Diffuse hepatic steatosis. No focal liver lesions. BILIARY: The gallbladder is normal in appearance. No biliary ductal dilatation.  SPLEEN: Normal in size and contour. PANCREAS: Normal pancreatic contour.  No focal lesions.  No ductal dilation. ADRENAL GLANDS: Normal appearance of the adrenal glands. KIDNEYS/URETERS: Symmetric renal enhancement.  No hydronephrosis. Subcentimeter hypoattenuating lesion of the anterior left upper pole kidney, too small to characterize. BLADDER: Decompressed with Foley catheter. Circumferential bladder wall thickening, likely accentuated due to underdistention. REPRODUCTIVE ORGANS: Unremarkable. GI TRACT: Proximal loops of small bowel are decompressed. Distal loops of small bowel are distended up to 3.7 cm with dependently layering fluid. The colon is markedly distended with gas, with herniation of the distal descending colon into a left inguinal hernia. There is a suspected transition point as the large bowel exits the hernia (5:41), although there is a moderate amount of gas seen within more distal loops of bowel and within the rectum. The appendix is not clearly identified but there are no secondary signs of appendicitis. PERITONEUM, RETROPERITONEUM AND MESENTERY: No free air.  No ascites.  No fluid collection. LYMPH NODES: No adenopathy. VESSELS: Hepatic and portal veins are patent.  Normal caliber aorta. Atherosclerotic calcifications of the abdominal aorta and its branch vessels. Filling defect in the right common femoral vein extending proximally to the external iliac vein. The infrahepatic IVC is slitlike. BONES and SOFT TISSUES: Multilevel degenerative changes of the spine. Degenerative changes to the bilateral sacroiliac joints. Left inguinal hernia containing loop of colon, as above.     - Partial/early large bowel obstruction secondary to herniation of the distal descending colon into a left inguinal hernia. - Filling defect in the right lower extremity extending proximally to the external iliac vein, which likely represents DVT although mixing artifact cannot be completely excluded. Recommend further follow-up with right lower extremity venous duplex exam. - Additional chronic/incidental findings as described in the body of the report. - nonspecific diffuse bladder wall thickening. Recommend correlation with urinalysis. ++++++++++++++++++++ The findings of large bowel obstruction were discussed via secure epic chat (with confirmed read receipt) with DR. ANDREW P MORGAN by Dr. Konrad Dolores, MD on 03/28/2023 6:05 PM. ----------------------------------------------- ==================== MODIFIED REPORT: (03/28/2023 6:30 PM) This report has been modified from its preliminary version; you may check the prior versions of radiology report, results history link for prior report versions (if they were previously visible in Epic). -----------------------------------------------    CT Head Wo Contrast    Result Date: 03/28/2023  EXAM: Computed tomography, head or brain without contrast material. ACCESSION: [id number] UN CLINICAL INDICATION: 57 years old Male with somnolence, known intracranial cancer  COMPARISON: Brain MRI 03/18/2023 TECHNIQUE: Axial CT images of the head  from skull base to vertex without contrast. FINDINGS: Redemonstrated hypodensities, corresponding to areas of signal abnormality on the recent brain MRI. The  known enhancing lesions around the right lateral ventricle are not well appreciated given lack of contrast and difference in imaging technique. Right parietal bone burr hole. There is no midline shift. There is no evidence of acute infarct. No acute intracranial hemorrhage. Global cerebral atrophy with ex vacuo dilatation of the CSF containing spaces. Multiple scattered and confluent hypodensities in the periventricular and deep white matter, nonspecific but commonly seen with small vessel ischemic changes. No fractures are evident. Small bilateral mastoid effusions and partial opacification of the maxillary sinuses, otherwise the sinuses are pneumatized.     No acute intracranial abnormality.     XR Chest Portable    Result Date: 03/28/2023  EXAM: XR CHEST PORTABLE ACCESSION: [id number] UN CLINICAL INDICATION: Fever. TECHNIQUE: Single View AP Chest Radiograph. COMPARISON: May 3. FINDINGS: New consolidation left lower lobe with possible cavitation consistent with pneumonia or aspiration. The right lung is clear. No pleural fluid or pneumothorax.     Consolidation left lower lobe with possible cavitation as discussed.

## 2023-03-29 NOTE — Unmapped (Signed)
Patient was taken off BIPAP and placed on HFNC today morning. Placed back to BIPAP in the afternoon per family request. Patient is  tachypneic

## 2023-03-29 NOTE — Unmapped (Addendum)
[facility]   History and Physical       Assessment and Plan     Andre Duncan is a 57 y.o. man with T2DM on insulin, BPH with lower urinary tract symptoms, COPD, primary CNS lymphoma (DLBCL type) who is admitted to [facility] ICU for septic shock and acute hypoxemic respiratory failure, complicated by acute lobar pulmonary embolus and partial large bowel obstruction.    # Goals of care  He was quite ill and had just transitioned to hospice before onset of sepsis, and given these new developments, his prognosis is poor. See my separate advanced care planning note for details.  -- DNAR / DNI confirmed  -- discuss transition to comfort measures if not improving    # Sepsis with acute hypoxic respiratory failure and septic shock (CMS-HCC)   Illness that poses a threat to life or bodily function without appropriate treatment. Clinically in shock when I met him in the ED -- tachycardic, cool extremities, elevated lactate -- though appears to be improving with fluid resuscitation and respiratory support. Vital sign derangements, fever consistent with sepsis, likely from pulmonary source based on history suggestive of aspiration and new LLL consolidation on CXR and CT. Lower suspicion for cardiogenic or obstructive shock given available data. Hemodynamically stable without vasopressors for now  -- admit to ICU status  -- follow blood, urine cultures  -- sputum culture if able to produce  -- cefepime, vancomycin (6/22 - )  -- add atypical +/- fungal cover if decompensating (immune-compromised)  -- VBG, lactate q4-6 until clearly stabilizing  -- strict intake/output  -- additional 1L LR now to reach ~30 mL/kg    # Acute hypoxemic respiratory failure  # Acute left lobar pulmonary embolus  Suspect this is due to pneumonia, sepsis, aspiration (see above) and exacerbated by new PE. No evidence of RV strain by CT or by bedside ultrasound. Poor candidate for anticoagulation given thrombocytopenia and CNS malignancy  -- BiPAP, titrate to goal SpO2 88-92% (COPD) and relief of dyspnea  -- echocardiogram in AM  -- withhold anticoagulation for now    # Persistent positive COVID  Has tested positive since 09/2022, treated with multiple rounds of antivirals (last 01/2023) and on prolonged course of dexamethasone for other indication. Do not think this is primary cause of his current presentation     # New cavitary lung lesions  CTA chest discussed personally with radiology. He has new cavitary lesions compared to prior imaging in 01/2023, of indeterminate import, but he is at risk for unusual organisms given cytopenias, recent B-cell depleting therapy and less recent CAR-T.  -- follow final interpretation of CT  -- consider ID consult, depending on clinical trajectory    # Acute toxic-metabolic encephalopathy  Multifactorial, likely due to sepsis, shock, discomfort superimposed on cognitive decline due to CNS malignancy. Also consider prolonged postictal state from unwitnessed seizure, given elevated lactate and mild hyperkalemia on presentation. CT head without acute bleed, acute infarct, new ventricular dilatation, mass effect or new parenchymal lesions, within limits of this modality.   -- treat underlying issues as above  -- depending on trajectory and goals of care, consider EEG; this would likely require transfer to main campus  -- routine delirium precautions: frequent reorientation, maintain sleep/wake cycle, avoid overnight interruptions, minimize lines/tubes, treat constipation and urinary retention    # Partial large bowel obstruction  Markedly distended abdomen. CT abdomen/pelvis personally discussed with radiology -- colon is dilated, there appears to be transition point associated with  a left inguinal hernia, but no ancillary findings to suggest active bowel ischemia. There is gas in the rectum distally. Does not seem to be in pain and has not vomited since arrival. He is a poor surgical candidate. Discussed with Dr Ruben Im; if surgery were warranted and within his goals of care, would need transfer to main campus  -- surgery team to see in AM  -- NPO  -- NG tube if he begins vomiting  -- serial abdominal exams  -- follow lactate, as above    # Primary CNS lymphoma (CMS-HCC)  # History of chimeric antigen receptor T-cell therapy  Detailed history available in recent oncology notes (last 03/20/2023), but in brief, was diagnosed in 2021, treated with chemotherapy and whole-brain radiation, unfortunately progressed through several lines of therapy (including CAR-T in 09/2022). Most recently treated with rituximab (last 02/27/2023), temozolomide and high-dose dexamethasone. He has partial response by MRI brain, but cognitive and functional status has remained poor. Now on hospice.  -- continue dexamethasone 8 mg q12 but switch to IV  -- continue levetiracetam for seizure ppx but switch to IV  -- prophylaxis: HOLD valacyclovir for now; last pentamidine was 02/27/2023    # Pancytopenia (CMS-HCC)  Leukopenia, thrombocytopenia have been present within last month and likely due to underlying disease and multiple therapies received for same. Probably worsened by sepsis  -- CBC/diff daily    # COPD (chronic obstructive pulmonary disease) (CMS-HCC)  Blood gases, exam not consistent with airway obstruction as cause of respiratory failure. Not wheezing.  -- albuterol / ipratropium nebs prn    # Anxiety and depression  -- HOLD SSRI    # BPH (benign prostatic hyperplasia)  -- HOLD finasteride  -- keep Foley while critically ill    # Diabetes mellitus (CMS-HCC) with hyperglycemia  -- HOLD home oral and non-formulary injectable medications  -- insulin glargine 6 units nightly (from 17 units)  -- sliding-scale insulin for goal glucose < 180  -- POC glucose q6 while NPO      Daily checklist:  VTE prophylaxis: high risk, PADUA score >= 4; HOLD pharmacologic ppx due to bleeding risk  Diet: No diet orders on file  Code status: DNR and DNI  HCDM: HCDM (patient stated preference): [name] Spouse - [phone]    Disposition: ICU status. Poor prognosis.      Significant Comorbid Conditions:  -Medication-induced coagulopathy with bleeding POA requiring further treatment, investigation or monitoring  -Thrombocytopenia POA requiring further investigation or monitor  -Anemia POA requiring further investigation or monitoring  -Age related debility POA requiring additional resources: DME, PT, or OT  -Acidosis POA requiring further investigation, treatment, or monitoring  -Dehydration POA requiring further investigation, treatment, or monitoring  -Hyperkalemia POA requiring further investigation, treatment, or monitoring  -Hyponatremia POA requiring further investigation, treatment, or monitoring    Issues Impacting Complexity of Management:  -Intensive monitoring of drug toxicity from Vancomycin with scheduled BMP and/or Vancomycin levels and Cefepime with scheduled BMP  -The patient is at high risk from Hospital immobility in an elderly patient given baseline poor functional status with a high risk of causing delirium and further decline in function  -Need for the following intensive monitoring parameter(s) due to high risk of clinical decline: continuous oxygen monitoring, telemetry, and frequent monitoring of urine output to assess for the efficacy of diuretic regimen  -Need for intensive oxygen therapy of BiPAP, which places the patient at high risk for oxygen toxicity    Medical Decision Making:   --  Reviewed records from the following unique sources: [facility] oncology notes x 2, [facility] discharge summaries x 3.   -- Assessment required an independent historian, patient's wife [name], due to altered mental status  -- Independently interpreted labs, studies, imaging as summarized in this note.   -- Discussed the patient's management and/or test interpretation with ED Provider as summarized in this note  -- Additional labs, studies ordered as summarized in this note    I personally spent greater than 105 minutes face-to-face and non-face-to-face in the care of this patient, which includes all pre, intra, and post visit time on the date of service.  All documented time was specific to the E/M visit and does not include any procedures that may have been performed.    HPI      Andre Duncan is a 57 y.o. man with T2DM, BPH with lower urinary tract symptoms, COPD, primary CNS lymphoma (DLBCL type) currently off treatment aside from steroids, and significant functional decline over last several months who is brought in by EMS for respiratory distress     All history obtained from his wife and he is too somnolent to answer questions. She reports that he has become progressively weaker over the last 1-2 months and is now bedbound. He cannot get up without assistance, and if he falls, needs help from neighbors or EMS to get up. He spends much of the day confused. Has not wanted to eat for at least a week, although has been drinking fluids. Over last few days she noticed his abdomen becoming more distended and his urine has been dark. Today he was vomiting dark gritty material. She later noticed he looked dusky and used home pulse ox to measure peripheral saturation in the 70%s so called EMS.    Per report from ED, EMS personnel found him obtunded, tachycardic to 150s and in frank respiratory distress. Applied nasal cannula, nonrebreather and finally CPAP to get saturations to mid-80%s.     In the ED he was tachycardic (HR 150s, improving to 110s), febrile (Tmax 39C), not hypotensive (130s/100s), tachypneic (RR 30s-40s) with peripheral saturations in mid-90%s on BiPAP 22/12 FiO2 60%. Labs notable for total WBC 1.4 (84% PMNs), Hb 11.7, platelets 31 (from 50s-60s in last few months), Na 130; 131, K 5.1; 5.1, bicarb 17.2, sCr 0.75, glucose 363, AST 69, ALT 95, Tbili 0.7, albumin 2.3, INR 1.42, BNP 75, troponin 26. VBG 7.36 / 32 (stable), lactate 6.4 > 2.8. UA 8 WBCs, 5 RBCs, +hyaline casts. CXR with left lower lobe consolidation, new from prior imaging. Peripheral IV and IO access obtained, Foley placed, blood and urine cultures obtained. Crystalloid fluids (1L total), cefepime, vancomycin given.    CT head, CTA chest, CT abd/pelvis ordered by me.     Social history  Lives with his wife in [place]. Enrolled on home hospice services about a week ago although wife does most of his hour-to-hour care. 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      Med Rec Confidence     I was NOT able to review the Medication List with the patient or a representative. Further medication reconciliation is needed.    Physical Exam   Temp:  [37.4 ??C (99.3 ??F)-39 ??C (102.2 ??F)] 37.4 ??C (99.3 ??F)  Heart Rate:  [117-153] 117  SpO2 Pulse:  [109-154] 117  Resp:  [25-45] 33  BP: (124-144)/(97-127) 126/100  FiO2 (%):  [60 %-100 %] 60 %  SpO2:  [84 %-98 %] 96 %  There is no height or weight on file to calculate BMI.    GEN: chronically and acutely ill appearing man lying on ED stretcher  HEENT: PERRL, sclerae anicteric, oral mucosa tacky  CV: tachycardic (HR 110s), regular rhythm, normal S1/S2, JVP at clavicle  PULM: tachypneic (RR 30s) and labored on BiPAP but pulling >8 mL/kg tidal volume, right lung fields clear, left lung diminished at base  ABD: distended and firm, does not wince with palpation, quiet bowel sounds  GU: Foley in place draining clear yellow urine. Large left inguinal hernia containing gas-filled bowel, easily reducible but then pops out again  EXTR: legs cool below knee but 1+ pedal pulses  MSK: marked loss of bulk in all large muscle groups  SKIN: pale, numerous ecchymoses (most notably on lower legs), innumerable scattered perifollicular petechiae  NEURO: somnolent, rouses to loud voice or sternal rub, can give his name but not say much else; when wife arrives, able to lift his head and look in her direction    POCUS:  heart: hyperdynamic LV, no septal flattening, no significant pericardial effusion, unable to visualize IVC due to overlying bowel gas  lungs: dense consolidation left lower chest wall, normal lung sliding right chest wall    ------------  Dolores Patty, MD PhD  [facility]

## 2023-03-29 NOTE — Unmapped (Signed)
Problem: Gas Exchange Impaired  Goal: Optimal Gas Exchange  Outcome: Ongoing - Unchanged   Received and remains on documented bipap settings, pt became more alert during the shift, small nose bleed that resolved, no complications or distress noted this shift.

## 2023-03-29 NOTE — Unmapped (Signed)
Hospitalist Daily Progress Note    LOS: 1 day(s) under inpatient status.    Summary:  57 y.o. man with primary CNS lymphoma admitted for septic shock with acute hypoxemic respiratory failure, acute lobar PE and early/partial large bowel obstruction.       Interval events:  Admitted to ICU yesterday. Was on BiPAP off and on with improvement in mental status.    Subjective:  I confirmed the history as documented in the admission H&P.   I clarified the patient and family's goals of care (see ACP note).   He is short of breath but more alert.   He really wants to be able to eat and, especially, to drink.     Objective:  Weight:  Last 5 Recorded Weights    03/29/23 0215   Weight: 63.5 kg (140 lb)       Vital signs in last 24 hours:  Temp:  [36.6 ??C (97.9 ??F)-39 ??C (102.2 ??F)] 36.6 ??C (97.9 ??F)  Heart Rate:  [106-153] 106  SpO2 Pulse:  [107-154] 107  Resp:  [23-45] 23  BP: (115-144)/(85-127) 120/90  MAP (mmHg):  [97-135] 99  FiO2 (%):  [60 %-100 %] 65 %  SpO2:  [84 %-98 %] 96 %    Intake/Output last 24 hours:    Intake/Output Summary (Last 24 hours) at 03/29/2023 0743  Last data filed at 03/29/2023 0600  Gross per 24 hour   Intake 1960 ml   Output 1945 ml   Net 15 ml       Physical Exam:  General: Awake, alert, oriented x2 [facility]). Acutely and chronically ill-appearing. Nods yes throughout most conversation.   HEENT: PERRL. HFNC in place.  Heart:  Tachycardic  rate and regular rhythm. Normal S1 S2.   Lungs: Tachypneic with increased WOB on HFNC. Coarse breath sounds diffusely on limited anterior exam.  Abdomen: Distended. Hypoactive BS. Soft, not apparently tender to palpation.   Extremities: warm with bounding pulses    I reviewed all new labs and tests available in the EMR since the last note and interpreted them personally.     Medications:  Scheduled Meds:   cefepime  2 g Intravenous Q8H    dexAMETHasone  8 mg Intravenous Q12H    insulin glargine  6 Units Subcutaneous Nightly    levETIRAcetam  500 mg Intravenous Q12H SCH    pantoprazole (Protonix) intravenous solution  40 mg Intravenous Daily    vancomycin  1,500 mg Intravenous Q12H     Continuous Infusions:  PRN Meds:.acetaminophen, dextrose in water, glucagon, glucose, ipratropium-albuterol, ondansetron **OR** ondansetron    Assessment/Plan:  Principal Problem:    Sepsis with acute hypoxic respiratory failure and septic shock (CMS-HCC)  Active Problems:    Primary CNS lymphoma (CMS-HCC)    COPD (chronic obstructive pulmonary disease) (CMS-HCC)    Anxiety and depression    BPH (benign prostatic hyperplasia)    Diabetes mellitus (CMS-HCC)    History of chimeric antigen receptor T-cell therapy    Pancytopenia (CMS-HCC)    Acute hypoxemic respiratory failure (CMS-HCC)    Acute pulmonary embolism (CMS-HCC)    Large bowel obstruction (CMS-HCC)  Resolved Problems:    * No resolved hospital problems. *         # Goals of care  The patient and family are primarily focused on quality of remaining life, however long or short that may be, rather than quantity of life / extending life. They hope that he might improve enough for him to  go home with hospice as had been planned but are aware that he may not improve enough, and are open to inpatient hospice or even transitioning to a fully comfort-focused approach here. For now they would like to try antibiotics with respiratory support for 24-48h and see how he is doing.    # Sepsis with acute hypoxic respiratory failure and septic shock   # Acute left lobar pulmonary embolus  # Multifocal pneumonia  # c/f Partial LBO  -- Admitted to ICU status  -- Follow blood, urine, sputum cultures  -- CONT IV cefepime, vancomycin (6/22 - ), ADD IV metronidazole (6/23- )  -- Monitor I/O, provide IVFs as needed  -- Respiratory support with BiPAP, titrate to goal SpO2 88-92% (COPD) and relief of dyspnea  -- Defer echocardiogram and anticoagulation given overall goals of care  -- General Surgery appreciated  -- Allow patient to po ad lib for comfort ; discussed risks of aspiration and worsening bowel function with family    # Acute toxic-metabolic encephalopathy  Multifactorial, likely due to sepsis, shock, discomfort superimposed on cognitive decline due to CNS malignancy.     # Primary CNS lymphoma   # History of chimeric antigen receptor T-cell therapy  Detailed history available in recent oncology notes (last 03/20/2023). Recent partial response to therapy on MRI brain, but cognitive and functional status has remained poor. Now on hospice.  -- continue dexamethasone 8 mg q12 ; continue taper as planned outpatient  -- continue levetiracetam for seizure ppx but switch to IV  -- prophylaxis: HOLD valacyclovir for now; last pentamidine was 02/27/2023    # Pancytopenia  Leukopenia, thrombocytopenia have been present within last month and likely due to underlying disease and multiple therapies received for same. Probably worsened by sepsis  -- Minimize phlebotomy given overall goals    # COPD (chronic obstructive pulmonary disease)   -- albuterol / ipratropium nebs prn    # Anxiety and depression  -- HOLD SSRI    # BPH (benign prostatic hyperplasia)  -- HOLD finasteride  -- keep Foley while critically ill    # Diabetes mellitus with hyperglycemia  -- HOLD home oral and non-formulary injectable medications  -- insulin glargine 6 units nightly (from 17 units)  -- sliding-scale insulin for goal glucose < 180  -- Anticipate stopping these interventions pending clinical trajectory    Nutrition: Nutrition Therapy Regular/House.  Allow patient to po ad lib for comfort ; discussed risks of aspiration and worsening bowel function with family.  Prophylaxis: DVT-   None for now  - - GI- home PPI  Disposition:  Continue ICU level of care . Further GOC discussions pending clinical trajectory. See ACP note.  .   Code Status: DNR and DNI confirmed on admission.  HCDM:   HCDM (patient stated preference): [name] - Spouse - [phone]    Sallyanne Havers, MD, MPA  Please page the [facility] pager at [phone] with questions.     I personally spent greater than 50 face-to-face with the patient and non face-to-face coordinating the patient's care, excluding procedures, on the day of the service. This included communication with admitting MD, communication with patient at bedside, communication with family, communication with RN, communication with ICU consultants, and communication with receiving MD regarding the above patient assessment, data review, plan of care, and disposition planning.    Critical Care Time greater than: 1 Hour

## 2023-03-29 NOTE — Unmapped (Signed)
Problem: Adult Inpatient Plan of Care  Goal: Plan of Care Review  Outcome: Not Progressing  Goal: Patient-Specific Goal (Individualized)  Outcome: Not Progressing  Goal: Absence of Hospital-Acquired Illness or Injury  Outcome: Not Progressing  Intervention: Prevent Skin Injury  Recent Flowsheet Documentation  Taken 03/29/2023 0000 by Unk Lightning, RN ADN  Positioning for Skin: Right  Taken 03/28/2023 2200 by Unk Lightning, RN ADN  Positioning for Skin: Left  Taken 03/28/2023 2000 by Unk Lightning, RN ADN  Positioning for Skin: Right  Device Skin Pressure Protection: absorbent pad utilized/changed  Skin Protection: adhesive use limited  Goal: Optimal Comfort and Wellbeing  Outcome: Not Progressing  Goal: Readiness for Transition of Care  Outcome: Not Progressing  Goal: Rounds/Family Conference  Outcome: Not Progressing

## 2023-03-29 NOTE — Unmapped (Addendum)
MICU History & Physical     Date of Service: 03/29/2023    Problem List:   Principal Problem:    Sepsis with acute hypoxic respiratory failure and septic shock (CMS-HCC)  Active Problems:    Primary CNS lymphoma (CMS-HCC)    COPD (chronic obstructive pulmonary disease) (CMS-HCC)    Anxiety and depression    BPH (benign prostatic hyperplasia)    Diabetes mellitus (CMS-HCC)    History of chimeric antigen receptor T-cell therapy    Pancytopenia (CMS-HCC)    Acute hypoxemic respiratory failure (CMS-HCC)    Acute pulmonary embolism (CMS-HCC)    Large bowel obstruction (CMS-HCC)  Resolved Problems:    * No resolved hospital problems. *      HPI: Andre Duncan is a 57 y.o. 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  He was subsequently admitted to the ICU.    24hr events: Admitted to the ICU overnight    Neurological     Delirium: Multifocal in cause, including known DLBCL, known cerebral edema, acute metabolic/toxic encephalopathy, and hypoxemic respiratory failure.  -Currently receiving dexamethasone 8 mg twice daily (IV).  Was previously receiving 6 mg in the morning and 4 mg in the evening.  Plan to taper to 4 mg twice daily tomorrow if he remained outpatient.    History of seizures: Continue Keppra 500 mg twice daily  Cognitive difficulties: Can restart memantine when able to tolerate oral meds  Mood disorder: Restart home Lexapro when able to tolerate oral meds    Analgesia: Pain adequately controlled  RASS at goal? N/A, not on sedation  [place] Agitation Assessment Scale (RASS) : 0 (03/29/2023  8:00 AM)       Pulmonary     COPD 2E with radiographic emphysema   Active tobacco use disorder  Multifocal pneumonia  New cavitary lung lesions  New nonocclusive left pulmonary artery embolism  Acute hypoxemic respiratory failure    CT angiography of his chest demonstrated acute nonocclusive lobar PE in the left pulmonary artery.  This also demonstrated diffuse bronchocentric groundglass opacities concerning for aspiration pneumonia.  He does have 2 new separate cavitary lesions within the lingula, additionally.  No documented history of tuberculosis.  Given rapid appearance of cavitary lesions, favor likely bacterial etiology of lesions rather than fungal or mycobacterial... Although he is at high risk of atypical infections given his immunosuppression  -For now, would continue holding anticoagulation in the setting of significant thrombocytopenia to 18 K.  -Please start DuoNebs, every 4 hours scheduled.  Please start arformoterol 15 mcg twice daily.  -Receiving equivalent of approximately ~100 mg of prednisone daily (8 mg dexamethasone IV twice daily).  Can hold off on inhaled budesonide for the moment  -Favor SpO2 goal 88 to 90%; as much use of BiPAP as he can tolerate, and high flow otherwise  -Antibiotics as noted below  -Please start nicotine patch, 21 mg      FiO2 (%):  [60 %-100 %] 65 %  O2 Device: Heated high flow nasal cannula  O2 Flow Rate (L/min):  [60 L/min] 60 L/min    Cardiovascular     Hyperlipidemia: Can hold Zetia for the moment    Renal     AKI with hyperkalemia, now  resolved: Presented with creatinine of 0.75, potassium of 5.1.  Baseline creatinine appears to be between 0.4 and 0.6.  This morning creatinine is 0.38.      Infectious Disease/Autoimmune     Multifocal pneumonia with cavitary lung lesions  Sepsis with febrile neutropenia    Given his aspiration events, and lung imaging, the most likely source of his sepsis is pneumonia.  He was initiated on vancomycin/cefepime.  His MRSA nares are negative.  Urinalysis with 8 WBC, no bacteria seen, no leukocyte esterase or nitrites.  Specific gravity hide 1.03.  UA can be falsely reassuring in the setting of neutropenia, but absence of nitrites and bacteria likely points away from infection.  Lactate is now cleared.  - Would give anaerobic coverage given cavitary lung lesions (add Flagyl to cefepime, or switch to Zosyn).  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  His radiographic imaging appears to be more consistent with multifocal, predominantly bibasilar bacterial/aspiration pneumonia rather than COVID-pneumonia.  I believe this is probably persistent viral shedding in the setting of significant immunosuppression  -Can consider COVID directed therapeutics if his clinical symptoms were to change, but would hold off on remdesivir/tocilizumab for now.    Prophylactic medications   -Can restart Diflucan and valacyclovir when able to tolerate oral meds      Cultures:  No results found for: BLOOD CULTURE, BLOOD CULTURE, ROUTINE, URINE CULTURE, COMPREHENSIVE, URINE CULTURE, COMPREHENSIVE, LOWER RESPIRATORY CULTURE  WBC (10*9/L)   Date Value   03/29/2023 1.1 (L)     WBC, UA (/HPF)   Date Value   03/28/2023 8 (H)              FEN/GI/Urinary     Developing small bowel obstruction: Luckily, he is passing gas.  Last bowel movement was on 03/26/2023.  Appreciate surgical evaluation.  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  GLIM criteria:   Patient is younger than 30 with BMI <20 -- Non-Severe Malnutrition  -I have screened this patient for malnutrition and they did meet criteria for malnutrition based on GLIM criteria.  -Nutrition consulted no  RD assessment:Not done yet.           Heme/Coag     CNS DLBCL  Pancytopenia    He had DLBCL diagnosed 05/2020.  He was given methotrexate, cytarabine, thiotepa and rituximab from 06/25/2020 to 08/25/2020 with improvement in his mass size.  He had relapse in 08/2021 with a seizure at that time.  He received 5 fractions of CyberKnife completed December 2022.  He was given ibrutinib from 09/24/2021 to 06/25/2022.  He had improvement in his brain imaging during this treatment, but had progressive disease noted 07/2022.  Was given cycles 3 with lenalidomide and obinutuzumab with disease progression subsequently.  He then was given CAR-T therapy as part of a phase 1 study for DLBCL after lymphocyte depletion.  This  was performed 09/26/2022.  He subsequently had progression of disease 01/2023 with significant edema in his brain, and was addition to hospice 03/19/23.  He last saw his cancer physician on 03/20/2023.  At that time, he was receiving high doses of dexamethasone.  -Recommend continuing dexamethasone 8 mg twice daily as you are doing, we will plan to taper in the next few days  -Restart temozolomide when able to tolerate oral meds    Endocrine     T2DM: Presented glucoses were quite high with mild anion gap noted.  Anion gap is now resolved and glucoses are improving, now down to 240 mg/dL this morning.  Last A1c 5.6.  -Continue insulin glargine 6 units nightly; low threshold to add on sliding scale insulin  -Hold home metformin       Integumentary   #  - WOCN consulted for high risk skin assessment Yes.  - WOCN recs >> pending   - cont pressure mitigating precautions per skin policy    Prophylaxis/LDA/Restraints/Consults   ICU Checklist completed: yes (see ICU rounding navigator in Epic)      Patient Lines/Drains/Airways Status       Active Active Lines, Drains, & Airways       Name Placement date Placement time Site Days    Urethral Catheter Temperature probe;Straight-tip 16 Fr. 03/28/23  1455  Temperature probe;Straight-tip  less than 1    Peripheral IV 03/28/23 Anterior;Distal;Left;Lower Saphenous 03/28/23  1416  Saphenous  1    Peripheral IV 03/28/23 Left Antecubital 03/28/23  1830  Antecubital  less than 1                  Patient Lines/Drains/Airways Status       Active Wounds       None                    Goals of Care     Code Status: DNR/DNI    Designated Healthcare Decision Maker:  Mr. Kaus current decisional capacity for healthcare decision-making is Full capacity. His designated healthcare decision maker(s) is/are   HCDM (patient stated preference): [name] - Spouse - [phone].      Subjective     HPI:    Regarding his malignancy history, he had DLBCL diagnosed 05/2020.  He was given methotrexate, cytarabine, thiotepa and rituximab from 06/25/2020 to 08/25/2020 with improvement in his mass size.  He had relapse in 08/2021 with a seizure at that time.  He received 5 fractions of CyberKnife completed December 2022.  He was given ibrutinib from 09/24/2021 to 06/25/2022.  He had improvement in his brain imaging during this treatment, but had progressive disease noted 07/2022.  Was given cycles 3 with lenalidomide and obinutuzumab with disease progression subsequently.  He then was given CAR-T therapy as part of a phase 1 study for DLBCL after lymphocyte depletion.  This was performed 09/26/2022.  He subsequently had progression of disease 01/2023 with significant edema in his brain, and was addition to hospice 03/19/23.  He last saw his cancer physician on 03/20/2023.  At that time, he was receiving high doses of dexamethasone.    He presented to the emergency department yesterday 03/28/23 due to shortness of breath.  He was brought in by EMS, who found him hypoxemic at home, and started CPAP.  He was diagnosed with multifocal pneumonia and admitted to the ICU on continuous BiPAP.  His initial lactate was 6.4.  CTA of his chest did demonstrate acute nonocclusive lobar pulmonary  emboli in the left pulmonary artery with segmental extension of the left upper lobe and left lower lobes.  He also had 2 separate cavitary lesions within the lingula with irregular thick walls.  CT abdomen/pelvis demonstrated a partial/early Large bowel obstruction with herniation of the distal descending colon into a left inguinal hernia.    His wife tells me that he has been feeling ill for roughly the last week.  He has been coughing up sputum, and then had a significant aspiration event yesterday morning.  She found a good amount of black/brown vomitus on his gown yesterday morning, and he seemed to worsen significantly.  He was febrile when he came to the emergency department, but she did not otherwise note fevers for him over the last week.  He has been feeling more rundown over the last week.  He has been smoking around 2 packs/day.  His last bowel movement was on Thursday, and his abdomen has been getting more distended.  He has been passing gas.  He currently uses DuoNebs, and Combivent at home.  He also uses Bevespi.  He does not use oxygen at home.    She tells me that his dexamethasone is being weaned.  He was most recently on 6 mg in the morning and 4 mg in the evening.  Plan was to drop to 4 mg in the morning and 4 mg in the evening tomorrow, and then continue cutting by 2 mg daily until down to 2 mg twice daily.  He has had some falls recently, last around 3 weeks ago.  He has significant bruising given his falls and thrombocytopenia.    Regarding his COPD, he does follow with [facility] pulmonary clinic.  It appears that he was diagnosed in 2013 2014.  Denies history of asthma.  He has abnormal PFTs, DLCO 45%.  Of note, he has tested positive for COVID on [place] 09/24/2022, 10/04/2022, 10/17/2022, 10/31/2022, 01/18/2023, 01/19/2023, and again 03/28/2023.    ROS: Review of systems was obtained from his wife as the patient is altered    Allergies  Allergies   Allergen Reactions    Sulfa (Sulfonamide Antibiotics) Rash     Developed during hospitalization d/c     Tegaderm Adhesive-No Drug-Allergy Check Other (See Comments)       Meds  No current facility-administered medications on file prior to encounter.     Current Outpatient Medications on File Prior to Encounter   Medication Sig    acetaminophen (TYLENOL EXTRA STRENGTH) 500 MG tablet Take 1 tablet (500 mg total) by mouth every six (6) hours as needed for pain. (Patient not taking: Reported on 02/13/2023)    albuterol HFA 90 mcg/actuation inhaler Inhale 2 puffs every six (6) hours as needed for wheezing or shortness of breath. With Spacer    aluminum-magnesium hydroxide (MAALOX) 200-200 mg/5 mL suspension Take by mouth every six (6) hours as needed for indigestion. (Patient not taking: Reported on 02/13/2023)    blood sugar diagnostic (ACCU-CHEK GUIDE TEST STRIPS) Strp Use as instructed. Check blood sugar four times a day (before meals and at bedtime).    blood-glucose meter kit Use as instructed. Check blood sugar four times a day (before meals and at bedtime).    cetirizine (ZYRTEC) 10 MG tablet Take 1 tablet (10 mg total) by mouth nightly.    cholecalciferol, vitamin D3-50 mcg, 2,000 unit,, 50 mcg (2,000 unit) tablet Take 1 tablet (50 mcg total) by mouth daily.    dexAMETHasone (DECADRON) 4 MG tablet Take  2 tablets (8 mg total) by mouth every twelve (12) hours.    docusate sodium (COLACE) 100 MG capsule Take 1 capsule (100 mg total) by mouth two (2) times a day.    dronabinol (MARINOL) 5 MG capsule Take 1 capsule (5 mg total) by mouth Two (2) times a day (30 minutes before a meal).    escitalopram oxalate (LEXAPRO) 20 MG tablet Take 1 tablet (20 mg total) by mouth daily.    ezetimibe (ZETIA) 10 mg tablet Take 10 mg by mouth daily. Not taking (Patient not taking: Reported on 02/13/2023)    finasteride (PROSCAR) 5 mg tablet Take 1 tablet (5 mg total) by mouth daily.    fluconazole (DIFLUCAN) 200 MG tablet Take 2 tablets daily while on steroids    fluconazole (DIFLUCAN) 200 MG tablet Take 1 tablet (200 mg total) by mouth daily.    fluoride, sodium, 1.1 % Gel Brush teeth with a pea-sized amount of the paste for 2 min and spit.  No rinsing. Do not eat or drink for 30 minutes after use. Use twice daily    insulin glargine (BASAGLAR, LANTUS) 100 unit/mL (3 mL) injection pen Inject 0.17 mL (17 Units total) under the skin nightly.    insulin lispro (HUMALOG KWIKPEN INSULIN) 100 unit/mL injection pen     ipratropium-albuterol (DUO-NEB) 0.5-2.5 mg/3 mL nebulizer Inhale 3 mL by nebulization every six (6) hours as needed (wheezing, shortness of breath).    lancets (ACCU-CHEK SOFTCLIX LANCETS) Misc Use as instructed. Check blood sugar four times a day (before meals and at bedtime).    levETIRAcetam (KEPPRA) 500 MG tablet Take 1 tablet (500 mg total) by mouth two (2) times a day.    memantine (NAMENDA) 10 MG tablet Take 1 tablet (10 mg total) by mouth two (2) times a day.    metFORMIN (GLUCOPHAGE) 500 MG tablet Take 2 tablets (1,000 mg total) by mouth in the morning and 2 tablets (1,000 mg total) in the evening. Take with meals. (Patient taking differently: Take 1 tablet (500 mg total) by mouth in the morning and 1 tablet (500 mg total) in the evening. Take with meals.)    mirtazapine (REMERON) 30 MG tablet Take 1 tablet (30 mg total) by mouth nightly.    multivitamin (TAB-A-VITE/THERAGRAN) per tablet Take 1 tablet by mouth daily.    nicotine (NICODERM CQ) 21 mg/24 hr patch Place 1 patch on the skin and change daily.    ondansetron (ZOFRAN) 8 MG tablet Take 1 tablet (8 mg total) by mouth every eight (8) hours as needed for nausea. (Patient not taking: Reported on 02/13/2023)    pantoprazole (PROTONIX) 40 MG tablet Take 1 tablet (40 mg total) by mouth two (2) times a day.    pen needle, diabetic (UNIFINE PENTIPS) 32 gauge x 1/4 (6 mm) Ndle Use as instructed. Check blood sugar four times a day (before meals and at bedtime).    senna-docusate (PERICOLACE) 8.6-50 mg Take 1 tablet by mouth nightly.    temozolomide (TEMODAR) 20 mg capsule Take 2 capsules (40 mg total) by mouth daily  with 1 other temozolomide prescription for 50 mg total.    temozolomide (TEMODAR) 5 mg capsule Take 2 capsules (10 mg total) by mouth daily  with 1 other temozolomide prescription for 50 mg total.    thiamine (B-1) 100 MG tablet Take 1 tablet (100 mg total) by mouth daily.    valACYclovir (VALTREX) 500 MG tablet Take 1 tablet (500 mg total) by mouth two (2)  times a day.       Past Medical History  Past Medical History:   Diagnosis Date    Anxiety     Cancer (CMS-HCC)     COPD (chronic obstructive pulmonary disease) (CMS-HCC)     Cytarabine poisoning     Depression     Diabetes mellitus (CMS-HCC)     Pneumonia, aspiration (CMS-HCC)     Prostate atrophy        Past Surgical History  Past Surgical History:   Procedure Laterality Date    CRANIOTOMY  05/06/2020       Family History  Family History   Problem Relation Age of Onset    Cancer Mother     Diabetes Mother     Cancer Father        Social History  Social History     Socioeconomic History    Marital status: Married     Spouse name: [name]   Tobacco Use    Smoking status: Former     Current packs/day: 0.00     Average packs/day: 2.0 packs/day for 30.0 years (60.0 ttl pk-yrs)     Types: Cigarettes     Quit date: 09/2022     Years since quitting: 0.5    Smokeless tobacco: Never    Tobacco comments:     Quit 09-24-2022; previously 2ppd   Vaping Use    Vaping status: Never Used   Substance and Sexual Activity    Alcohol use: Not Currently     Comment: rarely    Drug use: Never    Sexual activity: Yes     Partners: Female   Social History Narrative    Lives with spouse     Social Determinants of Health     Financial Resource Strain: Low Risk (10/22/2022)    Overall Financial Resource Strain (CARDIA)     Difficulty of Paying Living Expenses: Not very hard   Food Insecurity: No Food Insecurity (10/22/2022)    Hunger Vital Sign     Worried About Running Out of Food in the Last Year: Never true     Ran Out of Food in the Last Year: Never true   Transportation Needs: No Transportation Needs (10/22/2022)    PRAPARE - Transportation     Lack of Transportation (Medical): No     Lack of Transportation (Non-Medical): No   Stress: Stress Concern Present (11/05/2020)    Received from [facility] - Occupational Stress Questionnaire     Feeling of Stress : Rather much    Received from [employer]    Social Network           Objective     Vitals - past 24 hours  Temp:  [36.6 ??C (97.9 ??F)-39 ??C (102.2 ??F)] 37.2 ??C (98.9 ??F)  Heart Rate:  [104-151] 108  SpO2 Pulse:  [103-148] 109  Resp:  [23-55] 37  BP: (103-144)/(66-127) 109/67  FiO2 (%):  [60 %-100 %] 65 %  SpO2:  [87 %-98 %] 87 % Intake/Output  I/O last 3 completed shifts:  In: 1960 [IV Piggyback:1960]  Out: 1945 [Urine:1945]     Physical Exam:    General: Appears ill, appears in minor distress  HEENT: High flow nasal cannula present, poor dentition  CV: Tachycardia, normal S1/S2  Pulm: Significantly coarse breath sounds bilaterally  GI: Distended abdomen, mildly tender diffusely, no point tenderness, easily reducible hernia  MSK: Cachectic with low muscle mass  Skin: No peripheral edema noted, multiple sites of bruising  Neuro: Confused, conversant, tired.  Moves all extremities      Body mass index is 18.47 kg/m??.            Wt Readings from Last 12 Encounters:   03/29/23 63.5 kg (140 lb)   02/27/23 74.5 kg (164 lb 3.9 oz)   02/13/23 75.5 kg (166 lb 7.2 oz)   01/30/23 78.5 kg (173 lb 1 oz)   01/21/23 78.9 kg (174 lb)   01/16/23 78.9 kg (174 lb)   12/26/22 78 kg (171 lb 15.3 oz)   12/24/22 76.6 kg (168 lb 12.8 oz)   12/17/22 75.5 kg (166 lb 6.4 oz)   12/05/22 73.5 kg (162 lb 0.6 oz)   12/03/22 71.6 kg (157 lb 13.6 oz)   11/21/22 70.7 kg (155 lb 13.8 oz)       Continuous Infusions:       Scheduled Medications:    cefepime  2 g Intravenous Q8H    dexAMETHasone  8 mg Intravenous Q12H    insulin glargine  6 Units Subcutaneous Nightly    levETIRAcetam  500 mg Intravenous Q12H SCH    metroNIDAZOLE  500 mg Intravenous Q8H    pantoprazole (Protonix) intravenous solution  40 mg Intravenous Daily    vancomycin  1,500 mg Intravenous Q12H       PRN medications:  acetaminophen, dextrose in water, glucagon, glucose, ipratropium-albuterol, ondansetron **OR** ondansetron    Data/Imaging Review: Reviewed in Epic and personally interpreted on 03/29/2023. See EMR for detailed results.

## 2023-03-30 LAB — COMPREHENSIVE METABOLIC PANEL
ALBUMIN: 1.8 g/dL — ABNORMAL LOW (ref 3.4–5.0)
ALKALINE PHOSPHATASE: 78 U/L (ref 46–116)
ALT (SGPT): 344 U/L — ABNORMAL HIGH (ref 10–49)
ANION GAP: 11 mmol/L (ref 5–14)
AST (SGOT): 207 U/L — ABNORMAL HIGH (ref <=34)
BILIRUBIN TOTAL: 0.4 mg/dL (ref 0.3–1.2)
BLOOD UREA NITROGEN: 17 mg/dL (ref 9–23)
BUN / CREAT RATIO: 52
CALCIUM: 8.2 mg/dL — ABNORMAL LOW (ref 8.7–10.4)
CHLORIDE: 107 mmol/L (ref 98–107)
CO2: 20.8 mmol/L (ref 20.0–31.0)
CREATININE: 0.33 mg/dL — ABNORMAL LOW
EGFR CKD-EPI (2021) MALE: >90 mL/min/{1.73_m2} (ref >=60)
GLUCOSE RANDOM: 212 mg/dL — ABNORMAL HIGH (ref 70–179)
POTASSIUM: 3.6 mmol/L (ref 3.4–4.8)
PROTEIN TOTAL: 5.4 g/dL — ABNORMAL LOW (ref 5.7–8.2)
SODIUM: 139 mmol/L (ref 135–145)

## 2023-03-30 LAB — BLOOD GAS CRITICAL CARE PANEL, VENOUS
BASE EXCESS VENOUS: -2 (ref -2.0–2.0)
CALCIUM IONIZED VENOUS (MG/DL): 4.58 mg/dL (ref 4.40–5.40)
FIO2 VENOUS: 60
GLUCOSE WHOLE BLOOD: 214 mg/dL
HCO3 VENOUS: 23 mmol/L (ref 22–27)
HEMOGLOBIN BLOOD GAS: 8.5 g/dL — ABNORMAL LOW (ref 13.50–17.50)
LACTATE BLOOD VENOUS: 0.8 mmol/L (ref 0.5–1.8)
O2 SATURATION VENOUS: 95.6 % — ABNORMAL HIGH (ref 40.0–85.0)
PCO2 VENOUS: 32 mmHg — ABNORMAL LOW (ref 40–60)
PH VENOUS: 7.45 — ABNORMAL HIGH (ref 7.32–7.43)
PO2 VENOUS: 87 mmHg — ABNORMAL HIGH (ref 35–40)
POTASSIUM WHOLE BLOOD: 3.5 mmol/L (ref 3.4–4.6)
SODIUM WHOLE BLOOD: 140 mmol/L (ref 135–145)

## 2023-03-30 LAB — CBC
HEMATOCRIT: 24.2 % — ABNORMAL LOW (ref 39.0–48.0)
HEMOGLOBIN: 8.3 g/dL — ABNORMAL LOW (ref 12.9–16.5)
MEAN CORPUSCULAR HEMOGLOBIN CONC: 34.3 g/dL (ref 32.0–36.0)
MEAN CORPUSCULAR HEMOGLOBIN: 33.8 pg — ABNORMAL HIGH (ref 25.9–32.4)
MEAN CORPUSCULAR VOLUME: 98.6 fL — ABNORMAL HIGH (ref 77.6–95.7)
MEAN PLATELET VOLUME: 9.3 fL (ref 6.8–10.7)
PLATELET COUNT: 15 10*9/L — ABNORMAL LOW (ref 150–450)
RED BLOOD CELL COUNT: 2.45 10*12/L — ABNORMAL LOW (ref 4.26–5.60)
RED CELL DISTRIBUTION WIDTH: 16.4 % — ABNORMAL HIGH (ref 12.2–15.2)
WBC ADJUSTED: 1.4 10*9/L — ABNORMAL LOW (ref 3.6–11.2)

## 2023-03-30 LAB — MAGNESIUM: MAGNESIUM: 1.7 mg/dL (ref 1.6–2.6)

## 2023-03-30 MED ADMIN — pantoprazole (Protonix) injection 40 mg: 40 mg | INTRAVENOUS | @ 13:00:00

## 2023-03-30 MED ADMIN — morphine injection 1 mg: 1 mg | INTRAVENOUS | @ 20:00:00 | Stop: 2023-04-13

## 2023-03-30 MED ADMIN — metroNIDAZOLE (FLAGYL) IVPB 500 mg: 500 mg | INTRAVENOUS | @ 20:00:00 | Stop: 2023-03-31

## 2023-03-30 MED ADMIN — dexAMETHasone (DECADRON) 4 mg/mL injection 8 mg: 8 mg | INTRAVENOUS | @ 13:00:00

## 2023-03-30 MED ADMIN — cefepime (MAXIPIME) 2 g in sodium chloride 0.9 % (NS) 100 mL IVPB-MBP: 2 g | INTRAVENOUS | @ 09:00:00 | Stop: 2023-04-04

## 2023-03-30 MED ADMIN — dexAMETHasone (DECADRON) 4 mg/mL injection 8 mg: 8 mg | INTRAVENOUS

## 2023-03-30 MED ADMIN — arformoterol (BROVANA) nebulizer solution 15 mcg/2 mL: 15 ug | RESPIRATORY_TRACT | @ 12:00:00

## 2023-03-30 MED ADMIN — cefepime (MAXIPIME) 2 g in sodium chloride 0.9 % (NS) 100 mL IVPB-MBP: 2 g | INTRAVENOUS | @ 02:00:00 | Stop: 2023-04-04

## 2023-03-30 MED ADMIN — cefepime (MAXIPIME) 2 g in sodium chloride 0.9 % (NS) 100 mL IVPB-MBP: 2 g | INTRAVENOUS | @ 20:00:00 | Stop: 2023-04-04

## 2023-03-30 MED ADMIN — ipratropium-albuterol (DUO-NEB) 0.5-2.5 mg/3 mL nebulizer solution 3 mL: 3 mL | RESPIRATORY_TRACT | @ 15:00:00

## 2023-03-30 MED ADMIN — levETIRAcetam (KEPPRA) injection 500 mg: 500 mg | INTRAVENOUS | @ 13:00:00

## 2023-03-30 MED ADMIN — insulin glargine (LANTUS) injection 6 Units: 6 [IU] | SUBCUTANEOUS | @ 02:00:00

## 2023-03-30 MED ADMIN — levETIRAcetam (KEPPRA) injection 500 mg: 500 mg | INTRAVENOUS

## 2023-03-30 MED ADMIN — nicotine (NICODERM CQ) 21 mg/24 hr patch 1 patch: 1 | TRANSDERMAL | @ 13:00:00

## 2023-03-30 MED ADMIN — metroNIDAZOLE (FLAGYL) IVPB 500 mg: 500 mg | INTRAVENOUS | @ 10:00:00 | Stop: 2023-03-31

## 2023-03-30 MED ADMIN — vancomycin (VANCOCIN) 1500 mg in sodium chloride (NS) 0.9% 500 mL IVPB: 1500 mg | INTRAVENOUS | @ 07:00:00

## 2023-03-30 MED ADMIN — ipratropium-albuterol (DUO-NEB) 0.5-2.5 mg/3 mL nebulizer solution 3 mL: 3 mL | RESPIRATORY_TRACT | @ 20:00:00

## 2023-03-30 MED ADMIN — vancomycin (VANCOCIN) 1500 mg in sodium chloride (NS) 0.9% 500 mL IVPB: 1500 mg | INTRAVENOUS | @ 22:00:00

## 2023-03-30 MED ADMIN — metroNIDAZOLE (FLAGYL) IVPB 500 mg: 500 mg | INTRAVENOUS | @ 03:00:00 | Stop: 2023-03-31

## 2023-03-30 MED ADMIN — ipratropium-albuterol (DUO-NEB) 0.5-2.5 mg/3 mL nebulizer solution 3 mL: 3 mL | RESPIRATORY_TRACT | @ 12:00:00

## 2023-03-30 NOTE — Unmapped (Signed)
Problem: Self-Care Deficit  Goal: Improved Ability to Complete Activities of Daily Living  Outcome: Ongoing - Unchanged     Problem: Adult Inpatient Plan of Care  Goal: Plan of Care Review  Outcome: Progressing  Goal: Patient-Specific Goal (Individualized)  Outcome: Progressing  Goal: Absence of Hospital-Acquired Illness or Injury  Outcome: Progressing  Intervention: Identify and Manage Fall Risk  Recent Flowsheet Documentation  Taken 03/29/2023 0800 by Johnsie Cancel, RN  Safety Interventions:   aspiration precautions   bleeding precautions   commode/urinal/bedpan at bedside   fall reduction program maintained   lighting adjusted for tasks/safety   nonskid shoes/slippers when out of bed  Intervention: Prevent Skin Injury  Recent Flowsheet Documentation  Taken 03/29/2023 1800 by Johnsie Cancel, RN  Positioning for Skin: Left  Taken 03/29/2023 1600 by Johnsie Cancel, RN  Positioning for Skin: Right  Taken 03/29/2023 1400 by Johnsie Cancel, RN  Positioning for Skin: Left  Taken 03/29/2023 1200 by Johnsie Cancel, RN  Positioning for Skin: Right  Taken 03/29/2023 1000 by Johnsie Cancel, RN  Positioning for Skin: Left  Taken 03/29/2023 0800 by Johnsie Cancel, RN  Positioning for Skin: Right  Device Skin Pressure Protection:   absorbent pad utilized/changed   positioning supports utilized  Skin Protection:   cleansing with dimethicone incontinence wipes   incontinence pads utilized   tubing/devices free from skin contact  Goal: Optimal Comfort and Wellbeing  Outcome: Progressing  Goal: Readiness for Transition of Care  Outcome: Progressing  Goal: Rounds/Family Conference  Outcome: Progressing     Problem: Fall Injury Risk  Goal: Absence of Fall and Fall-Related Injury  Outcome: Progressing  Intervention: Promote Injury-Free Environment  Recent Flowsheet Documentation  Taken 03/29/2023 0800 by Johnsie Cancel, RN  Safety Interventions:   aspiration precautions   bleeding precautions commode/urinal/bedpan at bedside   fall reduction program maintained   lighting adjusted for tasks/safety   nonskid shoes/slippers when out of bed     Problem: Skin Injury Risk Increased  Goal: Skin Health and Integrity  Outcome: Progressing  Intervention: Optimize Skin Protection  Recent Flowsheet Documentation  Taken 03/29/2023 1800 by Johnsie Cancel, RN  Pressure Reduction Techniques:   frequent weight shift encouraged   heels elevated off bed  Taken 03/29/2023 1600 by Johnsie Cancel, RN  Pressure Reduction Techniques:   frequent weight shift encouraged   heels elevated off bed   weight shift assistance provided  Taken 03/29/2023 1400 by Johnsie Cancel, RN  Pressure Reduction Techniques:   frequent weight shift encouraged   heels elevated off bed   weight shift assistance provided  Taken 03/29/2023 1200 by Johnsie Cancel, RN  Pressure Reduction Techniques:   frequent weight shift encouraged   heels elevated off bed   weight shift assistance provided  Taken 03/29/2023 1000 by Johnsie Cancel, RN  Pressure Reduction Techniques:   frequent weight shift encouraged   heels elevated off bed   weight shift assistance provided  Taken 03/29/2023 0800 by Johnsie Cancel, RN  Activity Management: bedrest  Pressure Reduction Techniques:   frequent weight shift encouraged   heels elevated off bed   weight shift assistance provided  Head of Bed (HOB) Positioning: HOB at 30 degrees  Pressure Reduction Devices:   positioning supports utilized   pressure-redistributing mattress utilized  Skin Protection:   cleansing with dimethicone incontinence wipes   incontinence pads utilized   tubing/devices free from skin contact  Problem: VTE (Venous Thromboembolism)  Goal: Tissue Perfusion  Outcome: Progressing  Intervention: Optimize Tissue Perfusion  Recent Flowsheet Documentation  Taken 03/29/2023 0800 by Johnsie Cancel, RN  Bleeding Precautions:   blood pressure closely monitored   monitored for signs of bleeding  Goal: Right Ventricular Function  Outcome: Progressing     Problem: Gas Exchange Impaired  Goal: Optimal Gas Exchange  Outcome: Progressing  Intervention: Optimize Oxygenation and Ventilation  Recent Flowsheet Documentation  Taken 03/29/2023 0800 by Johnsie Cancel, RN  Head of Bed [facility]) Positioning: HOB at 30 degrees     Problem: Infection  Goal: Absence of Infection Signs and Symptoms  Outcome: Progressing     Problem: Comorbidity Management  Goal: Maintenance of COPD Symptom Control  Outcome: Progressing  Goal: Blood Glucose Levels Within Targeted Range  Outcome: Progressing

## 2023-03-30 NOTE — Unmapped (Signed)
MICU History & Physical     Date of Service: 03/30/2023    Problem List:   Principal Problem:    Sepsis with acute hypoxic respiratory failure and septic shock (CMS-HCC)  Active Problems:    Primary CNS lymphoma (CMS-HCC)    COPD (chronic obstructive pulmonary disease) (CMS-HCC)    Anxiety and depression    BPH (benign prostatic hyperplasia)    Diabetes mellitus (CMS-HCC)    History of chimeric antigen receptor T-cell therapy    Pancytopenia (CMS-HCC)    Acute hypoxemic respiratory failure (CMS-HCC)    Acute pulmonary embolism (CMS-HCC)    Large bowel obstruction (CMS-HCC)  Resolved Problems:    * No resolved hospital problems. *      HPI: Andre Duncan is a 57 y.o. 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  He was subsequently admitted to the ICU.    24hr events: Continues with substantial hypoxia.  Failed trial of LBNC today.  Pt is quite ill appearing and taking rapid shallow breaths.  Suspect he may be nearing the end of his life.  May be reasonable to focus on more comfort oriented measures.  Do not think going home is an achievable goal at this time.      Neurological     Acute metabolic encephalopathy with Delirium: Multifocal in cause, including known DLBCL, known cerebral edema, acute metabolic/toxic encephalopathy, and hypoxemic respiratory failure.  -Currently receiving dexamethasone 8 mg twice daily (IV).  Was previously receiving 6 mg in the morning and 4 mg in the evening.      History of seizures: Continue Keppra 500 mg twice daily  Cognitive difficulties: Can restart memantine when able to tolerate oral meds  Mood disorder: Restart home Lexapro when able to tolerate oral meds    Analgesia: Pain adequately controlled  RASS at goal? N/A, not on sedation  [place] Agitation Assessment Scale (RASS) : 0 (03/30/2023  8:00 AM)       Pulmonary     COPD 2E with radiographic emphysema   Active tobacco use disorder  Multifocal pneumonia  New cavitary lung lesions  New nonocclusive left pulmonary artery embolism  Acute hypoxemic respiratory failure    CT angiography of his chest demonstrated acute nonocclusive lobar PE in the left pulmonary artery.  This also demonstrated diffuse bronchocentric groundglass opacities concerning for aspiration pneumonia.  He does have 2 new separate cavitary lesions within the lingula, additionally.  No documented history of tuberculosis.  Given rapid appearance of cavitary lesions, favor likely bacterial etiology of lesions rather than fungal or mycobacterial... Although he is at high risk of atypical infections given his immunosuppression  -For now, would continue holding anticoagulation in the setting of significant thrombocytopenia  -DuoNebs, every 4 hours scheduled.  Arformoterol 15 mcg twice daily.  -Receiving equivalent of approximately ~100 mg of prednisone daily (8 mg dexamethasone IV twice daily).  Can hold off on inhaled budesonide for the moment  -Favor SpO2 goal 88 to 90%; as much use of BiPAP as he can tolerate, and high flow otherwise  -Antibiotics as noted below  -Nicotine patch, 21 mg      FiO2 (%):  [40 %-83 %] 40 %  O2 Device: BiPAP  O2 Flow Rate (L/min):  [  10 L/min-60 L/min] 12 L/min    Cardiovascular     Hyperlipidemia: Can hold Zetia for the moment    Renal     AKI with hyperkalemia, now resolved: Presented with creatinine of 0.75, potassium of 5.1.  Baseline creatinine appears to be between 0.4 and 0.6.  This morning creatinine is 0.33.      Infectious Disease/Autoimmune     Multifocal pneumonia with cavitary lung lesions  Sepsis with febrile neutropenia    Given his aspiration events, and lung imaging, the most likely source of his sepsis is pneumonia. Currently on Vanc/ Zoysn.  Agree with cont for now.  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  His radiographic imaging appears to be more consistent with multifocal, predominantly bibasilar bacterial/aspiration pneumonia rather than COVID-pneumonia.  I believe this is probably persistent viral shedding in the setting of significant immunosuppression  -Can consider COVID directed therapeutics if his clinical symptoms were to change, but would hold off on remdesivir/tocilizumab for now.    Prophylactic medications   -Can restart Diflucan and valacyclovir when able to tolerate oral meds      Cultures:  Blood Culture, Routine (no units)   Date Value   03/28/2023 No Growth at 24 hours   03/28/2023 No Growth at 24 hours     Urine Culture, Comprehensive (no units)   Date Value   03/28/2023 NO GROWTH     WBC (10*9/L)   Date Value   03/30/2023 1.4 (L)     WBC, UA (/HPF)   Date Value   03/28/2023 8 (H)              FEN/GI/Urinary     Developing small bowel obstruction: Luckily, he is passing gas and had a small BM today. ADAT    GERD: Continue Protonix 40 mg daily  Malignancy associated anorexia: Can restart Marinol/Mirtazapine when he is given a diet  BPH: Can restart finasteride when able to tolerate oral meds (foley currently in place)    Provider Malnutrition Assessment:  Body mass index is 18.47 kg/m??.  GLIM criteria:   Patient is younger than 55 with BMI <20 -- Non-Severe Malnutrition  -I have screened this patient for malnutrition and they did meet criteria for malnutrition based on GLIM criteria.  -Nutrition consulted no  RD assessment:Not done yet.           Heme/Coag     CNS DLBCL  Pancytopenia    He had DLBCL diagnosed 05/2020.  He was given methotrexate, cytarabine, thiotepa and rituximab from 06/25/2020 to 08/25/2020 with improvement in his mass size.  He had relapse in 08/2021 with a seizure at that time.  He received 5 fractions of CyberKnife completed December 2022.  He was given ibrutinib from 09/24/2021 to 06/25/2022.  He had improvement in his brain imaging during this treatment, but had progressive disease noted 07/2022.  Was given cycles 3 with lenalidomide and obinutuzumab with disease progression subsequently.  He then was given CAR-T therapy as part of a phase 1 study for DLBCL after lymphocyte depletion.  This was performed 09/26/2022.  He subsequently had progression of disease 01/2023 with significant edema in his brain, and was addition to hospice 03/19/23.  He last saw his cancer physician on 03/20/2023.  At that time, he was receiving high doses of dexamethasone.  -Recommend continuing  dexamethasone 8 mg twice daily as you are doing, we will plan to taper in the next few days  -Restart temozolomide when able to tolerate oral meds    Endocrine     T2DM: Presented glucoses were quite high with mild anion gap noted.  Anion gap is now resolved and glucoses are improving, now down to 240 mg/dL this morning.  Last A1c 5.6.  -Continue insulin glargine 6 units nightly; low threshold to add on sliding scale insulin  -Hold home metformin       Integumentary   #  - WOCN consulted for high risk skin assessment Yes.  - WOCN recs >> pending   - cont pressure mitigating precautions per skin policy    Prophylaxis/LDA/Restraints/Consults   ICU Checklist completed: yes (see ICU rounding navigator in Epic)      Patient Lines/Drains/Airways Status       Active Active Lines, Drains, & Airways       Name Placement date Placement time Site Days    Urethral Catheter Temperature probe;Straight-tip 16 Fr. 03/28/23  1455  Temperature probe;Straight-tip  1    Peripheral IV 03/28/23 Anterior;Distal;Left;Lower Saphenous 03/28/23  1416  Saphenous  1    Peripheral IV 03/28/23 Left Antecubital 03/28/23  1830  Antecubital  1 Patient Lines/Drains/Airways Status       Active Wounds       None                    Goals of Care     Code Status: DNR/DNI    Designated Healthcare Decision Maker:  Mr. Fariss current decisional capacity for healthcare decision-making is Full capacity. His designated healthcare decision maker(s) is/are   HCDM (patient stated preference): [name] - Spouse - [phone].      Subjective     INITIAL HPI:    Regarding his malignancy history, he had DLBCL diagnosed 05/2020.  He was given methotrexate, cytarabine, thiotepa and rituximab from 06/25/2020 to 08/25/2020 with improvement in his mass size.  He had relapse in 08/2021 with a seizure at that time.  He received 5 fractions of CyberKnife completed December 2022.  He was given ibrutinib from 09/24/2021 to 06/25/2022.  He had improvement in his brain imaging during this treatment, but had progressive disease noted 07/2022.  Was given cycles 3 with lenalidomide and obinutuzumab with disease progression subsequently.  He then was given CAR-T therapy as part of a phase 1 study for DLBCL after lymphocyte depletion.  This was performed 09/26/2022.  He subsequently had progression of disease 01/2023 with significant edema in his brain, and was addition to hospice 03/19/23.  He last saw his cancer physician on 03/20/2023.  At that time, he was receiving high doses of dexamethasone.    He presented to the emergency department yesterday 03/28/23 due to shortness of breath.  He was brought in by EMS, who found him hypoxemic at home, and started CPAP.  He was diagnosed with multifocal pneumonia and admitted to the ICU on continuous BiPAP.  His initial lactate was 6.4.  CTA of his chest did demonstrate acute nonocclusive lobar pulmonary emboli in the left pulmonary artery with segmental extension of the left upper lobe and left lower lobes.  He also had 2 separate cavitary lesions within the lingula with irregular thick walls.  CT abdomen/pelvis demonstrated a partial/early Large bowel obstruction with herniation of the distal descending colon into a left inguinal hernia.    His wife tells me that he has been feeling  ill for roughly the last week.  He has been coughing up sputum, and then had a significant aspiration event yesterday morning.  She found a good amount of black/brown vomitus on his gown yesterday morning, and he seemed to worsen significantly.  He was febrile when he came to the emergency department, but she did not otherwise note fevers for him over the last week.  He has been feeling more rundown over the last week.  He has been smoking around 2 packs/day.  His last bowel movement was on Thursday, and his abdomen has been getting more distended.  He has been passing gas.  He currently uses DuoNebs, and Combivent at home.  He also uses Bevespi.  He does not use oxygen at home.    She tells me that his dexamethasone is being weaned.  He was most recently on 6 mg in the morning and 4 mg in the evening.  Plan was to drop to 4 mg in the morning and 4 mg in the evening tomorrow, and then continue cutting by 2 mg daily until down to 2 mg twice daily.  He has had some falls recently, last around 3 weeks ago.  He has significant bruising given his falls and thrombocytopenia.    Regarding his COPD, he does follow with [facility] pulmonary clinic.  It appears that he was diagnosed in 2013 2014.  Denies history of asthma.  He has abnormal PFTs, DLCO 45%.  Of note, he has tested positive for COVID on oh 09/24/2022, 10/04/2022, 10/17/2022, 10/31/2022, 01/18/2023, 01/19/2023, and again 03/28/2023.        ROS: Review of systems was obtained from his wife as the patient is altered    Allergies  Allergies   Allergen Reactions    Sulfa (Sulfonamide Antibiotics) Rash     Developed during hospitalization d/c     Tegaderm Adhesive-No Drug-Allergy Check Other (See Comments)       Meds  No current facility-administered medications on file prior to encounter.     Current Outpatient Medications on File Prior to Encounter   Medication Sig    acetaminophen (TYLENOL EXTRA STRENGTH) 500 MG tablet Take 1 tablet (500 mg total) by mouth every six (6) hours as needed for pain. (Patient not taking: Reported on 02/13/2023)    albuterol HFA 90 mcg/actuation inhaler Inhale 2 puffs every six (6) hours as needed for wheezing or shortness of breath. With Spacer    aluminum-magnesium hydroxide (MAALOX) 200-200 mg/5 mL suspension Take by mouth every six (6) hours as needed for indigestion. (Patient not taking: Reported on 02/13/2023)    blood sugar diagnostic (ACCU-CHEK GUIDE TEST STRIPS) Strp Use as instructed. Check blood sugar four times a day (before meals and at bedtime).    blood-glucose meter kit Use as instructed. Check blood sugar four times a day (before meals and at bedtime).    cetirizine (ZYRTEC) 10 MG tablet Take 1 tablet (10 mg total) by mouth nightly.    cholecalciferol, vitamin D3-50 mcg, 2,000 unit,, 50 mcg (2,000 unit) tablet Take 1 tablet (50 mcg total) by mouth daily.    dexAMETHasone (DECADRON) 4 MG tablet Take 2 tablets (8 mg total) by mouth every twelve (12) hours.    docusate sodium (COLACE) 100 MG capsule Take 1 capsule (100 mg total) by mouth two (2) times a day.    dronabinol (MARINOL) 5 MG capsule Take 1 capsule (5 mg total) by mouth Two (2) times a day (30 minutes before a meal).  escitalopram oxalate (LEXAPRO) 20 MG tablet Take 1 tablet (20 mg total) by mouth daily.    ezetimibe (ZETIA) 10 mg tablet Take 10 mg by mouth daily. Not taking (Patient not taking: Reported on 02/13/2023)    finasteride (PROSCAR) 5 mg tablet Take 1 tablet (5 mg total) by mouth daily.    fluconazole (DIFLUCAN) 200 MG tablet Take 2 tablets daily while on steroids    fluconazole (DIFLUCAN) 200 MG tablet Take 1 tablet (200 mg total) by mouth daily.    fluoride, sodium, 1.1 % Gel Brush teeth with a pea-sized amount of the paste for 2 min and spit.  No rinsing. Do not eat or drink for 30 minutes after use. Use twice daily    insulin glargine (BASAGLAR, LANTUS) 100 unit/mL (3 mL) injection pen Inject 0.17 mL (17 Units total) under the skin nightly.    insulin lispro (HUMALOG KWIKPEN INSULIN) 100 unit/mL injection pen     ipratropium-albuterol (DUO-NEB) 0.5-2.5 mg/3 mL nebulizer Inhale 3 mL by nebulization every six (6) hours as needed (wheezing, shortness of breath).    lancets (ACCU-CHEK SOFTCLIX LANCETS) Misc Use as instructed. Check blood sugar four times a day (before meals and at bedtime).    levETIRAcetam (KEPPRA) 500 MG tablet Take 1 tablet (500 mg total) by mouth two (2) times a day.    memantine (NAMENDA) 10 MG tablet Take 1 tablet (10 mg total) by mouth two (2) times a day.    metFORMIN (GLUCOPHAGE) 500 MG tablet Take 2 tablets (1,000 mg total) by mouth in the morning and 2 tablets (1,000 mg total) in the evening. Take with meals. (Patient taking differently: Take 1 tablet (500 mg total) by mouth in the morning and 1 tablet (500 mg total) in the evening. Take with meals.)    mirtazapine (REMERON) 30 MG tablet Take 1 tablet (30 mg total) by mouth nightly.    multivitamin (TAB-A-VITE/THERAGRAN) per tablet Take 1 tablet by mouth daily.    nicotine (NICODERM CQ) 21 mg/24 hr patch Place 1 patch on the skin and change daily.    ondansetron (ZOFRAN) 8 MG tablet Take 1 tablet (8 mg total) by mouth every eight (8) hours as needed for nausea. (Patient not taking: Reported on 02/13/2023)    pantoprazole (PROTONIX) 40 MG tablet Take 1 tablet (40 mg total) by mouth two (2) times a day.    pen needle, diabetic (UNIFINE PENTIPS) 32 gauge x 1/4 (6 mm) Ndle Use as instructed. Check blood sugar four times a day (before meals and at bedtime).    senna-docusate (PERICOLACE) 8.6-50 mg Take 1 tablet by mouth nightly.    temozolomide (TEMODAR) 20 mg capsule Take 2 capsules (40 mg total) by mouth daily  with 1 other temozolomide prescription for 50 mg total.    temozolomide (TEMODAR) 5 mg capsule Take 2 capsules (10 mg total) by mouth daily  with 1 other temozolomide prescription for 50 mg total.    thiamine (B-1) 100 MG tablet Take 1 tablet (100 mg total) by mouth daily.    valACYclovir (VALTREX) 500 MG tablet Take 1 tablet (500 mg total) by mouth two (2) times a day.       Past Medical History  Past Medical History:   Diagnosis Date    Anxiety     Cancer (CMS-HCC)     COPD (chronic obstructive pulmonary disease) (CMS-HCC)     Cytarabine poisoning     Depression     Diabetes mellitus (CMS-HCC)  Pneumonia, aspiration (CMS-HCC)     Prostate atrophy        Past Surgical History  Past Surgical History:   Procedure Laterality Date    CRANIOTOMY  05/06/2020       Family History  Family History   Problem Relation Age of Onset    Cancer Mother     Diabetes Mother     Cancer Father        Social History  Social History     Socioeconomic History    Marital status: Married     Spouse name: [name]   Tobacco Use    Smoking status: Former     Current packs/day: 0.00     Average packs/day: 2.0 packs/day for 30.0 years (60.0 ttl pk-yrs)     Types: Cigarettes     Quit date: 09/2022     Years since quitting: 0.5    Smokeless tobacco: Never    Tobacco comments:     Quit 09-24-2022; previously 2ppd   Vaping Use    Vaping status: Never Used   Substance and Sexual Activity    Alcohol use: Not Currently     Comment: rarely    Drug use: Never    Sexual activity: Yes     Partners: Female   Social History Narrative    Lives with spouse     Social Determinants of Health     Financial Resource Strain: Low Risk  (10/22/2022)    Overall Financial Resource Strain (CARDIA)     Difficulty of Paying Living Expenses: Not very hard   Food Insecurity: No Food Insecurity (10/22/2022)    Hunger Vital Sign     Worried About Running Out of Food in the Last Year: Never true     Ran Out of Food in the Last Year: Never true   Transportation Needs: No Transportation Needs (10/22/2022)    PRAPARE - Transportation     Lack of Transportation (Medical): No     Lack of Transportation (Non-Medical): No   Stress: Stress Concern Present (11/05/2020)    Received from [facility] - Occupational Stress Questionnaire     Feeling of Stress : Rather much    Received from [employer]    Social Network           Objective     Vitals - past 24 hours  Temp:  [36.6 ??C (97.9 ??F)-36.9 ??C (98.5 ??F)] 36.9 ??C (98.5 ??F)  Heart Rate:  [80-108] 90  SpO2 Pulse:  [83-109] 83  Resp:  [22-38] 33  BP: (105-144)/(65-88) 118/72  FiO2 (%):  [40 %-83 %] 40 %  SpO2:  [84 %-98 %] 97 % Intake/Output  I/O last 3 completed shifts:  In: 600 [IV Piggyback:600]  Out: 2770 [Urine:2770]     Physical Exam:    General: Appears ill, appears in minor distress  HEENT: High flow nasal cannula present, poor dentition  CV: Tachycardia, normal S1/S2  Pulm: Significantly coarse breath sounds bilaterally, increased WOB.  GI: Distended abdomen, mildly tender diffusely, no point tenderness, easily reducible hernia  MSK: Cachectic with low muscle mass  Skin: No peripheral edema noted, multiple sites of bruising  Neuro: Confused, conversant, tired.  Moves all extremities, CAM +      Body mass index is 18.47 kg/m??.            Wt Readings from Last 12 Encounters:   03/29/23 63.5 kg (140 lb)   02/27/23 74.5  kg (164 lb 3.9 oz)   02/13/23 75.5 kg (166 lb 7.2 oz)   01/30/23 78.5 kg (173 lb 1 oz)   01/21/23 78.9 kg (174 lb)   01/16/23 78.9 kg (174 lb)   12/26/22 78 kg (171 lb 15.3 oz)   12/24/22 76.6 kg (168 lb 12.8 oz)   12/17/22 75.5 kg (166 lb 6.4 oz)   12/05/22 73.5 kg (162 lb 0.6 oz)   12/03/22 71.6 kg (157 lb 13.6 oz)   11/21/22 70.7 kg (155 lb 13.8 oz)       Continuous Infusions:       Scheduled Medications:    arformoterol  15 mcg Nebulization BID (RT)    cefepime  2 g Intravenous Q8H    dexAMETHasone  8 mg Intravenous Q12H    insulin glargine  6 Units Subcutaneous Nightly    ipratropium-albuterol  3 mL Nebulization Q4H (RT)    levETIRAcetam  500 mg Intravenous Q12H SCH    metroNIDAZOLE  500 mg Intravenous Q8H    nicotine  1 patch Transdermal Daily    pantoprazole (Protonix) intravenous solution  40 mg Intravenous Daily    vancomycin  1,500 mg Intravenous Q12H       PRN medications:  acetaminophen, dextrose in water, glucagon, glucose, ipratropium-albuterol, ondansetron **OR** ondansetron    Data/Imaging Review: Reviewed in Epic and personally interpreted on 03/30/2023. See EMR for detailed results.    The patient is critically ill with the above illnesses. I personally spent 50 minutes, excluding procedures, in critical care time examining the patient, evaluating the hemodynamic, laboratory, and radiographic data, developing a comprehensive management plan, and serially assessing the patient's response to our critical care interventions.    Verneita Griffes, MD

## 2023-03-30 NOTE — Unmapped (Signed)
Patient placed on Bipap due to desaturation to 84%on 12 large bore per a Chat with MD Eliberto Ivory. SPO2 increased to 97% on FIO2 of 60%

## 2023-03-30 NOTE — Unmapped (Signed)
03/30/23 1230   Vitals   Heart Rate 94   Resp (!) 31   SpO2 (!) 84 %   SpO2 Sensor Type Disposable Sensor   Oxygen Therapy/Pulse Ox   O2 Device Large bore nasal cannula (cool high flow)   O2 Therapy Oxygen humidified   O2 Flow Rate (L/min) 12 L/min

## 2023-03-30 NOTE — Unmapped (Signed)
03/30/23 1115   Vitals   Heart Rate 97   Resp 30   SpO2 95 %   Oxygen Therapy/Pulse Ox   O2 Device Large bore nasal cannula (cool high flow)   O2 Therapy Oxygen humidified   O2 Flow Rate (L/min) 10 L/min   $$ O2 per day charge Yes   FiO2 (%) 60 %   $$ Pulse Oximetry Charges Continuous   ROX Index   ROX Index Score 5.28   Respiratory   Respiratory (WDL) X   Respiratory Pattern Regular;Shallow;Tachypneic   Chest Assessment Chest expansion symmetrical   Bilateral Breath Sounds Diminished   R Breath Sounds All Fields   Cough Non-productive   Sputum Description   Sputum Amount None   Sputum Color None   Sputum Consistency None     Per MD Eliberto Ivory, changed to Large bore Nasal cannula and given breathing treatments at this time

## 2023-03-30 NOTE — Unmapped (Signed)
Problem: Adult Inpatient Plan of Care  Goal: Plan of Care Review  Outcome: Ongoing - Unchanged  Goal: Patient-Specific Goal (Individualized)  Outcome: Ongoing - Unchanged  Goal: Absence of Hospital-Acquired Illness or Injury  Outcome: Ongoing - Unchanged  Intervention: Prevent Skin Injury  Recent Flowsheet Documentation  Taken 03/30/2023 0200 by Unk Lightning, RN ADN  Positioning for Skin: Left  Taken 03/30/2023 0000 by Unk Lightning, RN ADN  Positioning for Skin: Right  Taken 03/29/2023 2200 by Unk Lightning, RN ADN  Positioning for Skin: Left  Taken 03/29/2023 2000 by Unk Lightning, RN ADN  Positioning for Skin: Right  Taken 03/29/2023 1930 by Unk Lightning, RN ADN  Positioning for Skin: Right  Device Skin Pressure Protection: absorbent pad utilized/changed  Skin Protection: cleansing with dimethicone incontinence wipes  Goal: Optimal Comfort and Wellbeing  Outcome: Ongoing - Unchanged  Goal: Readiness for Transition of Care  Outcome: Ongoing - Unchanged  Goal: Rounds/Family Conference  Outcome: Ongoing - Unchanged

## 2023-03-30 NOTE — Unmapped (Signed)
ADVANCE CARE PLANNING NOTE    Discussion Date:  March 29, 2023    Patient has decisional capacity:  No    Patient has selected a Health Care Decision-Maker if loses capacity: Yes    Health Care Decision Maker as of 03/29/2023    HCDM (patient stated preference): Phineas Real - [phone]    Discussion Participants:  HCDM, Patient, myself    Communication of Medical Status/Prognosis:   We first reviewed their recent transition to Hospice and goals of care before this admission. [name] shared that they just had the intake appointment when all of this happened, they didn't have any medications at home or anything yet. They are not focused anymore on trying to extend life but instead hoping to focus on being as comfortable as possible for whatever time he has left. Getting suddenly very sick was not what they expected, but this has not changed the overall goal of being home with hospice for whatever may happen. We discussed that right now he is critically ill with multifocal pneumonia, blood clots, and partial bowel obstruction vs ileus. I shared that I worried he might not get better enough to get home, based on how sick he is right now.     Communication of Treatment Goals/Options:   We discussed continuing current treatments (steroids, home medicines) and IV antibiotics with NIPPV support as needed for a time to see if we can get him well enough to get home.   We discussed transitioning at some point to inpatient hospice if he gets well enough for that but not well enough to get home.  We discussed transitioning at some point to a fully comfort-focused approach while here in the hospital.   We also discussed risks and benefits of treating blood clots as well as the risks and benefits of allowing him to eat and drink if he wants.     Treatment Decisions:   [name] would like to try antibiotics and breathing support with a mask for a time -- we discussed a day or two and continually reassessing how he seems to be doing -- with the goal of getting him well enough to go home with hospice.   We agreed not to treat with anticoagulation for the blood clots.   We agreed that he should be allowed to eat and drink if he wants.   We will not use morphine for air hunger right now, but would reconsider if coming hours to days, depending on how he seems to be doing.   [name] mentioned she was expecting a package from hospice with medicines that could be used for comfort but that they had not received that yet or any training on that yet.         I spent 30 minutes providing voluntary advance care planning services for this patient.

## 2023-03-30 NOTE — Unmapped (Signed)
Hospital Medicine Daily Progress Note    Assessment/Plan:    Principal Problem:    Sepsis with acute hypoxic respiratory failure and septic shock (CMS-HCC)  Active Problems:    Primary CNS lymphoma (CMS-HCC)    COPD (chronic obstructive pulmonary disease) (CMS-HCC)    Anxiety and depression    BPH (benign prostatic hyperplasia)    Diabetes mellitus (CMS-HCC)    History of chimeric antigen receptor T-cell therapy    Pancytopenia (CMS-HCC)    Acute hypoxemic respiratory failure (CMS-HCC)    Acute pulmonary embolism (CMS-HCC)    Large bowel obstruction (CMS-HCC)  Resolved Problems:    * No resolved hospital problems. *                 Andre Duncan is a 57 y.o. male that presented to [facility] with Sepsis with acute hypoxic respiratory failure and septic shock (CMS-HCC).    Goals of care: Patient and his family are primarily focused on quality of remaining life, however long or short that may be, rather than quantity of life/extending life. Their initial hope has been that he might improve enough for him to be able to go back home with hospice.Unfortunately, the patient has not demonstrated significant improvement or stability since hospitalization with aggressive treatment with IV antibiotics and respiratory support with Bipap/HFNC. Discussed case with intensivist, and unlikely the patient will improve to where he could be discharged home with his symptoms managed and suspects that patient is actively dying at this time. He reported that the patient's main respiratory requirement is not the oxygenation primarily however it is the flow rate given the patient's weakness in addition to his respiratory disease. Lengthy discussion with the patient's wife, and counseled her that the patient is demonstrating evidence of active dying process and given his complex condition and critical illness it is unlikely he will improve. Recommended to change focus of care fully to comfort   - Agrees with plan to transition to pursuing comfort measures only either in the hospital or with inpatient hospice  - Will stop additional work up with additional labs or imaging  - Will stop some medications not geared towards comfort - however she wishes to continue IV antibiotics for now overnight but expresses understanding that unlikely helping as he has worsened despite them. Plan to discontinue in the AM if continues to not have improvement  - Discussed with CM - and refer to inpatient hospice at [facility]  - Will start IV morphine low dose at 1 mg PRN for air hunger - wife aware that this may lead to decreased mental state  - Will change code status to DNR-comfort given discussions with wife - and no plans to escalate care with plans to likely change to full CMO tomorrow. Will discuss with wife if further decompensation sooner to change all orders to CMO and increase symptomatic medications     Sepsis with acute hypoxic respiratory failure and septic shock I Acute left lobar pulmonary embolus I Multifocal pneumonia probably due to aspiration: Unfortunately, the patient's clinical status remains critical at this time with worsened O2 requirement of HFNC up to 80% FiO2 and 60 L. Patient was trialed on [facility] which he tolerated for about 2.5 hours but then he desatted with tachypnea and required rescue Bipap. Discussed with RN and RT, and patient does not tolerate Bipap well and requests to have it removed when his mental status improves. Initially while on Bipap, patient appeared quite lethargic with heavy reliance of positive pressure  for respiratory status.  - Goals of care discussions as above  - Continue IV cefepime, vancomycin, and flagyl for now - however likely discontinue in the AM as no improvement  - Follow up cultures  - Continue respiratory support with HFNC and Bipap for comfort - discussed briefly with hospice and they can provider HFNC however do not have Bipap available.   - Continue to allow PO intake for comfort  - Upon discussions with wife, she agrees with no anticoagulation given GOC and severe thrombocytopenia     Concern for partial LBO: Consulted on by surgery on presentation, and they do not suspect LBO from easily reducible hernia. No current evidence of obstruction on current exam. BM charted today.  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  - Continue to monitor, however GOC are consistent with comfort     Primary CNS lymphoma I History of chimeric antigen receptor T-cell therapy  Detailed history available in recent oncology notes (last 03/20/2023). Recent partial response to therapy on MRI brain, but cognitive and functional status has remained poor. Patient was transitioned to home hospice earlier this month as an outpatient.  - Continue dexamethasone 8 mg q12 hours - and plan to continue at current dose for end of life for comfort  - Continue levetiracetam for seizure ppx   - Will stop prophylaxis with valacyclovir and pentamidine given GOC     Pancytopenia: Leukopenia, thrombocytopenia have been present within last month and likely due to underlying disease and multiple therapies received for same. Likely worsened by sepsis  - Continue to monitor, however GOC are consistent with comfort     COPD (chronic obstructive pulmonary disease)   - Will trial scheduled neb treatments per pulmonary recs      Anxiety and depression  - Continue to hold SSRI at this point given difficulty taking PO and goals changing to comfort     BPH (benign prostatic hyperplasia)  - Continue to hold finasteride at this point given difficulty taking PO and goals changing to comfort  - Continue Foley given critical illness with transitioning to comfort measures     Diabetes mellitus with hyperglycemia  - Will stop monitoring pOC glucose and lab values  - Stop insulin given goals changing to comfort - and discussed with wife     Nutrition: Regular/House.  Allow patient to po ad lib for comfort ; discussed risks of aspiration and worsening bowel function with family.    DVT ppx: Chemical ppx contraindicated given thrombocytopenia - and will not order SCDs given GOC focusing on comfort    Code Status: DNR and DNI - and will add comfort to code status. Wife accepts no escalation of care at this time - and plan to transition to full comfort measures only likely in the AM with cessation of IV antibiotics.     HCDM:   HCDM (patient stated preference): [name] - Spouse - [phone]    Dispo: High change of dying while inpatient vs inpatient hospice possibly tomorrow    The patient is critically ill with the above illnesses as this illness acutely impairs one or more vital organ systems such as that there is a high probability of imminent or life threatening deterioration in the patient???s condition without proper intervention. I utilized high complexity decision making to assess, manipulate,  and support vital system function(s) to treat single or multiple vital organ system failure and/or prevent further life threatening deterioration of the patient???s condition. I personally spent 65 minutes, excluding procedures, in critical care time examining the patient, evaluating the hemodynamic, laboratory, and radiographic data, developing a comprehensive management plan, and serially assessing the patient's response to our critical care interventions.  ___________________________________________________________________    Subjective:  Patient seen and examined at bedside in the AM. No acute events overnight. Bedside RN reports the patient desatted while undergoing treatment with respiratory, and his HFNC settings had to be increased to 80% FiO2 and 60 L. Patietn took about 5 minutes to recover in terms of his O2 saturation. Patient reports he is feeling good. Patient reports he is having SOB, and states it is the same as yesterday. Patient reports he is coughing, and he is producing mucus that is thick, tan, and occasionally streaks of blood. Denies fever, chills, CP, nausea, vomiting, abdominal pain. Patient cannot recall when his last BM was, but denies diarrhea.     Labs/Studies:  Labs and Studies from the last 24hrs per EMR and Reviewed  Labs significant for: WBC 1.4, H/H 8.3/24.2, platelets 15, Na 139, BUN/Cr 17/0.33, glucose 212, Mg 1.7, albumin 1.8, ALT/AST 344/207, VBG 7.45/32/87/23, lactic acid 0.8.    Objective:  Temp:  [36.6 ??C (97.9 ??F)-37.2 ??C (98.9 ??F)] 36.6 ??C (97.9 ??F)  Heart Rate:  [86-111] 108  SpO2 Pulse:  [86-111] 106  Resp:  [22-55] 22  BP: (103-144)/(65-88) 144/84  FiO2 (%):  [60 %-80 %] 60 %  SpO2:  [87 %-98 %] 90 %    GEN: NAD, lying in bed, speaking in full statements  CV: RRR, normal S1/S2  PULM: Decreased BS anteriorly, no wheeze, no increased WOB  ABD: soft, NT/ND, +BS  EXT: No pitting edema

## 2023-03-31 ENCOUNTER — Inpatient Hospital Stay: Admit: 2023-03-31 | Discharge: 2023-04-04 | Payer: PRIVATE HEALTH INSURANCE

## 2023-03-31 ENCOUNTER — Ambulatory Visit
Admit: 2023-03-31 | Discharge: 2023-04-06 | Disposition: E | Payer: PRIVATE HEALTH INSURANCE | Source: Other Acute Inpatient Hospital

## 2023-03-31 ENCOUNTER — Encounter: Admit: 2023-03-31 | Discharge: 2023-04-04 | Payer: PRIVATE HEALTH INSURANCE

## 2023-03-31 DIAGNOSIS — C8589 Other specified types of non-Hodgkin lymphoma, extranodal and solid organ sites: Principal | ICD-10-CM

## 2023-03-31 MED ADMIN — metroNIDAZOLE (FLAGYL) IVPB 500 mg: 500 mg | INTRAVENOUS | @ 14:00:00 | Stop: 2023-03-31

## 2023-03-31 MED ADMIN — levETIRAcetam (KEPPRA) injection 500 mg: 500 mg | INTRAVENOUS

## 2023-03-31 MED ADMIN — ipratropium-albuterol (DUO-NEB) 0.5-2.5 mg/3 mL nebulizer solution 3 mL: 3 mL | RESPIRATORY_TRACT | @ 12:00:00 | Stop: 2023-03-31

## 2023-03-31 MED ADMIN — vancomycin (VANCOCIN) 1500 mg in sodium chloride (NS) 0.9% 500 mL IVPB: 1500 mg | INTRAVENOUS | @ 18:00:00 | Stop: 2023-03-31

## 2023-03-31 MED ADMIN — levETIRAcetam (KEPPRA) injection 500 mg: 500 mg | INTRAVENOUS | @ 13:00:00 | Stop: 2023-03-31

## 2023-03-31 MED ADMIN — arformoterol (BROVANA) nebulizer solution 15 mcg/2 mL: 15 ug | RESPIRATORY_TRACT | @ 13:00:00 | Stop: 2023-03-31

## 2023-03-31 MED ADMIN — ipratropium-albuterol (DUO-NEB) 0.5-2.5 mg/3 mL nebulizer solution 3 mL: 3 mL | RESPIRATORY_TRACT | @ 09:00:00 | Stop: 2023-03-31

## 2023-03-31 MED ADMIN — cefepime (MAXIPIME) 2 g in sodium chloride 0.9 % (NS) 100 mL IVPB-MBP: 2 g | INTRAVENOUS | @ 01:00:00 | Stop: 2023-04-04

## 2023-03-31 MED ADMIN — ipratropium-albuterol (DUO-NEB) 0.5-2.5 mg/3 mL nebulizer solution 3 mL: 3 mL | RESPIRATORY_TRACT

## 2023-03-31 MED ADMIN — metroNIDAZOLE (FLAGYL) IVPB 500 mg: 500 mg | INTRAVENOUS | @ 11:00:00 | Stop: 2023-03-31

## 2023-03-31 MED ADMIN — metroNIDAZOLE (FLAGYL) IVPB 500 mg: 500 mg | INTRAVENOUS | @ 02:00:00 | Stop: 2023-03-31

## 2023-03-31 MED ADMIN — cefepime (MAXIPIME) 2 g in sodium chloride 0.9 % (NS) 100 mL IVPB-MBP: 2 g | INTRAVENOUS | @ 18:00:00 | Stop: 2023-03-31

## 2023-03-31 MED ADMIN — dexAMETHasone (DECADRON) 4 mg/mL injection 8 mg: 8 mg | INTRAVENOUS

## 2023-03-31 MED ADMIN — ipratropium-albuterol (DUO-NEB) 0.5-2.5 mg/3 mL nebulizer solution 3 mL: 3 mL | RESPIRATORY_TRACT | @ 04:00:00

## 2023-03-31 MED ADMIN — cefepime (MAXIPIME) 2 g in sodium chloride 0.9 % (NS) 100 mL IVPB-MBP: 2 g | INTRAVENOUS | @ 10:00:00 | Stop: 2023-03-31

## 2023-03-31 MED ADMIN — nicotine (NICODERM CQ) 21 mg/24 hr patch 1 patch: 1 | TRANSDERMAL | @ 13:00:00 | Stop: 2023-03-31

## 2023-03-31 MED ADMIN — vancomycin (VANCOCIN) 1500 mg in sodium chloride (NS) 0.9% 500 mL IVPB: 1500 mg | INTRAVENOUS | @ 08:00:00 | Stop: 2023-03-31

## 2023-03-31 MED ADMIN — pantoprazole (Protonix) injection 40 mg: 40 mg | INTRAVENOUS | @ 13:00:00 | Stop: 2023-03-31

## 2023-03-31 MED ADMIN — furosemide (LASIX) injection 40 mg: 40 mg | INTRAVENOUS | @ 18:00:00 | Stop: 2023-03-31

## 2023-03-31 MED ADMIN — arformoterol (BROVANA) nebulizer solution 15 mcg/2 mL: 15 ug | RESPIRATORY_TRACT

## 2023-03-31 MED ADMIN — ipratropium-albuterol (DUO-NEB) 0.5-2.5 mg/3 mL nebulizer solution 3 mL: 3 mL | RESPIRATORY_TRACT | @ 16:00:00 | Stop: 2023-03-31

## 2023-03-31 MED ADMIN — dexAMETHasone (DECADRON) 4 mg/mL injection 8 mg: 8 mg | INTRAVENOUS | @ 13:00:00 | Stop: 2023-03-31

## 2023-03-31 MED ADMIN — ipratropium-albuterol (DUO-NEB) 0.5-2.5 mg/3 mL nebulizer solution 3 mL: 3 mL | RESPIRATORY_TRACT | @ 19:00:00 | Stop: 2023-03-31

## 2023-03-31 NOTE — Unmapped (Signed)
MICU History & Physical     Date of Service: 03/17/2023    Problem List:   Principal Problem:    Sepsis with acute hypoxic respiratory failure and septic shock (CMS-HCC)  Active Problems:    Primary CNS lymphoma (CMS-HCC)    COPD (chronic obstructive pulmonary disease) (CMS-HCC)    Anxiety and depression    BPH (benign prostatic hyperplasia)    Diabetes mellitus (CMS-HCC)    History of chimeric antigen receptor T-cell therapy    Pancytopenia (CMS-HCC)    Acute hypoxemic respiratory failure (CMS-HCC)    Acute pulmonary embolism (CMS-HCC)    Large bowel obstruction (CMS-HCC)  Resolved Problems:    * No resolved hospital problems. *      HPI: [name] is a 57 y.o. 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  He was subsequently admitted to the ICU.    24hr events: Continues with substantial hypoxia and SOB.  Transitioning to inpatient hospice.      Would make the following recs to increase comfort:  -Lasix 40mg  IV once for diuresis  -Fan to bedside for dyspnea relief  -PRN morphine 1-2mg  prn at least every 4 hours for dyspnea  -Would advise stop Abx.  Unlikely to add to quality of life and may only extend quantity of life by a day or so.  -TID oral care  -Bowel regimen to ensure daily BM      Goals of Care     Code Status: DNR/DNI    Designated Healthcare Decision Maker:  [name] current decisional capacity for healthcare decision-making is Full capacity. His designated healthcare decision maker(s) is/are   HCDM (patient stated preference): [name] - Spouse - [phone].      Subjective     INITIAL HPI:    Regarding his malignancy history, he had DLBCL diagnosed 05/2020.  He was given methotrexate, cytarabine, thiotepa and rituximab from 06/25/2020 to 08/25/2020 with improvement in his mass size.  He had relapse in 08/2021 with a seizure at that time.  He received 5 fractions of CyberKnife completed December 2022.  He was given ibrutinib from 09/24/2021 to 06/25/2022.  He had improvement in his brain imaging during this treatment, but had progressive disease noted 07/2022.  Was given cycles 3 with lenalidomide and obinutuzumab with disease progression subsequently.  He then was given CAR-T therapy as part of a phase 1 study for DLBCL after lymphocyte depletion.  This was performed 09/26/2022.  He subsequently had progression of disease 01/2023 with significant edema in his brain, and was addition to hospice 03/19/23.  He last saw his cancer physician on 03/20/2023.  At that time, he was receiving high doses of dexamethasone.    He presented to the emergency department yesterday 03/28/23 due to shortness of breath.  He was brought in by EMS, who found him hypoxemic at home, and started CPAP.  He was diagnosed with multifocal pneumonia and admitted to the ICU on continuous BiPAP.  His initial lactate was 6.4.  CTA of his chest did demonstrate acute nonocclusive lobar pulmonary emboli in the left pulmonary artery with segmental extension of the left upper lobe and left lower lobes.  He also had 2 separate cavitary lesions within the lingula with irregular  thick walls.  CT abdomen/pelvis demonstrated a partial/early Large bowel obstruction with herniation of the distal descending colon into a left inguinal hernia.    His wife tells me that he has been feeling ill for roughly the last week.  He has been coughing up sputum, and then had a significant aspiration event yesterday morning.  She found a good amount of black/brown vomitus on his gown yesterday morning, and he seemed to worsen significantly.  He was febrile when he came to the emergency department, but she did not otherwise note fevers for him over the last week.  He has been feeling more rundown over the last week.  He has been smoking around 2 packs/day.  His last bowel movement was on Thursday, and his abdomen has been getting more distended.  He has been passing gas.  He currently uses DuoNebs, and Combivent at home.  He also uses Bevespi.  He does not use oxygen at home.    She tells me that his dexamethasone is being weaned.  He was most recently on 6 mg in the morning and 4 mg in the evening.  Plan was to drop to 4 mg in the morning and 4 mg in the evening tomorrow, and then continue cutting by 2 mg daily until down to 2 mg twice daily.  He has had some falls recently, last around 3 weeks ago.  He has significant bruising given his falls and thrombocytopenia.    Regarding his COPD, he does follow with [facility] pulmonary clinic.  It appears that he was diagnosed in 2013 2014.  Denies history of asthma.  He has abnormal PFTs, DLCO 45%.  Of note, he has tested positive for COVID on [place] 09/24/2022, 10/04/2022, 10/17/2022, 10/31/2022, 01/18/2023, 01/19/2023, and again 03/28/2023.        ROS: Review of systems was obtained from his wife as the patient is altered    Allergies  Allergies   Allergen Reactions    Sulfa (Sulfonamide Antibiotics) Rash     Developed during hospitalization d/c     Tegaderm Adhesive-No Drug-Allergy Check Other (See Comments)       Meds  No current facility-administered medications on file prior to encounter.     Current Outpatient Medications on File Prior to Encounter   Medication Sig    acetaminophen (TYLENOL EXTRA STRENGTH) 500 MG tablet Take 1 tablet (500 mg total) by mouth every six (6) hours as needed for pain. (Patient not taking: Reported on 02/13/2023)    albuterol HFA 90 mcg/actuation inhaler Inhale 2 puffs every six (6) hours as needed for wheezing or shortness of breath. With Spacer    aluminum-magnesium hydroxide (MAALOX) 200-200 mg/5 mL suspension Take by mouth every six (6) hours as needed for indigestion. (Patient not taking: Reported on 02/13/2023)    blood sugar diagnostic (ACCU-CHEK GUIDE TEST STRIPS) Strp Use as instructed. Check blood sugar four times a day (before meals and at bedtime).    blood-glucose meter kit Use as instructed. Check blood sugar four times a day (before meals and at bedtime).    cetirizine (ZYRTEC) 10 MG tablet Take 1 tablet (10 mg total) by mouth nightly.    cholecalciferol, vitamin D3-50 mcg, 2,000 unit,, 50 mcg (2,000 unit) tablet Take 1 tablet (50 mcg total) by mouth daily.    dexAMETHasone (DECADRON) 4 MG tablet Take 2 tablets (8 mg total) by mouth every twelve (12) hours.    docusate sodium (COLACE) 100 MG capsule Take 1 capsule (100 mg total) by  mouth two (2) times a day.    dronabinol (MARINOL) 5 MG capsule Take 1 capsule (5 mg total) by mouth Two (2) times a day (30 minutes before a meal).    escitalopram oxalate (LEXAPRO) 20 MG tablet Take 1 tablet (20 mg total) by mouth daily.    ezetimibe (ZETIA) 10 mg tablet Take 10 mg by mouth daily. Not taking (Patient not taking: Reported on 02/13/2023)    finasteride (PROSCAR) 5 mg tablet Take 1 tablet (5 mg total) by mouth daily.    fluconazole (DIFLUCAN) 200 MG tablet Take 2 tablets daily while on steroids    fluconazole (DIFLUCAN) 200 MG tablet Take 1 tablet (200 mg total) by mouth daily.    fluoride, sodium, 1.1 % Gel Brush teeth with a pea-sized amount of the paste for 2 min and spit.  No rinsing. Do not eat or drink for 30 minutes after use. Use twice daily    insulin glargine (BASAGLAR, LANTUS) 100 unit/mL (3 mL) injection pen Inject 0.17 mL (17 Units total) under the skin nightly.    insulin lispro (HUMALOG KWIKPEN INSULIN) 100 unit/mL injection pen     ipratropium-albuterol (DUO-NEB) 0.5-2.5 mg/3 mL nebulizer Inhale 3 mL by nebulization every six (6) hours as needed (wheezing, shortness of breath).    lancets (ACCU-CHEK SOFTCLIX LANCETS) Misc Use as instructed. Check blood sugar four times a day (before meals and at bedtime). levETIRAcetam (KEPPRA) 500 MG tablet Take 1 tablet (500 mg total) by mouth two (2) times a day.    memantine (NAMENDA) 10 MG tablet Take 1 tablet (10 mg total) by mouth two (2) times a day.    metFORMIN (GLUCOPHAGE) 500 MG tablet Take 2 tablets (1,000 mg total) by mouth in the morning and 2 tablets (1,000 mg total) in the evening. Take with meals. (Patient taking differently: Take 1 tablet (500 mg total) by mouth in the morning and 1 tablet (500 mg total) in the evening. Take with meals.)    mirtazapine (REMERON) 30 MG tablet Take 1 tablet (30 mg total) by mouth nightly.    multivitamin (TAB-A-VITE/THERAGRAN) per tablet Take 1 tablet by mouth daily.    nicotine (NICODERM CQ) 21 mg/24 hr patch Place 1 patch on the skin and change daily.    ondansetron (ZOFRAN) 8 MG tablet Take 1 tablet (8 mg total) by mouth every eight (8) hours as needed for nausea. (Patient not taking: Reported on 02/13/2023)    pantoprazole (PROTONIX) 40 MG tablet Take 1 tablet (40 mg total) by mouth two (2) times a day.    pen needle, diabetic (UNIFINE PENTIPS) 32 gauge x 1/4 (6 mm) Ndle Use as instructed. Check blood sugar four times a day (before meals and at bedtime).    senna-docusate (PERICOLACE) 8.6-50 mg Take 1 tablet by mouth nightly.    temozolomide (TEMODAR) 20 mg capsule Take 2 capsules (40 mg total) by mouth daily  with 1 other temozolomide prescription for 50 mg total.    temozolomide (TEMODAR) 5 mg capsule Take 2 capsules (10 mg total) by mouth daily  with 1 other temozolomide prescription for 50 mg total.    thiamine (B-1) 100 MG tablet Take 1 tablet (100 mg total) by mouth daily.    valACYclovir (VALTREX) 500 MG tablet Take 1 tablet (500 mg total) by mouth two (2) times a day.       Past Medical History  Past Medical History:   Diagnosis Date    Anxiety     Cancer (CMS-HCC)  COPD (chronic obstructive pulmonary disease) (CMS-HCC)     Cytarabine poisoning     Depression     Diabetes mellitus (CMS-HCC)     Pneumonia, aspiration (CMS-HCC)     Prostate atrophy        Past Surgical History  Past Surgical History:   Procedure Laterality Date    CRANIOTOMY  05/06/2020       Family History  Family History   Problem Relation Age of Onset    Cancer Mother     Diabetes Mother     Cancer Father        Social History  Social History     Socioeconomic History    Marital status: Married     Spouse name: [name]   Tobacco Use    Smoking status: Former     Current packs/day: 0.00     Average packs/day: 2.0 packs/day for 30.0 years (60.0 ttl pk-yrs)     Types: Cigarettes     Quit date: 09/2022     Years since quitting: 0.5    Smokeless tobacco: Never    Tobacco comments:     Quit 09-24-2022; previously 2ppd   Vaping Use    Vaping status: Never Used   Substance and Sexual Activity    Alcohol use: Not Currently     Comment: rarely    Drug use: Never    Sexual activity: Yes     Partners: Female   Social History Narrative    Lives with spouse     Social Determinants of Health     Financial Resource Strain: Low Risk  (10/22/2022)    Overall Financial Resource Strain (CARDIA)     Difficulty of Paying Living Expenses: Not very hard   Food Insecurity: No Food Insecurity (10/22/2022)    Hunger Vital Sign     Worried About Running Out of Food in the Last Year: Never true     Ran Out of Food in the Last Year: Never true   Transportation Needs: No Transportation Needs (10/22/2022)    PRAPARE - Transportation     Lack of Transportation (Medical): No     Lack of Transportation (Non-Medical): No   Stress: Stress Concern Present (11/05/2020)    Received from [facility] Occupational [facility] - Occupational Stress Questionnaire     Feeling of Stress : Rather much    Received from [employer]    Social Network           Objective     Vitals - past 24 hours  Temp:  [36.5 ??C (97.7 ??F)-36.6 ??C (97.9 ??F)] 36.6 ??C (97.9 ??F)  Heart Rate:  [69-94] 91  SpO2 Pulse:  [69-92] 91  Resp:  [24-35] 35  BP: (110-125)/(67-83) 115/74  FiO2 (%):  [40 %-100 %] 100 %  SpO2: [84 %-100 %] 96 % Intake/Output  I/O last 3 completed shifts:  In: 1120 [P.O.:120; IV Piggyback:1000]  Out: 1800 [Urine:1800]     Physical Exam:    General: Appears ill, appears in minor distress  HEENT: High flow nasal cannula present, poor dentition  CV: Tachycardia, normal S1/S2  Pulm: Significantly coarse breath sounds bilaterally, increased WOB.  GI: Distended abdomen, mildly tender diffusely, no point tenderness, easily reducible hernia  MSK: Cachectic with low muscle mass  Skin: No peripheral edema noted, multiple sites of bruising  Neuro: Confused, conversant, tired.  Moves all extremities, CAM +      Body mass index is 18.47 kg/m??.  Wt Readings from Last 12 Encounters:   03/29/23 63.5 kg (140 lb)   02/27/23 74.5 kg (164 lb 3.9 oz)   02/13/23 75.5 kg (166 lb 7.2 oz)   01/30/23 78.5 kg (173 lb 1 oz)   01/21/23 78.9 kg (174 lb)   01/16/23 78.9 kg (174 lb)   12/26/22 78 kg (171 lb 15.3 oz)   12/24/22 76.6 kg (168 lb 12.8 oz)   12/17/22 75.5 kg (166 lb 6.4 oz)   12/05/22 73.5 kg (162 lb 0.6 oz)   12/03/22 71.6 kg (157 lb 13.6 oz)   11/21/22 70.7 kg (155 lb 13.8 oz)       Continuous Infusions:       Scheduled Medications:    arformoterol  15 mcg Nebulization BID (RT)    cefepime  2 g Intravenous Q8H    dexAMETHasone  8 mg Intravenous Q12H    ipratropium-albuterol  3 mL Nebulization Q4H (RT)    levETIRAcetam  500 mg Intravenous Q12H SCH    metroNIDAZOLE  500 mg Intravenous Q8H    nicotine  1 patch Transdermal Daily    pantoprazole (Protonix) intravenous solution  40 mg Intravenous Daily    vancomycin  1,500 mg Intravenous Q12H       PRN medications:  dextrose in water, glucagon, glucose, ipratropium-albuterol, morphine, ondansetron **OR** ondansetron, oxyCODONE    Data/Imaging Review: Reviewed in Epic and personally interpreted on 03/27/2023. See EMR for detailed results.    The patient is critically ill with the above illnesses. I personally spent 35 minutes, excluding procedures, in critical care time examining the patient, evaluating the hemodynamic, laboratory, and radiographic data, developing a comprehensive management plan, and serially assessing the patient's response to our critical care interventions.    Verneita Griffes, MD

## 2023-03-31 NOTE — Unmapped (Signed)
Patient is ICU status. Alert and oriented x2 (self and location). MD had a POC conversation regarding Hospice with patient and wife. A decision was made with patient/ wife regarding inpatient hospice via [facility] (see CM note). Per MD orders given scheduled abx. IV Lasix administered and pt voided1500 mls.    Called report to Flight Watch to Madelaine Bhat and Andi Devon, Charity fundraiser at [facility]. Wife at the bedside. Will continue to monitor patient.     Discharge paperwork envelope sent with flight watch and Code paperwork to Hospice. Patient belongings sent with wife and wife will drive self to inpatient Hospice.      Problem: Adult Inpatient Plan of Care  Goal: Plan of Care Review  Outcome: Ongoing - Unchanged  Goal: Patient-Specific Goal (Individualized)  Outcome: Ongoing - Unchanged  Goal: Absence of Hospital-Acquired Illness or Injury  Outcome: Ongoing - Unchanged  Intervention: Identify and Manage Fall Risk  Recent Flowsheet Documentation  Taken 03/07/2023 0800 by Guadalupe Nickless, Arby Barrette, RN  Safety Interventions:   neutropenic precautions   aspiration precautions   bleeding precautions   infection management   fall reduction program maintained   environmental modification   isolation precautions   lighting adjusted for tasks/safety   low bed   nonskid shoes/slippers when out of bed   room near unit station   toileting scheduled  Intervention: Prevent Skin Injury  Recent Flowsheet Documentation  Taken 03/09/2023 1200 by Deandre Brannan, Arby Barrette, RN  Positioning for Skin: Right  Taken 03/08/2023 1000 by Shalva Rozycki, Arby Barrette, RN  Positioning for Skin: Left  Taken 03/13/2023 0800 by Katisha Shimizu, Arby Barrette, RN  Positioning for Skin: Right  Device Skin Pressure Protection:   absorbent pad utilized/changed   tubing/devices free from skin contact   skin-to-skin areas padded   skin-to-device areas padded  Skin Protection:   adhesive use limited   tubing/devices free from skin contact   transparent dressing maintained   skin-to-skin areas padded   skin-to-device areas padded  Intervention: Prevent Infection  Recent Flowsheet Documentation  Taken 03/23/2023 0800 by Mayzie Caughlin, Arby Barrette, RN  Infection Prevention:   cohorting utilized   environmental surveillance performed   equipment surfaces disinfected   single patient room provided   rest/sleep promoted   personal protective equipment utilized   hand hygiene promoted  Goal: Optimal Comfort and Wellbeing  Outcome: Ongoing - Unchanged  Goal: Readiness for Transition of Care  Outcome: Ongoing - Unchanged  Goal: Rounds/Family Conference  Outcome: Ongoing - Unchanged     Problem: Fall Injury Risk  Goal: Absence of Fall and Fall-Related Injury  Outcome: Ongoing - Unchanged  Intervention: Promote Scientist, clinical (histocompatibility and immunogenetics) Documentation  Taken 04/02/2023 0800 by Ezzard Ditmer, Arby Barrette, RN  Safety Interventions:   neutropenic precautions   aspiration precautions   bleeding precautions   infection management   fall reduction program maintained   environmental modification   isolation precautions   lighting adjusted for tasks/safety   low bed   nonskid shoes/slippers when out of bed   room near unit station   toileting scheduled     Problem: Self-Care Deficit  Goal: Improved Ability to Complete Activities of Daily Living  Outcome: Ongoing - Unchanged     Problem: Skin Injury Risk Increased  Goal: Skin Health and Integrity  Outcome: Ongoing - Unchanged  Intervention: Optimize Skin Protection  Recent Flowsheet Documentation  Taken 04/01/2023 1200 by Arayna Illescas, Arby Barrette, RN  Activity Management: bedrest  Head of Bed (HOB) Positioning: HOB at 30-45 degrees  Taken  03/30/2023 1000 by Caden Fukushima, Arby Barrette, RN  Activity Management: bedrest  Head of Bed [facility]) Positioning: HOB at 30-45 degrees  Taken 03/22/2023 0800 by Katasha Riga, Arby Barrette, RN  Activity Management: bedrest  Pressure Reduction Techniques:   frequent weight shift encouraged   heels elevated off bed  Head of Bed (HOB) Positioning: HOB at 30-45 degrees  Pressure Reduction Devices:   heel offloading device utilized   positioning supports utilized   specialty bed utilized  Skin Protection:   adhesive use limited   tubing/devices free from skin contact   transparent dressing maintained   skin-to-skin areas padded   skin-to-device areas padded

## 2023-03-31 NOTE — Unmapped (Signed)
Problem: Adult Inpatient Plan of Care  Goal: Plan of Care Review  Outcome: Ongoing - Unchanged  Goal: Patient-Specific Goal (Individualized)  Outcome: Ongoing - Unchanged  Goal: Absence of Hospital-Acquired Illness or Injury  Outcome: Ongoing - Unchanged  Intervention: Identify and Manage Fall Risk  Recent Flowsheet Documentation  Taken 03/30/2023 0800 by Kaoru Benda, Arby Barrette, RN  Safety Interventions:   neutropenic precautions   aspiration precautions   bleeding precautions   infection management   fall reduction program maintained   environmental modification   isolation precautions   lighting adjusted for tasks/safety   low bed   nonskid shoes/slippers when out of bed   room near unit station   toileting scheduled  Intervention: Prevent Skin Injury  Recent Flowsheet Documentation  Taken 03/30/2023 0800 by Jaimey Franchini, Arby Barrette, RN  Positioning for Skin: Right  Intervention: Prevent Infection  Recent Flowsheet Documentation  Taken 03/30/2023 0800 by Fendi Meinhardt, Arby Barrette, RN  Infection Prevention:   cohorting utilized   environmental surveillance performed   equipment surfaces disinfected   single patient room provided   rest/sleep promoted   personal protective equipment utilized   hand hygiene promoted  Goal: Optimal Comfort and Wellbeing  Outcome: Ongoing - Unchanged  Goal: Readiness for Transition of Care  Outcome: Ongoing - Unchanged  Goal: Rounds/Family Conference  Outcome: Ongoing - Unchanged     Problem: Fall Injury Risk  Goal: Absence of Fall and Fall-Related Injury  Outcome: Ongoing - Unchanged  Intervention: Promote Scientist, clinical (histocompatibility and immunogenetics) Documentation  Taken 03/30/2023 0800 by Mallie Giambra, Arby Barrette, RN  Safety Interventions:   neutropenic precautions   aspiration precautions   bleeding precautions   infection management   fall reduction program maintained   environmental modification   isolation precautions   lighting adjusted for tasks/safety   low bed   nonskid shoes/slippers when out of bed   room near unit station   toileting scheduled     Problem: Self-Care Deficit  Goal: Improved Ability to Complete Activities of Daily Living  Outcome: Ongoing - Unchanged     Problem: Skin Injury Risk Increased  Goal: Skin Health and Integrity  Outcome: Ongoing - Unchanged  Intervention: Optimize Skin Protection  Recent Flowsheet Documentation  Taken 03/30/2023 0800 by Budd Freiermuth, Arby Barrette, RN  Pressure Reduction Techniques:   frequent weight shift encouraged   heels elevated off bed     Problem: VTE (Venous Thromboembolism)  Goal: Tissue Perfusion  Outcome: Ongoing - Unchanged  Intervention: Optimize Tissue Perfusion  Recent Flowsheet Documentation  Taken 03/30/2023 0800 by Viren Lebeau, Arby Barrette, RN  Bleeding Precautions:   blood pressure closely monitored   monitored for signs of bleeding   gentle oral care promoted   foot protection facilitated   coagulation study results reviewed  Goal: Right Ventricular Function  Outcome: Ongoing - Unchanged     Problem: Gas Exchange Impaired  Goal: Optimal Gas Exchange  Outcome: Ongoing - Unchanged     Problem: Infection  Goal: Absence of Infection Signs and Symptoms  Outcome: Ongoing - Unchanged  Intervention: Prevent or Manage Infection  Recent Flowsheet Documentation  Taken 03/30/2023 0800 by Gwenda Heiner, Arby Barrette, RN  Infection Management: aseptic technique maintained  Isolation Precautions:   protective precautions maintained   airborne precautions discontinued     Problem: Comorbidity Management  Goal: Maintenance of COPD Symptom Control  Outcome: Ongoing - Unchanged  Goal: Blood Glucose Levels Within Targeted Range  Outcome: Ongoing - Unchanged     Problem: Suicide Risk  Goal: Absence of Self-Harm  Outcome: Ongoing - Unchanged

## 2023-03-31 NOTE — Unmapped (Signed)
Andre Duncan is a 57 y.o. man with primary CNS lymphoma who was admitted for septic shock with acute hypoxemic respiratory failure, acute lobar PE and concern for early/partial large bowel obstruction. He was establishing care with Home Hospice when his status deteriorated. He was initially provided support with IV antibiotics and NIPPV in attempt to help him get home to pass. However, his clinical status continued to worsen. ***

## 2023-03-31 NOTE — Unmapped (Signed)
Problem: Gas Exchange Impaired  Goal: Optimal Gas Exchange  Outcome: Progressing     Patient treatments given per order. Patient on HFNC 60L/75% with sats in the mid 90's. No acute events of note at this time.

## 2023-03-31 NOTE — Unmapped (Signed)
Physician Discharge Summary HBR  2 BT1 [facility]  [address]  [place] [address]  Dept: [phone]  Loc: [phone]     Identifying Information:   Andre Duncan  10/15/1965  [id number]    Primary Care Physician: Amie Portland, FNP   Code Status: DNR and DNI    Admit Date: 03/28/2023    Discharge Date: 04/03/2023     Discharge To: Hospice Facility    Discharge Service: HBR - HBC: Hospitalist Service #2     Discharge Attending Physician: Coralee Pesa, MD    Discharge Diagnoses:  Principal Problem:    Sepsis with acute hypoxic respiratory failure and septic shock (CMS-HCC) (POA: Yes)  Active Problems:    Primary CNS lymphoma (CMS-HCC) (POA: Yes)    COPD (chronic obstructive pulmonary disease) (CMS-HCC) (POA: Yes)    Anxiety and depression (POA: Yes)    BPH (benign prostatic hyperplasia) (POA: Yes)    Diabetes mellitus (CMS-HCC) (POA: Yes)    History of chimeric antigen receptor T-cell therapy (POA: Not Applicable)    Pancytopenia (CMS-HCC) (POA: Yes)    Acute hypoxemic respiratory failure (CMS-HCC) (POA: Yes)    Acute pulmonary embolism (CMS-HCC) (POA: Yes)    Large bowel obstruction (CMS-HCC) (POA: Yes)  Resolved Problems:    * No resolved hospital problems. *      Outpatient Provider Follow Up Issues:   [ ]  Follow up with support for the patient's wife with hospice care and patient's imminent death  [ ]  Keep on dexamethasone 8 mg BID for patient comfort  [ ]  Continue HFNC for comfort    Hospital Course:   Andre Duncan is a 57 y.o. man with primary CNS lymphoma who was admitted for septic shock with acute hypoxemic respiratory failure, acute lobar PE and concern for early/partial large bowel obstruction. He was establishing care with Home Hospice when his status deteriorated. He was initially provided support with IV antibiotics and NIPPV in attempt to improve his condition where he could return to home to resume home hospice. However, his clinical status continued to worsen despite broad spectrum IV antibiotics and he continued to have high very high HFNC requirements with FiO2 80% and flow of 60 L. Multiple goals of care discussions were held with the patient's wife and the patient, and they were clear that the patient's goal was to focus on comfort and quality of life. Thus, hospice was consulted and patient was discharged to inpatient hospice on 6/25. Patient's home med of dexamethasone was being titrated as per his oncologist prior to admission, however increased dose to 8 mg BID upon admission and kept on dose upon discharge for comfort as wife noted improved mental state on high steroid dose. IV abx and NIPPV were stopped upon discharge, however patient maintained on HFNC for comfort.     Procedures:  No admission procedures for hospital encounter.  ______________________________________________________________________  Discharge Medications:     Your Medication List        STOP taking these medications      ACCU-CHEK GUIDE GLUCOSE METER Misc  Generic drug: blood-glucose meter     ACCU-CHEK GUIDE TEST STRIPS Strp  Generic drug: blood sugar diagnostic     ACCU-CHEK SOFTCLIX LANCETS lancets  Generic drug: lancets     BASAGLAR KWIKPEN U-100 INSULIN 100 unit/mL (3 mL) injection pen  Generic drug: insulin glargine     cetirizine 10 MG tablet  Commonly known as: ZYRTEC     cholecalciferol (vitamin D3-50 mcg (  2,000 unit)) 50 mcg (2,000 unit) tablet     docusate sodium 100 MG capsule  Commonly known as: COLACE     dronabinol 5 MG capsule  Commonly known as: MARINOL     escitalopram oxalate 20 MG tablet  Commonly known as: LEXAPRO     ezetimibe 10 mg tablet  Commonly known as: ZETIA     finasteride 5 mg tablet  Commonly known as: PROSCAR     fluconazole 200 MG tablet  Commonly known as: DIFLUCAN     fluoride (sodium) 1.1 % Gel     HumaLOG KwikPen Insulin 100 unit/mL injection pen  Generic drug: insulin lispro     memantine 10 MG tablet  Commonly known as: NAMENDA     metFORMIN 500 MG tablet  Commonly known as: GLUCOPHAGE     mirtazapine 30 MG tablet  Commonly known as: REMERON     multivitamin per tablet  Commonly known as: TAB-A-VITE/THERAGRAN     ondansetron 8 MG tablet  Commonly known as: ZOFRAN     STOOL SOFTENER-STIMULANT LAXAT 8.6-50 mg  Generic drug: senna-docusate     temozolomide 20 mg capsule  Commonly known as: TEMODAR     temozolomide 5 mg capsule  Commonly known as: TEMODAR     ULTICARE PEN NEEDLE 32 gauge x 1/4 (6 mm) Ndle  Generic drug: pen needle, diabetic     valACYclovir 500 MG tablet  Commonly known as: VALTREX     VITAMIN B-1 100 mg tablet  Generic drug: thiamine            START taking these medications      aluminum-magnesium hydroxide 200-200 mg/5 mL suspension  Commonly known as: MAALOX  Take by mouth every six (6) hours as needed for indigestion.     morphine 2 mg/mL Crtg  Infuse 0.5 mL (1 mg total) into a venous catheter every four (4) hours as needed for up to 5 days.     ondansetron 4 MG disintegrating tablet  Commonly known as: ZOFRAN-ODT  Take 1 tablet (4 mg total) by mouth every eight (8) hours as needed for up to 7 days.     ondansetron 4 mg/2 mL injection  Commonly known as: ZOFRAN  Infuse 2 mL (4 mg total) into a venous catheter every eight (8) hours as needed (1st line, if unable to take oral medications).     TYLENOL EXTRA STRENGTH 500 MG tablet  Generic drug: acetaminophen  Take 1 tablet (500 mg total) by mouth every six (6) hours as needed for pain.            CONTINUE taking these medications      albuterol 90 mcg/actuation inhaler  Commonly known as: PROVENTIL HFA;VENTOLIN HFA  Inhale 2 puffs every six (6) hours as needed for wheezing or shortness of breath. With Spacer     dexAMETHasone 4 MG tablet  Commonly known as: DECADRON  Take 2 tablets (8 mg total) by mouth every twelve (12) hours.     ipratropium-albuterol 0.5-2.5 mg/3 mL nebulizer  Commonly known as: DUO-NEB  Inhale 3 mL by nebulization every six (6) hours as needed (wheezing, shortness of breath).     levETIRAcetam 500 MG tablet  Commonly known as: KEPPRA  Take 1 tablet (500 mg total) by mouth two (2) times a day.     nicotine 21 mg/24 hr patch  Commonly known as: NICODERM CQ  Place 1 patch on the skin and change daily.     pantoprazole  40 MG tablet  Commonly known as: Protonix  Take 1 tablet (40 mg total) by mouth two (2) times a day.              Allergies:  Sulfa (sulfonamide antibiotics) and Tegaderm adhesive-no drug-allergy check  ______________________________________________________________________  Pending Test Results (if blank, then none):  Pending Labs       Order Current Status    Blood Culture Preliminary result    Blood Culture Preliminary result            Most Recent Labs:  All lab results last 24 hours - No results found for this or any previous visit (from the past 24 hour(s)).    Relevant Studies/Radiology (if blank, then none):  CTA Chest W Contrast    Result Date: 03/28/2023  EXAM: CTA CHEST W CONTRAST ACCESSION: [id number] UN CLINICAL INDICATION: r/o PE TECHNIQUE: Contiguous axial images were reconstructed through the chest following a single breath hold helical acquisition during the administration of intravenous contrast material. Images were reformatted in the coronal and sagittal planes. MIP slabs were also constructed. COMPARISON: Chest CT 01/21/2023. Contemporaneous CT abdomen pelvis. FINDINGS: PULMONARY ARTERIES: Nonocclusive lobar pulmonary embolus within the left pulmonary artery extending into the segmental branches of the left upper and left lower lobes. Main pulmonary artery is normal in size. HEART AND VASCULATURE: Cardiac chambers are normal in size. No pericardial effusion. Aorta is normal in caliber. LUNGS, AIRWAYS, AND PLEURA: New irregular thick walled cavitary lesion within the lingula measuring 2.4 cm (series 6 image 98) and 3.9 cm thick walled cavitary lesion also within the left upper lobe (series 6 image 80). New diffuse bronchial wall thickening and diffuse bronchocentric groundglass opacities with interlobular septal thickening. Bibasilar atelectasis. Debris within the trachea. Mild centrilobular emphysema. No pleural effusion or pneumothorax. MEDIASTINUM AND LYMPH NODES: No enlarged intrathoracic, axillary, or supraclavicular lymph nodes. Patulous fluid-filled esophagus. Increased enhancement of the esophageal wall. CHEST WALL AND BONES: Multilevel degenerative changes of the spine. UPPER ABDOMEN: Please refer to concurrent CT abdomen/pelvis for findings below the diaphragm.     Acute nonocclusive lobar pulmonary embolus in the left pulmonary artery extending to the segmental branches of the left upper lobe and left lower lobes. No right heart strain. New diffuse bronchial wall thickening, diffuse bronchocentric groundglass opacities, debris within the trachea, and wall enhancing fluid-filled esophagus suggestive of aspiration pneumonia. Two new separate cavitary lesions within the lingula with irregular thick walls with differential diagnosis considerations include fungal infection, if the patient is immunocompromised, and septic pulmonary emboli  although these are less favored. ++++++++++++++++++++ The findings of this study were discussed via telephone with DR. ANDREW P MORGAN by Dr. Veverly Fells on 03/28/2023 6:09 PM. -----------------------------------------------    ECG 12 Lead    Result Date: 03/28/2023  SINUS TACHYCARDIA OTHERWISE NORMAL ECG WHEN COMPARED WITH ECG OF 19-Jan-2023 09:18, VENT. RATE HAS INCREASED BY  55 BPM NONSPECIFIC T WAVE ABNORMALITY NOW EVIDENT IN INFERIOR LEADS T WAVE AMPLITUDE HAS INCREASED IN ANTERIOR LEADS Confirmed by Eldred Manges [phone]) on 03/28/2023 8:20:22 PM    CT Abdomen Pelvis W Contrast    Result Date: 03/28/2023  EXAM: CT ABDOMEN PELVIS W CONTRAST ACCESSION: [id number] UN CLINICAL INDICATION: 57 years old with abdominal pain  COMPARISON: CT abdomen pelvis 11/04/2022 TECHNIQUE: A helical CT scan of the abdomen and pelvis was obtained following IV contrast from the lung bases through the pubic symphysis. Images were reconstructed in the axial plane. Coronal and sagittal reformatted images were also provided for further  evaluation. FINDINGS: LOWER CHEST: Please see dedicated CT chest for characterization of findings above the diaphragm. LIVER: Normal liver contour. Diffuse hepatic steatosis. No focal liver lesions. BILIARY: The gallbladder is normal in appearance. No biliary ductal dilatation.  SPLEEN: Normal in size and contour. PANCREAS: Normal pancreatic contour.  No focal lesions.  No ductal dilation. ADRENAL GLANDS: Normal appearance of the adrenal glands. KIDNEYS/URETERS: Symmetric renal enhancement.  No hydronephrosis. Subcentimeter hypoattenuating lesion of the anterior left upper pole kidney, too small to characterize. BLADDER: Decompressed with Foley catheter. Circumferential bladder wall thickening, likely accentuated due to underdistention. REPRODUCTIVE ORGANS: Unremarkable. GI TRACT: Proximal loops of small bowel are decompressed. Distal loops of small bowel are distended up to 3.7 cm with dependently layering fluid. The colon is markedly distended with gas, with herniation of the distal descending colon into a left inguinal hernia. There is a suspected transition point as the large bowel exits the hernia (5:41), although there is a moderate amount of gas seen within more distal loops of bowel and within the rectum. The appendix is not clearly identified but there are no secondary signs of appendicitis. PERITONEUM, RETROPERITONEUM AND MESENTERY: No free air.  No ascites.  No fluid collection. LYMPH NODES: No adenopathy. VESSELS: Hepatic and portal veins are patent.  Normal caliber aorta. Atherosclerotic calcifications of the abdominal aorta and its branch vessels. Filling defect in the right common femoral vein extending proximally to the external iliac vein. The infrahepatic IVC is slitlike. BONES and SOFT TISSUES: Multilevel degenerative changes of the spine. Degenerative changes to the bilateral sacroiliac joints. Left inguinal hernia containing loop of colon, as above.     - Partial/early large bowel obstruction secondary to herniation of the distal descending colon into a left inguinal hernia. - Filling defect in the right lower extremity extending proximally to the external iliac vein, which likely represents DVT although mixing artifact cannot be completely excluded. Recommend further follow-up with right lower extremity venous duplex exam. - Additional chronic/incidental findings as described in the body of the report. - nonspecific diffuse bladder wall thickening. Recommend correlation with urinalysis. ++++++++++++++++++++ The findings of large bowel obstruction were discussed via secure epic chat (with confirmed read receipt) with DR. ANDREW P MORGAN by Dr. Konrad Dolores, MD on 03/28/2023 6:05 PM. ----------------------------------------------- ==================== MODIFIED REPORT: (03/28/2023 6:30 PM) This report has been modified from its preliminary version; you may check the prior versions of radiology report, results history link for prior report versions (if they were previously visible in Epic). -----------------------------------------------    CT Head Wo Contrast    Result Date: 03/28/2023  EXAM: Computed tomography, head or brain without contrast material. ACCESSION: [id number] UN CLINICAL INDICATION: 57 years old Male with somnolence, known intracranial cancer  COMPARISON: Brain MRI 03/18/2023 TECHNIQUE: Axial CT images of the head  from skull base to vertex without contrast. FINDINGS: Redemonstrated hypodensities, corresponding to areas of signal abnormality on the recent brain MRI. The known enhancing lesions around the right lateral ventricle are not well appreciated given lack of contrast and difference in imaging technique. Right parietal bone burr hole. There is no midline shift. There is no evidence of acute infarct. No acute intracranial hemorrhage. Global cerebral atrophy with ex vacuo dilatation of the CSF containing spaces. Multiple scattered and confluent hypodensities in the periventricular and deep white matter, nonspecific but commonly seen with small vessel ischemic changes. No fractures are evident. Small bilateral mastoid effusions and partial opacification of the maxillary sinuses, otherwise the sinuses are pneumatized.     No  acute intracranial abnormality.     XR Chest Portable    Result Date: 03/28/2023  EXAM: XR CHEST PORTABLE ACCESSION: [id number] UN CLINICAL INDICATION: Fever. TECHNIQUE: Single View AP Chest Radiograph. COMPARISON: May 3. FINDINGS: New consolidation left lower lobe with possible cavitation consistent with pneumonia or aspiration. The right lung is clear. No pleural fluid or pneumothorax.     Consolidation left lower lobe with possible cavitation as discussed.   ______________________________________________________________________  Discharge Instructions:   Activity Instructions       Activity as tolerated              Diet Instructions       Discharge diet (specify)      Discharge Nutrition Therapy: Regular            Other Instructions       Discharge instructions      I certify that based on my evaluation of this patient, this patient requires ALS transportation services and that other forms of transport are contraindicated.  Please refer to care management transitions note for transportation details.            Follow Up instructions and Outpatient Referrals     Discharge instructions          Appointments which have been scheduled for you      Apr 28, 2023 2:40 PM  (Arrive by 2:25 PM)  RETURN CONTINUITY with Amie Portland, FNP  [facility] PRIMARY [facility]ARE [address] AT [facility]) [address] [place] [address]  [phone]             ______________________________________________________________________  Discharge Day Services:  BP 113/72  - Pulse 84  - Temp 36.6 ??C (97.9 ??F)  - Resp 30  - Ht 185.4 cm (6' 0.99)  - Wt 63.5 kg (140 lb)  - SpO2 97%  - BMI 18.47 kg/m??   Pt seen on the day of discharge and determined appropriate for discharge.    Condition at Discharge: poor    Length of Discharge: I spent greater than 30 mins in the discharge of this patient.

## 2023-04-01 MED ADMIN — LORazepam (ATIVAN) injection 0.5 mg: .5 mg | INTRAVENOUS | @ 02:00:00

## 2023-04-01 MED ADMIN — morphine injection 2 mg: 2 mg | INTRAVENOUS | @ 15:00:00 | Stop: 2023-09-27

## 2023-04-01 MED ADMIN — morphine injection 2 mg: 2 mg | INTRAVENOUS | @ 12:00:00 | Stop: 2023-04-01

## 2023-04-01 MED ADMIN — ipratropium-albuterol (DUO-NEB) 0.5-2.5 mg/3 mL nebulizer solution 3 mL: 3 mL | RESPIRATORY_TRACT | @ 21:00:00

## 2023-04-01 MED ADMIN — morphine injection 2 mg: 2 mg | INTRAVENOUS | @ 11:00:00 | Stop: 2023-09-27

## 2023-04-01 MED ADMIN — LORazepam (ATIVAN) injection 1 mg: 1 mg | INTRAVENOUS | @ 11:00:00

## 2023-04-01 MED ADMIN — LORazepam (ATIVAN) injection 1 mg: 1 mg | INTRAVENOUS | @ 15:00:00

## 2023-04-01 MED ADMIN — LORazepam (ATIVAN) injection 1 mg: 1 mg | INTRAVENOUS | @ 07:00:00

## 2023-04-01 MED ADMIN — morphine injection 2 mg: 2 mg | INTRAVENOUS | @ 04:00:00 | Stop: 2023-09-27

## 2023-04-01 MED ADMIN — morphine 4 mg/mL injection 4 mg: 4 mg | INTRAVENOUS | @ 07:00:00 | Stop: 2023-09-27

## 2023-04-01 MED ADMIN — morphine injection 2 mg: 2 mg | INTRAVENOUS | @ 15:00:00 | Stop: 2023-04-01

## 2023-04-01 NOTE — Unmapped (Signed)
Specialty Medication(s): temozolomide    Andre Duncan has been dis-enrolled from the [facility] specialty pharmacy services due to medication discontinuation resulting from progression of disease. Patient transitioned to hospice care.    Additional information provided to the patient: NA    Kermit Balo, [facility]  [facility] Specialty Pharmacist

## 2023-04-01 NOTE — Unmapped (Signed)
Hospice IPU: Admission Note      Hospice admission date: 03/10/2023    Patient has the primary hospice diagnosis of primary CNS lymphoma     The patient is being admitted to General In-Patient    Patient meets GIP Criteria for the following reasons:  Pain or symptom crisis not managed by changes in treatment in the current setting or that requires frequent medication , Adjustments and monitoring , and Unmanageable respiratory distress  ------    RELEVANT HISTORY    History of the terminal illness and symptoms:  Andre Duncan is a 58 y.o. male with primary CNS lymphoma progressed through multiple lines of therapy (most recently CART 09/26/22) who was admitted to hospital on 03/28/23 after p/w dyspnea. He was hypoxemic and placed on continuous Bipap and admitted to ICU. He was found to have multifocal pneumonia and in septic shock. He was also noted to have acute pulmonary emboli as well as partial/early large bowel obstruction. He was never able to wean off HFNC and/or bipap. 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 Wife states he has been having significant dyspnea that has been heartbreaking and scary to watch. Husband was able to be alert enough to be a part of the conversation with primary team and wife. He verbalized wanting to be made comfortable and his wife wants to honor his wishes.     Wife states that he has been doing really poorly over the last month particularly the last week before admission. He was coughing up a lot of sputum, getting more confused, and having aspiration events at home. He has been mainly in bed and dependent in ADLs. Their primary oncologist was talking with them about poor prognosis and hospice in May.     He's required 4 dose of IV morphine PRN and 3 doses of IV lorazepam since arrival to unit for dyspnea and tachypnea up to RR 40s. Wife states normal RR for him is in 30s due to cranial disease.     History obtained by wife, chart review given patient's mental status      Psychosocial situation and relevant past history (medical, family, social):  Married his current wife in 2021. 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 administrator of Care:  DNR/DNI  Comfort  Discussed with: wife    ------  CURRENT STATUS  Level of current function   10% - Ambulation: Totally Bed Bound / Unable to do any work, extensive disease / Self-Care: Total care / Intake: M outh care only / Level of Conscious: Drowsy or coma    Relevant Physical Examination:  Unresponsive to voice/touch, no grimace, shallow breathing RR 30, warm throughout, radial pulses present, cyanosis and mottling in hands/feet    Laboratory and imaging data reviewed:  WBC low at 1.4, Hgb low 8.3, Plt low 15, AST elevated 207, ALT elevated 344  Reviewed CT scan readings of chest, abd/pelvis, head    ====  ASSESSMENT & PLAN:  Andre Duncan is a 57 y.o. male with progressive primary CNS lymphoma here for GIP care in setting of  acute respiratory failure likely 2/2 to aspiration and pulmonary  emboli both provoked by underlying CNS lymphoma.     #Respiratory failure / dyspnea / cough - 2/2 to likely aspiration pneumonia, pulmonary emboli. Wife states that baseline RR is in low 30s from CNS disease.   Illness that poses a threat to life or bodily function without appropriate treatment  -O2 for comfort  -Scheduled IV morphine 1 mg q4h   -IV morphine 2-4 mg q30 min PRN   -IV lorazepam 1 mg q1h prn 2nd line air hunger  -duoneb prn thick secretions or wheezing - wife says this helped with cough in hospital    #Pain - no pain behaviors currently though known possible partial SBO  -IV morphine as above    #Pancytopenia: platelets last 15k  -bleedout orders: IV dilaudid 4 mg and IV lorazepam 2 mg q5 min PRN     #COVID19 - likely continued viral shedding  -precautions  -wife aware of IPU COVID policy and given a copy    #Hx of seizures  -Discontinued home keppra  -Schedule IV lorazepam 1 mg q8h   -IV lorazepam 2 mg q73min prn seizures    #Prevention of opioid-induced constipation  -start bisacodyl supp PR daily PRN constipation    #Family support and communication  - Primary contact: [name] (wife)  - Counseling or education provided: hospice plan of care, good vs distressing wakefulness, opioids for dyspnea    See ACP Note from today for additional billable service: No    Issues Impacting Complexity of Management:  -Parenteral controlled medications: IV Morphine, IV Lorazepam, and IV Haldol    Medical Decision Making: Assessment required an independent historian, additional information obtained from family/friend, chart review, due to patient's altered mental status.    I personally spent greater than 85 minutes face-to-face and non-face-to-face in the care of this patient, which includes all pre, intra, and post visit time on the date of service.  All documented time was specific to the E/M visit and does not include any procedures that may have been performed.      Wanita Chamberlain, MD  [facility] & Palliative Care Attending

## 2023-04-01 NOTE — Unmapped (Signed)
Problem: Adult Inpatient Plan of Care  Goal: Plan of Care Review  Outcome: Patient on Comfort Care  Goal: Patient-Specific Goal (Individualized)  Outcome: Patient on Comfort Care  Goal: Absence of Hospital-Acquired Illness or Injury  Outcome: Patient on Comfort Care  Goal: Optimal Comfort and Wellbeing  Outcome: Patient on Comfort Care  Goal: Readiness for Transition of Care  Outcome: Patient on Comfort Care  Goal: Rounds/Family Conference  Outcome: Patient on Comfort Care     Problem: Hospice Infection  Goal: Interventions will prevent spread of illness and mitigate symptoms associated with infection throughout hospice stay.  Outcome: Patient on Comfort Care  Goal: Precautions will be maintained to prevent spread of illness and mitigate symptoms associated with infection during the next 24 hours.  Outcome: Patient on Comfort Care     Problem: End-of-Life Care  Goal: Comfort, Peace and Preserved Dignity  Outcome: Patient on Comfort Care     Problem: Comorbidity Management  Goal: Maintenance of COPD Symptom Control  Outcome: Patient on Comfort Care

## 2023-04-01 NOTE — Unmapped (Signed)
ADVANCE CARE PLANNING NOTE    Discussion Date:  March 31, 2023    Patient has decisional capacity:  No    Patient has selected a Health Care Decision-Maker if loses capacity: Yes    Health Care Decision Maker as of 03/21/2023    HCDM (patient stated preference): Andre Duncan - [phone]    Discussion Participants:  Patient, Andre Duncan - wife, Dr. Felipa Furnace    Communication of Medical Status/Prognosis:   I provided the patient and his wife an update on his current medical status today, and discussed that his high O2 requirement that has actually worsened over the past 24 hours is concerning despite starting his 4th day of antibiotics. Wife discussed the improvement of his mental status compared to initial presentation, and I counseled her that may be due to the increased steroids vs the antibiotics however it is his respiratory failure that has him remaining critically ill. Patient's wife confirmed she wants to focus on comfort and quality of life rather than quantity. She discussed that she understands he needs inpatient hospice, but she is reluctant as she is not ready to think of him dying. Discussed that he will not reach point to be stable enough to go home on hospice with his current medical condition and based on trajectory of condition in the hospital. Patient stated that he wanted to be comfortable and he was tired.     Communication of Treatment Goals/Options:   We discussed discharging to inpatient hospice at [facility] vs continuing current care inpatient. Counseled them that even with current care, I do not suspect outcome will change and unlikely to positively extend time or improve patient to point where home hospice would be option.     Treatment Decisions:   Patient and [name] were agreeable to discharge to inpatient hospice - and understood that would include stopping Bipap and IV antibiotics. Understand use of morphine for air hunger. Will also ensure increased support for [facility] with her grief.           I spent 50 minutes providing voluntary advance care planning services for this patient.

## 2023-04-01 NOTE — Unmapped (Signed)
Problem: Adult Inpatient Plan of Care  Goal: Plan of Care Review  Outcome: Patient on Comfort Care  Flowsheets (Taken 04/01/2023 0516)  Progress: declining  Outcome Evaluation: Newly admitted patient, GIP for symptom management of dyspnea, anxiety, and likely EOL  Plan of Care Reviewed With:   patient   spouse  Goal: Patient-Specific Goal (Individualized)  Outcome: Patient on Comfort Care  Flowsheets (Taken 04/01/2023 0516)  Patient/Family-Specific Goals (Include Timeframe): Home with hospice if possible, comfortable and dignified death  Individualized Care Needs: Oxymizer at 10LPM, likes coffee, wife has back injury  Anxieties, Fears or Concerns: Anxiety with air hunger, dyspnea, EOL  Goal: Absence of Hospital-Acquired Illness or Injury  Outcome: Patient on Comfort Care  Intervention: Identify and Manage Fall Risk  Recent Flowsheet Documentation  Taken 04/08/23 2000 by Sharol Roussel, RN  Safety Interventions:   aspiration precautions   fall reduction program maintained   family at bedside  Intervention: Prevent Skin Injury  Recent Flowsheet Documentation  Taken 04-08-2023 2000 by Sharol Roussel, RN  Positioning for Skin: Supine/Back  Device Skin Pressure Protection: positioning supports utilized  Skin Protection:   incontinence pads utilized   skin-to-skin areas padded  Intervention: Prevent Infection  Recent Flowsheet Documentation  Taken 04/08/2023 2000 by Sharol Roussel, RN  Infection Prevention:   hand hygiene promoted   personal protective equipment utilized   single patient room provided   visitors restricted/screened  Goal: Optimal Comfort and Wellbeing  Outcome: Patient on Comfort Care  Intervention: Monitor Pain and Promote Comfort  Flowsheets (Taken 04/01/2023 0516)  Pain Management Interventions:   care clustered   breathing exercises utilized   pain management plan reviewed with patient/caregiver   pillow support provided   position adjusted  Intervention: Provide Person-Centered Care  Flowsheets (Taken 04/01/2023 0516)  Trust Relationship/Rapport:   care explained   choices provided   emotional support provided   empathic listening provided   questions answered   thoughts/feelings acknowledged   reassurance provided   questions encouraged    Problem: Hospice Infection  Goal: Precautions will be maintained to prevent spread of illness and mitigate symptoms associated with infection during the next 24 hours.  Intervention: Treat patient for infection  Note: Special airborne/contact precautions maintained     Problem: Hospice Pain  Goal: Patient will achieve desired pain goal of 0/10 on verbal scale (verbal, NAPS, PAINAD) over the next 72 hours  Outcome: Patient on Comfort Care  Intervention: Assess patient for pain  Note: Pt denies pain at this time  Intervention: Treat patient for pain  Recent Flowsheet Documentation  Taken 04/01/2023 0516 by Sharol Roussel, RN  Supportive Measures:   decision-making supported   positive reinforcement provided   relaxation techniques promoted  Pain Management Interventions:   care clustered   breathing exercises utilized   pain management plan reviewed with patient/caregiver   pillow support provided   position adjusted  Sensory Stimulation Regulation:   care clustered   lighting decreased   quiet environment promoted  Environmental Support:   calm environment promoted   caregiver consistency promoted   environmental consistency promoted   personal routine supported   rest periods encouraged  Goal: The patient will achieve desired pain goal of 0/10 on verbal scale within 30 minutes of administration of pain medication or non-pharmacological interventions during the next 72 hours.  Outcome: Patient on Comfort Care  Intervention: Treat patient for pain  Recent Flowsheet Documentation  Taken 04/01/2023 0516 by Sharol Roussel, RN  Supportive Measures:   decision-making supported   positive reinforcement provided   relaxation techniques promoted  Pain Management Interventions: care clustered   breathing exercises utilized   pain management plan reviewed with patient/caregiver   pillow support provided   position adjusted  Sensory Stimulation Regulation:   care clustered   lighting decreased   quiet environment promoted  Environmental Support:   calm environment promoted   caregiver consistency promoted   environmental consistency promoted   personal routine supported   rest periods encouraged     Problem: End-of-Life Care  Goal: Comfort, Peace and Preserved Dignity  Outcome: Patient on Comfort Care  Intervention: Promote Physical Comfort  Flowsheets (Taken 04/01/2023 0516)  Sensory Stimulation Regulation:   care clustered   lighting decreased   quiet environment promoted  Airway/Ventilation Support:   comfort measures provided   cough relief provided   dyspnea relief promoted  Environmental Support:   calm environment promoted   caregiver consistency promoted   environmental consistency promoted   personal routine supported   rest periods encouraged  Intervention: Promote Peace and Maintain Dignity  Flowsheets (Taken 04/01/2023 0516)  Supportive Measures:   decision-making supported   positive reinforcement provided   relaxation techniques promoted  Spiritual Activities Assistance: affirmation provided  Intervention: Support the Grieving Process  Recent Flowsheet Documentation  Taken 04/01/2023 0516 by Sharol Roussel, RN  Family/Support System Care:   caregiver stress acknowledged   involvement promoted   presence promoted   support provided     Problem: Hospice Agitation and/or Delirium  Goal: Patient will achieve a RASS score of 0 to -3 within 60 minutes after administration of medication for agitation/ delirium.  Outcome: Patient on Comfort Care  Intervention: Treat patient for agitation and/or delirium  Recent Flowsheet Documentation  Taken 04/01/2023 0516 by Sharol Roussel, RN  Sensory Stimulation Regulation:   care clustered   lighting decreased   quiet environment promoted  Taken 03/30/2023 2000 by Sharol Roussel, RN  Reorientation Measures:   reorientation provided   familiar social contact encouraged   clock in view  Goal: Patient will exhibit a decrease or elimination of agitation and/or hallucinations as evidenced by a calm demeanor and cooperation with staff during the next 24 hours.  Outcome: Patient on Comfort Care  Intervention: Treat patient for agitation and/or delirium  Recent Flowsheet Documentation  Taken 04/01/2023 0516 by Sharol Roussel, RN  Sensory Stimulation Regulation:   care clustered   lighting decreased   quiet environment promoted  Taken 03/17/2023 2000 by Sharol Roussel, RN  Reorientation Measures:   reorientation provided   familiar social contact encouraged   clock in view     Problem: Hospice Dyspnea  Goal: The patient will maintain an effective breathing pattern as evidenced by even and unlabored respirations during the next 7 days.  Outcome: Patient on Comfort Care  Intervention: Treat patient for dyspnea  Recent Flowsheet Documentation  Taken 04/01/2023 0516 by Sharol Roussel, RN  Supportive Measures:   decision-making supported   positive reinforcement provided   relaxation techniques promoted  Family/Support System Care:   caregiver stress acknowledged   involvement promoted   presence promoted   support provided  Goal: The patient will have decreased shortness of breath as evidenced by respiratory rate of 28 or less during the next 7 days.  Outcome: Patient on Comfort Care  Intervention: Treat patient for dyspnea  Recent Flowsheet Documentation  Taken 04/01/2023 0516 by Sharol Roussel, RN  Supportive Measures:   decision-making supported   positive reinforcement provided   relaxation techniques promoted  Family/Support System Care:   caregiver stress acknowledged   involvement promoted   presence promoted   support provided  Goal: Dyspnea will be relieved or reduced within 30 minutes of administration of prn opioid as evidenced by unlabored respiratory rate of 28 breaths per minute during the next 7 days.  Outcome: Patient on Comfort Care  Intervention: Treat patient for dyspnea  Flowsheets (Taken 04/01/2023 0516)  Supportive Measures:   decision-making supported   positive reinforcement provided   relaxation techniques promoted  Family/Support System Care:   caregiver stress acknowledged   involvement promoted   presence promoted   support provided  Note: OxyMizer at 10LPM, PRN Morphine 2mg  IV X1 and 4mg  IV X1 with Ativan 1mg  IV effective for decreasing WOB, air hunger and axniety.

## 2023-04-01 NOTE — Unmapped (Signed)
Problem: Adult Inpatient Plan of Care  Goal: Plan of Care Review  Outcome: Patient on Comfort Care  Flowsheets (Taken 04/01/2023 0905)  Plan of Care Reviewed With: spouse  Goal: Patient-Specific Goal (Individualized)  Outcome: Patient on Comfort Care  Goal: Absence of Hospital-Acquired Illness or Injury  Outcome: Patient on Comfort Care  Intervention: Identify and Manage Fall Risk  Recent Flowsheet Documentation  Taken 04/01/2023 0800 by Marybelle Killings, RN  Safety Interventions:   bed alarm   aspiration precautions   family at bedside   low bed  Intervention: Prevent Skin Injury  Recent Flowsheet Documentation  Taken 04/01/2023 0800 by Marybelle Killings, RN  Device Skin Pressure Protection:   absorbent pad utilized/changed   adhesive use limited   positioning supports utilized   pressure points protected   skin-to-skin areas padded   tubing/devices free from skin contact  Skin Protection:   adhesive use limited   cleansing with dimethicone incontinence wipes   incontinence pads utilized   skin sealant/moisture barrier applied   skin-to-skin areas padded   transparent dressing maintained   tubing/devices free from skin contact   zinc oxide barrier cream  Intervention: Prevent Infection  Recent Flowsheet Documentation  Taken 04/01/2023 0800 by Marybelle Killings, RN  Infection Prevention:   cohorting utilized   environmental surveillance performed   hand hygiene promoted   personal protective equipment utilized   rest/sleep promoted  Goal: Optimal Comfort and Wellbeing  Outcome: Patient on Comfort Care  Goal: Readiness for Transition of Care  Outcome: Patient on Comfort Care  Goal: Rounds/Family Conference  Outcome: Patient on Comfort Care     Problem: Hospice Infection  Goal: Interventions will prevent spread of illness and mitigate symptoms associated with infection throughout hospice stay.  Outcome: Patient on Comfort Care  Goal: Precautions will be maintained to prevent spread of illness and mitigate symptoms associated with infection during the next 24 hours.  Outcome: Patient on Comfort Care     Problem: Hospice Pain  Goal: Patient will achieve desired pain goal of 0/10 on verbal scale (verbal, NAPS, PAINAD) over the next 72 hours  Outcome: Patient on Comfort Care  Intervention: Treat patient for pain  Recent Flowsheet Documentation  Taken 04/01/2023 0800 by Marybelle Killings, RN  Sensory Stimulation Regulation:   care clustered   lighting decreased   quiet environment promoted  Goal: The patient will achieve desired pain goal of 0/10 on verbal scale within 30 minutes of administration of pain medication or non-pharmacological interventions during the next 72 hours.  Outcome: Patient on Comfort Care  Intervention: Treat patient for pain  Recent Flowsheet Documentation  Taken 04/01/2023 0800 by Marybelle Killings, RN  Sensory Stimulation Regulation:   care clustered   lighting decreased   quiet environment promoted     Problem: End-of-Life Care  Goal: Comfort, Peace and Preserved Dignity  Outcome: Patient on Comfort Care  Intervention: Promote Physical Comfort  Recent Flowsheet Documentation  Taken 04/01/2023 0800 by Marybelle Killings, RN  Sensory Stimulation Regulation:   care clustered   lighting decreased   quiet environment promoted  Airway/Ventilation Support: comfort measures provided     Problem: Hospice Agitation and/or Delirium  Goal: Patient will achieve a RASS score of 0 to -3 within 60 minutes after administration of medication for agitation/ delirium.  Outcome: Patient on Comfort Care  Intervention: Treat patient for agitation and/or delirium  Recent Flowsheet Documentation  Taken 04/01/2023 0800 by Marybelle Killings, RN  Sensory Stimulation  Regulation:   care clustered   lighting decreased   quiet environment promoted  Reorientation Measures:   calendar in view   clock in view   familiar social contact encouraged   reorientation provided  Goal: Patient will exhibit a decrease or elimination of agitation and/or hallucinations as evidenced by a calm demeanor and cooperation with staff during the next 24 hours.  Outcome: Patient on Comfort Care  Intervention: Treat patient for agitation and/or delirium  Recent Flowsheet Documentation  Taken 04/01/2023 0800 by Marybelle Killings, RN  Sensory Stimulation Regulation:   care clustered   lighting decreased   quiet environment promoted  Reorientation Measures:   calendar in view   clock in view   familiar social contact encouraged   reorientation provided     Problem: Hospice Dyspnea  Goal: The patient will maintain an effective breathing pattern as evidenced by even and unlabored respirations during the next 7 days.  Outcome: Patient on Comfort Care  Goal: The patient will have decreased shortness of breath as evidenced by respiratory rate of 28 or less during the next 7 days.  Outcome: Patient on Comfort Care  Goal: Dyspnea will be relieved or reduced within 30 minutes of administration of prn opioid as evidenced by unlabored respiratory rate of 28 breaths per minute during the next 7 days.  Outcome: Patient on Comfort Care     Problem: Comorbidity Management  Goal: Maintenance of COPD Symptom Control  Outcome: Patient on Comfort Care     Problem: Skin Injury Risk Increased  Goal: Skin Health and Integrity  Outcome: Patient on Comfort Care  Intervention: Optimize Skin Protection  Recent Flowsheet Documentation  Taken 04/01/2023 0800 by Marybelle Killings, RN  Pressure Reduction Techniques:   heels elevated off bed   pressure points protected  Pressure Reduction Devices: positioning supports utilized  Skin Protection:   adhesive use limited   cleansing with dimethicone incontinence wipes   incontinence pads utilized   skin sealant/moisture barrier applied   skin-to-skin areas padded   transparent dressing maintained   tubing/devices free from skin contact   zinc oxide barrier cream

## 2023-04-02 MED ADMIN — LORazepam (ATIVAN) injection 1 mg: 1 mg | INTRAVENOUS | @ 01:00:00

## 2023-04-02 MED ADMIN — LORazepam (ATIVAN) injection 2 mg: 2 mg | INTRAVENOUS | @ 03:00:00

## 2023-04-02 MED ADMIN — morphine injection 1 mg: 1 mg | INTRAVENOUS | @ 01:00:00 | Stop: 2023-09-28

## 2023-04-02 MED ADMIN — morphine injection 2 mg: 2 mg | INTRAVENOUS | @ 03:00:00 | Stop: 2023-09-27

## 2023-04-06 DEATH — deceased

## 2023-10-27 DIAGNOSIS — C8339 Primary CNS lymphoma: Principal | ICD-10-CM
